# Patient Record
Sex: Female | Born: 1935 | Race: White | Hispanic: No | Smoking: Never smoker
Health system: Southern US, Community
[De-identification: ages and names within clinical notes are randomized; demographics above are authoritative.]

## PROBLEM LIST (undated history)

## (undated) DIAGNOSIS — M8580 Other specified disorders of bone density and structure, unspecified site: Secondary | ICD-10-CM

## (undated) DIAGNOSIS — C4491 Basal cell carcinoma of skin, unspecified: Secondary | ICD-10-CM

## (undated) DIAGNOSIS — C44621 Squamous cell carcinoma of skin of unspecified upper limb, including shoulder: Secondary | ICD-10-CM

## (undated) DIAGNOSIS — M179 Osteoarthritis of knee, unspecified: Secondary | ICD-10-CM

## (undated) DIAGNOSIS — R011 Cardiac murmur, unspecified: Secondary | ICD-10-CM

## (undated) DIAGNOSIS — I350 Nonrheumatic aortic (valve) stenosis: Secondary | ICD-10-CM

## (undated) DIAGNOSIS — I10 Essential (primary) hypertension: Secondary | ICD-10-CM

## (undated) DIAGNOSIS — C50911 Malignant neoplasm of unspecified site of right female breast: Secondary | ICD-10-CM

## (undated) DIAGNOSIS — E039 Hypothyroidism, unspecified: Secondary | ICD-10-CM

## (undated) DIAGNOSIS — B0229 Other postherpetic nervous system involvement: Secondary | ICD-10-CM

## (undated) DIAGNOSIS — E785 Hyperlipidemia, unspecified: Secondary | ICD-10-CM

## (undated) DIAGNOSIS — K219 Gastro-esophageal reflux disease without esophagitis: Secondary | ICD-10-CM

## (undated) DIAGNOSIS — G459 Transient cerebral ischemic attack, unspecified: Secondary | ICD-10-CM

## (undated) DIAGNOSIS — R569 Unspecified convulsions: Secondary | ICD-10-CM

## (undated) DIAGNOSIS — M1710 Unilateral primary osteoarthritis, unspecified knee: Secondary | ICD-10-CM

## (undated) HISTORY — PX: BREAST LUMPECTOMY: SHX2

## (undated) HISTORY — DX: Other postherpetic nervous system involvement: B02.29

## (undated) HISTORY — DX: Unilateral primary osteoarthritis, unspecified knee: M17.10

## (undated) HISTORY — DX: Transient cerebral ischemic attack, unspecified: G45.9

## (undated) HISTORY — PX: CATARACT EXTRACTION W/ INTRAOCULAR LENS  IMPLANT, BILATERAL: SHX1307

## (undated) HISTORY — DX: Hypothyroidism, unspecified: E03.9

## (undated) HISTORY — DX: Nonrheumatic aortic (valve) stenosis: I35.0

## (undated) HISTORY — DX: Hyperlipidemia, unspecified: E78.5

## (undated) HISTORY — DX: Unspecified convulsions: R56.9

## (undated) HISTORY — PX: CARDIAC CATHETERIZATION: SHX172

## (undated) HISTORY — DX: Other specified disorders of bone density and structure, unspecified site: M85.80

## (undated) HISTORY — PX: BASAL CELL CARCINOMA EXCISION: SHX1214

## (undated) HISTORY — DX: Gastro-esophageal reflux disease without esophagitis: K21.9

## (undated) HISTORY — PX: CARDIAC VALVE REPLACEMENT: SHX585

## (undated) HISTORY — DX: Osteoarthritis of knee, unspecified: M17.9

## (undated) HISTORY — PX: TONSILLECTOMY: SUR1361

---

## 2002-06-23 DIAGNOSIS — C50911 Malignant neoplasm of unspecified site of right female breast: Secondary | ICD-10-CM

## 2002-06-23 HISTORY — PX: BREAST BIOPSY: SHX20

## 2002-06-23 HISTORY — DX: Malignant neoplasm of unspecified site of right female breast: C50.911

## 2005-06-23 DIAGNOSIS — R569 Unspecified convulsions: Secondary | ICD-10-CM

## 2005-06-23 DIAGNOSIS — G459 Transient cerebral ischemic attack, unspecified: Secondary | ICD-10-CM

## 2005-06-23 HISTORY — DX: Unspecified convulsions: R56.9

## 2005-06-23 HISTORY — DX: Transient cerebral ischemic attack, unspecified: G45.9

## 2011-10-30 ENCOUNTER — Institutional Professional Consult (permissible substitution): Payer: Self-pay | Admitting: Cardiology

## 2013-05-15 ENCOUNTER — Encounter (HOSPITAL_COMMUNITY): Payer: Self-pay | Admitting: Emergency Medicine

## 2013-05-15 ENCOUNTER — Emergency Department (HOSPITAL_COMMUNITY): Payer: MEDICARE

## 2013-05-15 ENCOUNTER — Emergency Department (HOSPITAL_COMMUNITY)
Admission: EM | Admit: 2013-05-15 | Discharge: 2013-05-15 | Disposition: A | Discharge status: Home / Self Care | Payer: MEDICARE | Attending: Emergency Medicine | Admitting: Emergency Medicine

## 2013-05-15 DIAGNOSIS — Z7982 Long term (current) use of aspirin: Secondary | ICD-10-CM | POA: Insufficient documentation

## 2013-05-15 DIAGNOSIS — IMO0001 Reserved for inherently not codable concepts without codable children: Secondary | ICD-10-CM | POA: Insufficient documentation

## 2013-05-15 DIAGNOSIS — Z79899 Other long term (current) drug therapy: Secondary | ICD-10-CM | POA: Insufficient documentation

## 2013-05-15 DIAGNOSIS — B0229 Other postherpetic nervous system involvement: Secondary | ICD-10-CM | POA: Insufficient documentation

## 2013-05-15 DIAGNOSIS — R011 Cardiac murmur, unspecified: Secondary | ICD-10-CM | POA: Insufficient documentation

## 2013-05-15 DIAGNOSIS — Z791 Long term (current) use of non-steroidal anti-inflammatories (NSAID): Secondary | ICD-10-CM | POA: Insufficient documentation

## 2013-05-15 DIAGNOSIS — Z880 Allergy status to penicillin: Secondary | ICD-10-CM | POA: Insufficient documentation

## 2013-05-15 DIAGNOSIS — M79609 Pain in unspecified limb: Secondary | ICD-10-CM

## 2013-05-15 LAB — CBC WITH DIFFERENTIAL/PLATELET
Basophils Absolute: 0 10*3/uL (ref 0.0–0.1)
Basophils Relative: 0 % (ref 0–1)
Eosinophils Absolute: 0.1 10*3/uL (ref 0.0–0.7)
Eosinophils Relative: 1 % (ref 0–5)
HCT: 36.8 % (ref 36.0–46.0)
Hemoglobin: 12.2 g/dL (ref 12.0–15.0)
Lymphocytes Relative: 10 % — ABNORMAL LOW (ref 12–46)
Lymphs Abs: 1 10*3/uL (ref 0.7–4.0)
MCH: 31.6 pg (ref 26.0–34.0)
MCHC: 33.2 g/dL (ref 30.0–36.0)
MCV: 95.3 fL (ref 78.0–100.0)
Monocytes Absolute: 0.7 10*3/uL (ref 0.1–1.0)
Monocytes Relative: 7 % (ref 3–12)
Neutro Abs: 7.6 10*3/uL (ref 1.7–7.7)
Neutrophils Relative %: 81 % — ABNORMAL HIGH (ref 43–77)
Platelets: 306 10*3/uL (ref 150–400)
RBC: 3.86 MIL/uL — ABNORMAL LOW (ref 3.87–5.11)
RDW: 13.3 % (ref 11.5–15.5)
WBC: 9.3 10*3/uL (ref 4.0–10.5)

## 2013-05-15 LAB — URINALYSIS, ROUTINE W REFLEX MICROSCOPIC
Bilirubin Urine: NEGATIVE
Glucose, UA: NEGATIVE mg/dL
Hgb urine dipstick: NEGATIVE
Ketones, ur: NEGATIVE mg/dL
Leukocytes, UA: NEGATIVE
Nitrite: NEGATIVE
Protein, ur: NEGATIVE mg/dL
Specific Gravity, Urine: 1.026 (ref 1.005–1.030)
Urobilinogen, UA: 0.2 mg/dL (ref 0.0–1.0)
pH: 5.5 (ref 5.0–8.0)

## 2013-05-15 LAB — COMPREHENSIVE METABOLIC PANEL
ALT: 19 U/L (ref 0–35)
AST: 23 U/L (ref 0–37)
BUN: 25 mg/dL — ABNORMAL HIGH (ref 6–23)
CO2: 25 mEq/L (ref 19–32)
Calcium: 9.9 mg/dL (ref 8.4–10.5)
Chloride: 103 mEq/L (ref 96–112)
Creatinine, Ser: 0.99 mg/dL (ref 0.50–1.10)
GFR calc Af Amer: 61 mL/min — ABNORMAL LOW (ref >90)
GFR calc non Af Amer: 52 mL/min — ABNORMAL LOW (ref >90)
Glucose, Bld: 113 mg/dL — ABNORMAL HIGH (ref 70–99)
Sodium: 139 mEq/L (ref 135–145)
Total Bilirubin: 0.6 mg/dL (ref 0.3–1.2)

## 2013-05-15 LAB — CK: Total CK: 234 U/L — ABNORMAL HIGH (ref 7–177)

## 2013-05-15 MED ORDER — HYDROCODONE-ACETAMINOPHEN 5-325 MG PO TABS
1.0000 | ORAL_TABLET | Freq: Once | ORAL | Status: AC
  Administered 2013-05-15: 1 via ORAL
  Filled 2013-05-15: qty 1

## 2013-05-15 MED ORDER — GABAPENTIN 300 MG PO CAPS
300.0000 mg | ORAL_CAPSULE | Freq: Once | ORAL | Status: AC
  Administered 2013-05-15: 300 mg via ORAL
  Filled 2013-05-15: qty 1

## 2013-05-15 MED ORDER — PREGABALIN 50 MG PO CAPS: 50.0000 mg | ORAL_CAPSULE | Freq: Three times a day (TID) | ORAL | Status: DC

## 2013-05-15 MED ORDER — HYDROCODONE-ACETAMINOPHEN 5-325 MG PO TABS: 1.0000 | ORAL_TABLET | ORAL | Status: DC | PRN

## 2013-05-15 NOTE — Progress Notes (Signed)
VASCULAR LAB PRELIMINARY  PRELIMINARY  PRELIMINARY  PRELIMINARY  Left lower extremity venous Doppler completed.    Preliminary report:  There is no DVT or SVT noted in the left lower extremity.  Glade Strausser, RVT 05/15/2013, 10:30 AM

## 2013-05-15 NOTE — ED Notes (Signed)
Pt reports left leg pain 10/10 upon movement. Pain mostly in left upper thigh area. Denies injury, chest pain, sob.

## 2013-05-15 NOTE — ED Provider Notes (Signed)
CSN: [id number]     Arrival date & time 05/15/13  0830 History   First MD Initiated Contact with Patient 05/15/13 [phone]     Chief Complaint  Patient presents with  . Leg Pain   (Consider location/radiation/quality/duration/timing/severity/associated sxs/prior Treatment) HPI Comments: Patient presents with pain in her left anterior thigh and what appears to be a dermatomal distribution. This pain has been ongoing for the past 4 days and worsening. She denies any falls or trauma. No fevers, chills, nausea or vomiting. She reports pain is in the same location where she had zoster in 2007. Pain today so severe that she is having a hard time walking. Denies weakness, numbness or tingling. She did not take anything at home for pain. She denies a back pain. She also travel to [place] by plane 1 week ago. She has a remote history of breast cancer. Denies any chest pain or shortness of breath.  Had a fall last month and spent several weeks in rehab in [place].   The history is provided by the patient.    History reviewed. No pertinent past medical history. History reviewed. No pertinent past surgical history. No family history on file. History  Substance Use Topics  . Smoking status: Never Smoker   . Smokeless tobacco: Not on file  . Alcohol Use: No   OB History   Grav Para Term Preterm Abortions TAB SAB Ect Mult Living                 Review of Systems  Constitutional: Negative for fever, activity change and appetite change.  Respiratory: Negative for cough, chest tightness and shortness of breath.   Cardiovascular: Negative for chest pain.  Gastrointestinal: Negative for nausea, vomiting and abdominal pain.  Genitourinary: Negative for dysuria and hematuria.  Musculoskeletal: Positive for arthralgias and myalgias. Negative for back pain.  Skin: Negative for rash and wound.  Neurological: Negative for dizziness, weakness and headaches.  A complete 10 system review of systems was  obtained and all systems are negative except as noted in the HPI and PMH.    Allergies  Amoxicillin and Penicillins  Home Medications   Current Outpatient Rx  Name  Route  Sig  Dispense  Refill  . aspirin 325 MG tablet   Oral   Take 325 mg by mouth daily.         . Biotin 5000 MCG CAPS   Oral   Take 5,000 mcg by mouth daily.         . Calcium Carbonate-Vitamin D (CALCIUM-VITAMIN D) 500-200 MG-UNIT per tablet   Oral   Take 1 tablet by mouth daily.         . cholecalciferol (VITAMIN D) 1000 UNITS tablet   Oral   Take 2,000 Units by mouth daily.         Marland Kitchen levETIRAcetam (KEPPRA) 500 MG tablet   Oral   Take 500 mg by mouth daily.         Marland Kitchen levothyroxine (SYNTHROID, LEVOTHROID) 50 MCG tablet   Oral   Take 50 mcg by mouth daily before breakfast.         . lisinopril (PRINIVIL,ZESTRIL) 2.5 MG tablet   Oral   Take 2.5 mg by mouth daily.         . meloxicam (MOBIC) 7.5 MG tablet   Oral   Take 7.5 mg by mouth daily.         . naproxen sodium (ANAPROX) 220 MG tablet   Oral  Take 440 mg by mouth 2 (two) times daily as needed (pain).         Marland Kitchen omeprazole (PRILOSEC) 20 MG capsule   Oral   Take 20 mg by mouth daily.         Marland Kitchen HYDROcodone-acetaminophen (NORCO/VICODIN) 5-325 MG per tablet   Oral   Take 1 tablet by mouth every 4 (four) hours as needed for moderate pain.   20 tablet   0   . pregabalin (LYRICA) 50 MG capsule   Oral   Take 1 capsule (50 mg total) by mouth 3 (three) times daily.   60 capsule   0    BP 137/67  Pulse 86  Temp(Src) 97.7 F (36.5 C) (Oral)  Resp 15  SpO2 100% Physical Exam  Constitutional: She is oriented to person, place, and time. She appears well-developed and well-nourished. No distress.  HENT:  Head: Normocephalic and atraumatic.  Mouth/Throat: Oropharynx is clear and moist. No oropharyngeal exudate.  Eyes: Conjunctivae and EOM are normal. Pupils are equal, round, and reactive to light.  Neck: Normal range of  motion. Neck supple.  Cardiovascular: Normal rate and regular rhythm.   Murmur heard. Systolic murmur  Pulmonary/Chest: Effort normal and breath sounds normal. No respiratory distress.  Abdominal: Soft. There is no tenderness. There is no rebound and no guarding.  Musculoskeletal: Normal range of motion. She exhibits no edema and no tenderness.  Left anterior thigh appears normal with no rashes or skin changes. No bony tenderness. Full range of motion of hip and knee. Intact DP pulses bilaterally. No calf swelling. No Homans sign  Neurological: She is alert and oriented to person, place, and time. No cranial nerve deficit. She exhibits normal muscle tone. Coordination normal.  Skin: Skin is warm.    ED Course  Procedures (including critical care time) Labs Review Labs Reviewed  CBC WITH DIFFERENTIAL - Abnormal; Notable for the following:    RBC 3.86 (*)    Neutrophils Relative % 81 (*)    Lymphocytes Relative 10 (*)    All other components within normal limits  COMPREHENSIVE METABOLIC PANEL - Abnormal; Notable for the following:    Glucose, Bld 113 (*)    BUN 25 (*)    GFR calc non Af Amer 52 (*)    GFR calc Af Amer 61 (*)    All other components within normal limits  CK - Abnormal; Notable for the following:    Total CK 234 (*)    All other components within normal limits  URINALYSIS, ROUTINE W REFLEX MICROSCOPIC   Imaging Review Dg Hip Complete Left  05/15/2013   CLINICAL DATA:  Left-sided hip pain.  EXAM: LEFT HIP - COMPLETE 2+ VIEW  COMPARISON:  No priors.  FINDINGS: There is some cortical thickening and irregularity along the medial aspect of the left femoral neck, which could suggest sequela of old healed nondisplaced left femoral neck fracture. No evidence to suggest an acute displaced fracture of the visualize left proximal femur or bony pelvis. Left femoral head is properly located. There is moderate joint space, subchondral sclerosis, subchondral cyst formation and  osteophyte formation in the hip joints bilaterally, compatible with osteoarthritis.  IMPRESSION: 1. No acute radiographic abnormality of the bony pelvis or the left hip. 2. Changes that may suggest old healed nondisplaced left femoral neck fracture, as above. 3. Moderate bilateral hip joint osteoarthritis.   Electronically Signed   By: Trudie Reed M.D.   On: 05/15/2013 10:18   Dg Femur  Left  05/15/2013   ADDENDUM REPORT: 05/15/2013 10:07  ADDENDUM: There is a voice recognition error on the original dictation. The impression #1 should read: "NO acute radiographic abnormality of the left femur."   Electronically Signed   By: Trudie Reed M.D.   On: 05/15/2013 10:07   05/15/2013   CLINICAL DATA:  Left-sided leg pain.  EXAM: LEFT FEMUR - 2 VIEW  COMPARISON:  No priors.  FINDINGS: AP and lateral views of the left femur demonstrate no definite acute displaced fracture. There is some cortical irregularity and thickening along the medial margin of the left femoral, which could represent sequela of old healed nondisplaced left femoral fracture. Femoral head is properly located within the left acetabulum.  IMPRESSION: 1. Acute radiographic abnormality of the left femur. 2. Possible old healed nondisplaced left femoral neck fracture.  Electronically Signed: By: Trudie Reed M.D. On: 05/15/2013 09:50   Dg Knee Complete 4 Views Left  05/15/2013   CLINICAL DATA:  Left-sided knee pain.  EXAM: LEFT KNEE - COMPLETE 4+ VIEW  COMPARISON:  No priors.  FINDINGS: Multiple views of the left knee demonstrate no acute displaced fracture, subluxation or dislocation. There is joint space narrowing, subchondral sclerosis, subchondral cyst formation and osteophyte formation, most severe in the patellofemoral compartment. Chondrocalcinosis is noted.  IMPRESSION: 1. No acute radiographic abnormality of the left knee. 2. Chondrocalcinosis. 3. Osteoarthritis, most severe in the patellofemoral compartment.   Electronically  Signed   By: Trudie Reed M.D.   On: 05/15/2013 09:44    EKG Interpretation   None       MDM   1. Post herpetic neuralgia    Atraumatic left anterior thigh pain. Neurovascularly intact. Suspect posthepatic neuralgia. We'll obtain Doppler to rule out DVT given recent travel and cancer history. X-rays to evaluate bones.  X-rays negative for acute fractures. CK mildly elevated at 234. Renal function normal. Doppler is negative for DVT. Patient has intact distal pulses. No evidence of cauda equina syndrome, epidural abscess, cord compression.  We'll treat for posthepatic neuralgia with pain medications and lyrica.  Daughter states patient has tolerated lyrica in the past.  PCP referrals given. Return precautions discussed. Patient able to ambulate with assistance which is her baseline.  BP 137/67  Pulse 86  Temp(Src) 97.7 F (36.5 C) (Oral)  Resp 15  SpO2 100%   Glynn Octave, MD 05/15/13 1127

## 2013-05-15 NOTE — ED Notes (Signed)
Both PT pulses found with Doppler and marked.

## 2013-05-15 NOTE — ED Notes (Signed)
Pt ambulated to bathroom 

## 2013-05-27 ENCOUNTER — Ambulatory Visit (INDEPENDENT_AMBULATORY_CARE_PROVIDER_SITE_OTHER): Payer: MEDICARE | Admitting: Internal Medicine

## 2013-05-27 ENCOUNTER — Encounter: Payer: Self-pay | Admitting: Internal Medicine

## 2013-05-27 VITALS — BP 112/70 | HR 78 | Temp 97.8°F | Ht 67.0 in | Wt 179.4 lb

## 2013-05-27 DIAGNOSIS — M1710 Unilateral primary osteoarthritis, unspecified knee: Secondary | ICD-10-CM

## 2013-05-27 DIAGNOSIS — E785 Hyperlipidemia, unspecified: Secondary | ICD-10-CM

## 2013-05-27 DIAGNOSIS — N318 Other neuromuscular dysfunction of bladder: Secondary | ICD-10-CM

## 2013-05-27 DIAGNOSIS — I350 Nonrheumatic aortic (valve) stenosis: Secondary | ICD-10-CM | POA: Insufficient documentation

## 2013-05-27 DIAGNOSIS — I359 Nonrheumatic aortic valve disorder, unspecified: Secondary | ICD-10-CM

## 2013-05-27 DIAGNOSIS — N3281 Overactive bladder: Secondary | ICD-10-CM

## 2013-05-27 DIAGNOSIS — M179 Osteoarthritis of knee, unspecified: Secondary | ICD-10-CM

## 2013-05-27 DIAGNOSIS — B0229 Other postherpetic nervous system involvement: Secondary | ICD-10-CM

## 2013-05-27 DIAGNOSIS — K219 Gastro-esophageal reflux disease without esophagitis: Secondary | ICD-10-CM

## 2013-05-27 DIAGNOSIS — E039 Hypothyroidism, unspecified: Secondary | ICD-10-CM

## 2013-05-27 MED ORDER — TOLTERODINE TARTRATE ER 2 MG PO CP24: 2.0000 mg | ORAL_CAPSULE | Freq: Every day | ORAL | Status: DC

## 2013-05-27 MED ORDER — SIMVASTATIN 10 MG PO TABS: 10.0000 mg | ORAL_TABLET | Freq: Every day | ORAL | Status: DC

## 2013-05-27 NOTE — Assessment & Plan Note (Signed)
On simvastatin 40 mg daily until episode of traumatic rhabdomyolysis October 2014. Recheck lipids and CPK level now. Consider resuming some dose depending on lab results given history of TIA and need for aggressive risk reduction

## 2013-05-27 NOTE — Patient Instructions (Signed)
It was good to see you today.  We have reviewed your prior records including labs and tests today  Medications reviewed and updated:  extended release of low-dose Detrol LA once daily for bladder symptoms - no other changes recommended at this time.  Your prescription(s) have been submitted to your pharmacy. Please take as directed and contact our office if you believe you are having problem(s) with the medication(s).  Test(s) ordered today. Return fasting net week. Your results will be released to MyChart (or called to you) after review, usually within 72hours after test completion. If any changes need to be made, you will be notified at that same time.  we'll make referral for home health therapy. Our office will contact you regarding appointment(s) once made.  Please schedule followup in 3-4 months, call sooner if problems.

## 2013-05-27 NOTE — Progress Notes (Signed)
Subjective:    Patient ID: Betty Chan, female    DOB: 1930/06/21, 79 y.o.   MRN: [id number]  HPI  New patient to me, here to establish care with PCP Moved to [facility] from [place] November 2014 to be close to family  Reviewed chronic medical issues and concerns today:  Failure to thrive. Onset of generalized weakness manifest by accidental fall early October 2014. Subsequent rhabdomyolysis resulting from a fall and retained position greater than 9 hours. Hospitalization and subsequent skilled nursing facility x1 month in [place]. Moved to [place] after release. Now in independent living facility. Requests evaluation for home health therapy.   OA, knee - Ongoing sports medicine treatment for right knee osteoarthritis with steroid injection. Improved for 3 weeks. Now planning Synvisc series. Gait and balance affected by same. Denies change in pain or increased swelling  Hypothyroidism -the patient reports compliance with medication(s) as prescribed. Denies adverse side effects.  Dyslipidemia. Previously on simvastatin 40 mg, none since rhabdomyolysis following trauma October 2014.?Need to resume the same given prior TIA.  Aortic stenosis. Follows at [facility] cardiology for same. Ongoing consideration for replacement, but no plans scheduled at this time  ?Treatment for overactive bladder  Past Medical History  Diagnosis Date  . Aortic stenosis     follows at [facility] cards Regino Schultze)  . TIA (transient ischemic attack)   . Breast cancer 2004    lumpectomy  . Seizure 2007    single event  . OA (osteoarthritis) of knee   . Hypothyroid   . Dyslipidemia   . PHN (postherpetic neuralgia)     L anterior thigh 2007, recurrent 04/2013  . GERD (gastroesophageal reflux disease)    Family History  Problem Relation Age of Onset  . Prostate cancer Father   . Breast cancer Sister    History  Substance Use Topics  . Smoking status: Never Smoker   . Smokeless tobacco: Not on file   Comment: widowed, lives in [facility]   . Alcohol Use: Yes    Review of Systems  Constitutional: Negative for fatigue and unexpected weight change.  Respiratory: Negative for cough, shortness of breath and wheezing.   Cardiovascular: Negative for chest pain, palpitations and leg swelling.  Gastrointestinal: Negative for nausea, abdominal pain and diarrhea.  Genitourinary: Positive for urgency and frequency. Negative for dysuria, hematuria and pelvic pain.  Neurological: Positive for seizures (hx x 1, no recurrence since 2007) and weakness (generalized). Negative for dizziness, facial asymmetry, light-headedness, numbness and headaches.  Psychiatric/Behavioral: Negative for dysphoric mood. The patient is not nervous/anxious.   All other systems reviewed and are negative.       Objective:   Physical Exam  BP 112/70  Pulse 78  Temp(Src) 97.8 F (36.6 C) (Oral)  Ht 5\' 7"  (1.702 m)  Wt 179 lb 6.4 oz (81.375 kg)  BMI 28.09 kg/m2  SpO2 99% Wt Readings from Last 3 Encounters:  05/27/13 179 lb 6.4 oz (81.375 kg)   Constitutional: She appears well-developed and well-nourished. No distress. Dtr at side HENT: Head: Normocephalic and atraumatic. Ears: B TMs ok, no erythema or effusion; Nose: Nose normal. Mouth/Throat: Oropharynx is clear and moist. No oropharyngeal exudate.  Eyes: Conjunctivae and EOM are normal. Pupils are equal, round, and reactive to light. No scleral icterus.  Neck: Normal range of motion. Neck supple. No JVD present. No thyromegaly present.  Cardiovascular: Normal rate, regular rhythm and normal heart sounds. 4/6 systolic murmur heard. No BLE edema. Pulmonary/Chest: Effort normal and breath sounds normal.  No respiratory distress. She has no wheezes.  Abdominal: Soft. Bowel sounds are normal. She exhibits no distension. There is no tenderness. no masses Musculoskeletal: Normal range of motion, no joint effusions. No gross deformities Neurological: She has soft and  speech but no dysarthria. Mild shuffling and careful gait. she is alert and oriented to person, place, and time. No cranial nerve deficit. Coordination, balance, strength, speech and gait are grossly normal.  Skin: Skin is warm and dry. No rash noted. No erythema.  Psychiatric: She has a normal mood and affect. Her behavior is normal. Judgment and thought content normal.   Lab Results  Component Value Date   WBC 9.3 05/15/2013   HGB 12.2 05/15/2013   HCT 36.8 05/15/2013   PLT 306 05/15/2013   GLUCOSE 113* 05/15/2013   ALT 19 05/15/2013   AST 23 05/15/2013   NA 139 05/15/2013   K 4.0 05/15/2013   CL 103 05/15/2013   CREATININE 0.99 05/15/2013   BUN 25* 05/15/2013   CO2 25 05/15/2013        Assessment & Plan:   See problem list. Medications and labs reviewed today.  Time spent with pt/family today 60 minutes, greater than 50% time spent counseling patient on FTT since fall 03/2013 and move to [place], AS, dyslipidemia, recent emergency room visit for postherpetic neuralgia and medication review. Also review of prior records as brought from [place] by daughter today  Refer for home health therapy

## 2013-05-27 NOTE — Assessment & Plan Note (Signed)
Ongoing tx by Betty Chan sports medicine at [facility]. Takes daily low-dose meloxicam for same Steroid injection November 2014 with 3 weeks relief. Now planning Synvisc series Interval history reviewed, no changes recommended

## 2013-05-27 NOTE — Assessment & Plan Note (Signed)
Family reports consideration for replacement, on hold given frailty and failure to thrive. Also remains minimally symptomatic ( dyspnea on exertion) Defer ongoing treatment recommendations as per cardiology.

## 2013-05-27 NOTE — Assessment & Plan Note (Signed)
On replacement for years Check TSH, adjust dose as needed No results found for this basename: TSH

## 2013-05-27 NOTE — Assessment & Plan Note (Signed)
Check urinalysis Begin low-dose Detrol -erx done we reviewed potential risk/benefit and possible side effects - pt understands and agrees to same

## 2013-05-30 ENCOUNTER — Other Ambulatory Visit (INDEPENDENT_AMBULATORY_CARE_PROVIDER_SITE_OTHER): Payer: MEDICARE

## 2013-05-30 DIAGNOSIS — B0229 Other postherpetic nervous system involvement: Secondary | ICD-10-CM

## 2013-05-30 DIAGNOSIS — K219 Gastro-esophageal reflux disease without esophagitis: Secondary | ICD-10-CM

## 2013-05-30 DIAGNOSIS — E039 Hypothyroidism, unspecified: Secondary | ICD-10-CM

## 2013-05-30 DIAGNOSIS — E785 Hyperlipidemia, unspecified: Secondary | ICD-10-CM

## 2013-05-30 DIAGNOSIS — N3281 Overactive bladder: Secondary | ICD-10-CM

## 2013-05-30 DIAGNOSIS — N318 Other neuromuscular dysfunction of bladder: Secondary | ICD-10-CM

## 2013-05-30 DIAGNOSIS — M1710 Unilateral primary osteoarthritis, unspecified knee: Secondary | ICD-10-CM

## 2013-05-30 LAB — HEPATIC FUNCTION PANEL
Bilirubin, Direct: 0.2 mg/dL (ref 0.0–0.3)
Total Bilirubin: 0.9 mg/dL (ref 0.3–1.2)
Total Protein: 7.2 g/dL (ref 6.0–8.3)

## 2013-05-30 LAB — LIPID PANEL
HDL: 57.2 mg/dL (ref >39.00)
Total CHOL/HDL Ratio: 4
VLDL: 15.2 mg/dL (ref 0.0–40.0)

## 2013-05-30 LAB — LDL CHOLESTEROL, DIRECT: Direct LDL: 155.6 mg/dL

## 2013-05-30 LAB — TSH: TSH: 2.68 u[IU]/mL (ref 0.35–5.50)

## 2013-06-07 ENCOUNTER — Telehealth: Payer: Self-pay | Admitting: *Deleted

## 2013-06-07 NOTE — Telephone Encounter (Signed)
Verbal ok. thanks

## 2013-06-07 NOTE — Telephone Encounter (Signed)
Debbie from [place] called wanting a verbal order for extension of PT for patient: once a week for one week and then twice a week for five weeks to work on balance, strengthening and patient education. Eunice Blase can be reached at [phone].

## 2013-06-08 NOTE — Telephone Encounter (Signed)
Called Betty Chan no answer LMOM md response...Betty Chan

## 2013-07-04 ENCOUNTER — Encounter: Payer: Self-pay | Admitting: Family

## 2013-07-04 ENCOUNTER — Ambulatory Visit (INDEPENDENT_AMBULATORY_CARE_PROVIDER_SITE_OTHER): Payer: MEDICARE | Admitting: Family

## 2013-07-04 VITALS — BP 116/68 | HR 113 | Temp 97.6°F | Wt 195.0 lb

## 2013-07-04 DIAGNOSIS — R05 Cough: Secondary | ICD-10-CM

## 2013-07-04 DIAGNOSIS — R059 Cough, unspecified: Secondary | ICD-10-CM

## 2013-07-04 DIAGNOSIS — J209 Acute bronchitis, unspecified: Secondary | ICD-10-CM

## 2013-07-04 MED ORDER — METHYLPREDNISOLONE 4 MG PO KIT: PACK | ORAL | Status: DC

## 2013-07-04 MED ORDER — METHYLPREDNISOLONE 4 MG PO KIT: PACK | ORAL | Status: AC

## 2013-07-04 NOTE — Progress Notes (Signed)
Subjective:    Patient ID: Betty Chan, female    DOB: [birth year], 80 y.o.   MRN: [id number]  HPI   80 year old caucasian female, nonsmoker presents for SOB, cough, nasal congestion, and wheezing.  Symptoms present x 1 week.  She lives in long-term facility, and is having walking from bed to chair without becoming SOB.  Has taken cough suppressantt, decongestant, and nasal drops but symptoms still present. Denies sinus pressure.     Review of Systems  Constitutional: Negative.   HENT: Positive for congestion.   Eyes: Negative.   Respiratory: Positive for cough, shortness of breath and wheezing.   Cardiovascular: Negative.   Gastrointestinal: Negative.   Endocrine: Negative.   Genitourinary: Negative.   Musculoskeletal: Negative.   Skin: Negative.   Allergic/Immunologic: Negative.   Neurological: Negative.   Hematological: Negative.   Psychiatric/Behavioral: Negative.    Past Medical History  Diagnosis Date  . Aortic stenosis     follows at [facility] cards Mina Marble)  . TIA (transient ischemic attack)   . Breast cancer 2004    lumpectomy  . Seizure 2007    single event  . OA (osteoarthritis) of knee   . Hypothyroid   . Dyslipidemia   . PHN (postherpetic neuralgia)     L anterior thigh 2007, recurrent 04/2013  . GERD (gastroesophageal reflux disease)     History   Social History  . Marital Status: Widowed    Spouse Name: N/A    Number of Children: N/A  . Years of Education: N/A   Occupational History  . Not on file.   Social History Main Topics  . Smoking status: Never Smoker   . Smokeless tobacco: Not on file     Comment: widowed, lives in [facility] living   . Alcohol Use: Yes  . Drug Use: No  . Sexual Activity: Not on file   Other Topics Concern  . Not on file   Social History Narrative  . No narrative on file    Past Surgical History  Procedure Laterality Date  . Breast biopsy  2004    lumpectomy    Family History  Problem Relation Age of Onset    . Prostate cancer Father   . Breast cancer Sister     Allergies  Allergen Reactions  . Amoxicillin Rash  . Penicillins Rash    Current Outpatient Prescriptions on File Prior to Visit  Medication Sig Dispense Refill  . aspirin 325 MG tablet Take 325 mg by mouth daily.      . Biotin 5000 MCG CAPS Take 5,000 mcg by mouth daily.      . Calcium Carbonate-Vitamin D (CALCIUM-VITAMIN D) 500-200 MG-UNIT per tablet Take 1 tablet by mouth daily.      . cholecalciferol (VITAMIN D) 1000 UNITS tablet Take 2,000 Units by mouth daily.      Marland Kitchen levETIRAcetam (KEPPRA) 500 MG tablet Take 500 mg by mouth daily.      Marland Kitchen levothyroxine (SYNTHROID, LEVOTHROID) 50 MCG tablet Take 50 mcg by mouth daily before breakfast.      . lisinopril (PRINIVIL,ZESTRIL) 5 MG tablet Take 5 mg by mouth daily.      . meloxicam (MOBIC) 7.5 MG tablet Take 7.5 mg by mouth daily.      . naproxen sodium (ANAPROX) 220 MG tablet Take 440 mg by mouth 2 (two) times daily as needed (pain).      Marland Kitchen omeprazole (PRILOSEC) 20 MG capsule Take 20 mg by mouth daily.      Marland Kitchen  pregabalin (LYRICA) 50 MG capsule Take 1 capsule (50 mg total) by mouth 3 (three) times daily.  60 capsule  0  . simvastatin (ZOCOR) 10 MG tablet Take 1 tablet (10 mg total) by mouth daily. HOLD for now (prev on 40mg )      . tolterodine (DETROL LA) 2 MG 24 hr capsule Take 1 capsule (2 mg total) by mouth daily.  30 capsule  3   No current facility-administered medications on file prior to visit.    BP 116/68  Pulse 113  Temp(Src) 97.6 F (36.4 C) (Oral)  Wt 195 lb (88.451 kg)  SpO2 97%chart    Objective:   Physical Exam  Constitutional: She is oriented to person, place, and time. She appears well-developed and well-nourished.  HENT:  Head: Normocephalic.  Right Ear: External ear normal.  Left Ear: External ear normal.  Nose: Nose normal.  Mouth/Throat: Oropharynx is clear and moist.  Nasal congestion and cough  Neck: Normal range of motion. Neck supple.   Cardiovascular: Normal rate, regular rhythm and normal heart sounds.   Pulmonary/Chest: Effort normal. She has wheezes.  Wheezing in left lower lobe and SOB on exertion  Neurological: She is alert and oriented to person, place, and time.  Skin: Skin is warm and dry.  Psychiatric: She has a normal mood and affect.          Assessment & Plan:  Assessment 1. Bronchitis 2. Cough  Plan 1. Ventolin 2 puffs every six hours for 7 days. 2.Medrol Dosepak  21 tabs.  3. Encourage plenty of fluids. 4.Contact office if problems persist or worsen.

## 2013-07-04 NOTE — Patient Instructions (Signed)

## 2013-09-06 HISTORY — PX: TRANSCATHETER AORTIC VALVE REPLACEMENT, TRANSFEMORAL: SHX6400

## 2013-09-07 DIAGNOSIS — Z952 Presence of prosthetic heart valve: Secondary | ICD-10-CM | POA: Insufficient documentation

## 2013-09-14 ENCOUNTER — Telehealth: Payer: Self-pay | Admitting: Internal Medicine

## 2013-09-14 NOTE — Telephone Encounter (Signed)
Pt was in [place] for four days and came home Saturday night.  She had a heart valve replacement.  She was told to see her pcp this week or next week.  The daughter wants to know if she can be worked in.

## 2013-09-14 NOTE — Telephone Encounter (Signed)
Ok to work in - prefer 30" if possible

## 2013-09-15 ENCOUNTER — Telehealth: Payer: Self-pay | Admitting: *Deleted

## 2013-09-15 NOTE — Telephone Encounter (Signed)
Verbal ok?

## 2013-09-15 NOTE — Telephone Encounter (Signed)
Scheduled on March 30.

## 2013-09-15 NOTE — Telephone Encounter (Signed)
Left detailed message on Kate's identified VM.

## 2013-09-15 NOTE — Telephone Encounter (Signed)
Betty Chan, PT, called requesting PT twice a week for 8 weeks.  Please advise

## 2013-09-19 ENCOUNTER — Encounter: Payer: Self-pay | Admitting: Internal Medicine

## 2013-09-19 ENCOUNTER — Ambulatory Visit (INDEPENDENT_AMBULATORY_CARE_PROVIDER_SITE_OTHER): Payer: MEDICARE | Admitting: Internal Medicine

## 2013-09-19 VITALS — BP 108/60 | HR 82 | Temp 98.7°F | Wt 183.0 lb

## 2013-09-19 DIAGNOSIS — I350 Nonrheumatic aortic (valve) stenosis: Secondary | ICD-10-CM

## 2013-09-19 DIAGNOSIS — R569 Unspecified convulsions: Secondary | ICD-10-CM

## 2013-09-19 DIAGNOSIS — H539 Unspecified visual disturbance: Secondary | ICD-10-CM

## 2013-09-19 DIAGNOSIS — E785 Hyperlipidemia, unspecified: Secondary | ICD-10-CM

## 2013-09-19 DIAGNOSIS — I359 Nonrheumatic aortic valve disorder, unspecified: Secondary | ICD-10-CM

## 2013-09-19 NOTE — Progress Notes (Signed)
Pre visit review using our clinic review tool, if applicable. No additional management support is needed unless otherwise documented below in the visit note. 

## 2013-09-19 NOTE — Progress Notes (Signed)
Subjective:    Patient ID: Betty Chan, female    DOB: [birth year], 80 y.o.   MRN: [id number]  HPI   Patient seen for post hospital follow up. Recently discharged from [facility] post TAVR.  Pt reports significant improvement in breathing and also energy level since procedure.  No groin problems.  Mobic and Naproxen discontinued at time of procedure due to initiation of Plavix - Tramadol Rx'd instead for knee pain.   Daughter requesting local referral to neurology (prior hx of seizures) and opthalmology (?cataracts).   Chronic medical issues also reviewed.  Past Medical History  Diagnosis Date  . Aortic stenosis     s/p TAVR 08/2013 -follows at [facility] cards Mina Marble)  . TIA (transient ischemic attack)   . Breast cancer 2004    lumpectomy  . Seizure 2007    single event  . OA (osteoarthritis) of knee   . Hypothyroid   . Dyslipidemia   . PHN (postherpetic neuralgia)     L anterior thigh 2007, recurrent 04/2013  . GERD (gastroesophageal reflux disease)      Review of Systems  Constitutional: Negative for fever, chills, activity change and appetite change.  Respiratory: Negative for cough, chest tightness, shortness of breath and wheezing.   Cardiovascular: Positive for leg swelling (chronic - improved). Negative for chest pain and palpitations.  Gastrointestinal: Negative for nausea, vomiting, diarrhea and constipation.  Musculoskeletal: Positive for arthralgias (knee pain).  Skin: Negative for rash and wound.  Neurological: Negative for dizziness, syncope, weakness, numbness and headaches.  Psychiatric/Behavioral: Negative for sleep disturbance. The patient is not nervous/anxious.        Objective:   Physical Exam  Vitals reviewed. Constitutional: She is oriented to person, place, and time. She appears well-developed and well-nourished. No distress.  Neck: Normal range of motion. Neck supple. No thyromegaly present.  Cardiovascular: Normal rate, regular rhythm and normal  heart sounds.   No murmur heard. Pulmonary/Chest: Effort normal and breath sounds normal. No respiratory distress. She has no wheezes. She exhibits no tenderness.  Abdominal: Soft. Bowel sounds are normal. She exhibits no distension and no mass. There is no tenderness.  Musculoskeletal: Normal range of motion. She exhibits no edema and no tenderness.  Lymphadenopathy:    She has no cervical adenopathy.  Neurological: She is alert and oriented to person, place, and time.  Skin: Skin is warm and dry. No rash noted. She is not diaphoretic.  Psychiatric: She has a normal mood and affect. Her behavior is normal. Judgment and thought content normal.    Wt Readings from Last 3 Encounters:  09/19/13 183 lb (83.008 kg)  07/04/13 195 lb (88.451 kg)  05/27/13 179 lb 6.4 oz (81.375 kg)   BP Readings from Last 3 Encounters:  09/19/13 108/60  07/04/13 116/68  05/27/13 112/70   Lab Results  Component Value Date   WBC 9.3 05/15/2013   HGB 12.2 05/15/2013   HCT 36.8 05/15/2013   PLT 306 05/15/2013   GLUCOSE 113* 05/15/2013   CHOL 226* 05/30/2013   TRIG 76.0 05/30/2013   HDL 57.20 05/30/2013   LDLDIRECT 155.6 05/30/2013   ALT 21 05/30/2013   AST 28 05/30/2013   NA 139 05/15/2013   K 4.0 05/15/2013   CL 103 05/15/2013   CREATININE 0.99 05/15/2013   BUN 25* 05/15/2013   CO2 25 05/15/2013   TSH 2.68 05/30/2013       Assessment & Plan:   Problem List Items Addressed This Visit   Aortic stenosis -  Primary     S/p TAVR 09/05/2013 at [facility] Much improved symptoms ( dyspnea on exertion) Defer additional treatment recommendations as per [facility] cardiology - follow up next week as planned     Relevant Medications      aspirin 81 MG tablet      metoprolol tartrate (LOPRESSOR) 25 MG tablet      furosemide (LASIX) 20 MG tablet   Dyslipidemia      On simvastatin 40 mg daily until episode of traumatic rhabdomyolysis October 2014. Recheck lipid 05/2013 with LDL>155 -normalized CPK level. Consider  resuming some dose depending on lab results given history of TIA and need for aggressive risk reduction - defer to [facility] cards  Lab Results  Component Value Date   CKTOTAL 197* 05/30/2013        Other Visit Diagnoses   Change in vision        Relevant Orders       Ambulatory referral to Ophthalmology    Seizures        Relevant Orders       Ambulatory referral to Neurology      History of seizure, single event in 2007 versus TIA. Has been on Keppra since that time without breakthrough events. Refer to neurology to determine if safe to discontinue Keppra at family request  Change in vision, presumed cataract from prior eval -refer to ophthalmology for further evaluation and treatment of same

## 2013-09-19 NOTE — Assessment & Plan Note (Signed)
S/p TAVR 09/05/2013 at [facility] Much improved symptoms ( dyspnea on exertion) Defer additional treatment recommendations as per [facility] cardiology - follow up next week as planned

## 2013-09-19 NOTE — Patient Instructions (Signed)
It was good to see you today.  We have reviewed your prior records including labs and tests today  Ask [facility] to check your cholesterol level and advised on need for simvastatin  Medications reviewed and updated, no changes recommended at this time.  we'll make referral to neurology to advise on Keppra and to ophthalmology to advise on cataracts . Our office will contact you regarding appointment(s) once made.  Please schedule followup in 6 months, call sooner if problems.

## 2013-09-19 NOTE — Assessment & Plan Note (Signed)
On simvastatin 40 mg daily until episode of traumatic rhabdomyolysis October 2014. Recheck lipid 05/2013 with LDL>155 -normalized CPK level. Consider resuming some dose depending on lab results given history of TIA and need for aggressive risk reduction - defer to [facility] cards  Lab Results  Component Value Date   CKTOTAL 197* 05/30/2013

## 2013-10-04 ENCOUNTER — Encounter: Payer: Self-pay | Admitting: Neurology

## 2013-10-04 ENCOUNTER — Ambulatory Visit (INDEPENDENT_AMBULATORY_CARE_PROVIDER_SITE_OTHER): Payer: MEDICARE | Admitting: Neurology

## 2013-10-04 VITALS — BP 108/62 | HR 60 | Resp 16 | Ht 67.0 in | Wt 176.0 lb

## 2013-10-04 DIAGNOSIS — R569 Unspecified convulsions: Secondary | ICD-10-CM

## 2013-10-04 NOTE — Patient Instructions (Signed)
1. Routine EEG 2. Continue Keppra 500mg  daily 3. Follow-up in 2 months

## 2013-10-04 NOTE — Progress Notes (Signed)
NEUROLOGY CONSULTATION NOTE  Betty Chan MRN: [id number] DOB: 05/29/1930  Referring provider: Dr. [name] Primary care provider: Dr. [name]  Reason for consult:  Medication management after 1 seizure in 2008  Dear Dr Asa Lente:  Thank you for your kind referral of Betty Chan for consultation of the above symptoms. Although her history is well known to you, please allow me to reiterate it for the purpose of our medical record. The patient was accompanied to the clinic by her daughter who also provides collateral information.   HISTORY OF PRESENT ILLNESS: This is a very pleasant 80 year old right-handed woman with a history of hypertension, hyperlipidemia, breast cancer, aortic stenosis s/p TAVR last month, and seizure x 1 in 2008.  Records from her neurologist Dr. Levan Hurst from [place] were reviewed.  She recalls going to 2 graduation parties and having 1 glass of wine in each party, briefly lost consciousness.  She has no recollection of the event, no prior warning symptoms.  Per records, she broke out into a cold sweat, then passed out.  When she came to, EMS was standing around her and she felt tine.  According to her husband, he was talking to her, she yawned twice and then picked up a bottle of water. She started to take a drink, dropped the bottle, didn't respond, and was staring.  She then dropped her head and sat limply in the chair for about 3 or 4 minutes.  He couldn't arouse her.  When EMS arrived, she was regaining consciousness and responded appropriately.  EMS reported her BP was low and then became normal while she was in the ambulance.  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  She had an MRI brain with and without contrast which showed mild brain volume loss with ex vacuo dilatation of CSF spaces, patchy white matter changes in both cerebral hemispheres and central pons, several small chronic-appearing lacunar infarctions in  the anterior aspects of both corona radiatae.  EEG report unavailable, but per notes it was abnormal with mild epileptiform discharges in the right temporal region.  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  She was started on Keppra and has been taking 500mg  qhs since 2008 with no further similar symptoms.  The patient denies any olfactory/gustatory hallucinations, deja vu, rising epigastric sensation, focal numbness/tingling/weakness, myoclonic jerks.  She denies any headaches, dizziness, diplopia, dysarthria, dysphagia, neck pain, bowel/bladder dysfunction.  She has occasional low back pain.  Her daughter has noticed the masked face and stare for the past few years.  They have also noticed a shuffling gait.  She uses the walker.  No tremors. She denies any anosmia, no constipation. She fell in the fall of 2014 and was stuck in the middle of her bed and the wall for several hours until someone found her.  She denied losing consciousness at that time but did hit her head.  She tells me head CT done in [place] at that time was unremarkable.  Epilepsy Risk Factors:  She had a normal birth and early development.  There is no history of febrile convulsions, CNS infections such as meningitis/encephalitis, significant traumatic brain injury, neurosurgical procedures, or family history of seizures.  PAST MEDICAL HISTORY: Past Medical History  Diagnosis Date  . Aortic stenosis     s/p TAVR 08/2013 -follows at [facility] cards Mina Marble)  . TIA (transient ischemic attack)   . Breast cancer 2004  lumpectomy  . Seizure 2007    single event  . OA (osteoarthritis) of knee   . Hypothyroid   . Dyslipidemia   . PHN (postherpetic neuralgia)     L anterior thigh 2007, recurrent 04/2013  . GERD (gastroesophageal reflux disease)     PAST SURGICAL HISTORY: Past Surgical History  Procedure Laterality Date  . Breast biopsy  2004    lumpectomy  .  Transcatheter aortic valve replacement, transfemoral  09/06/13    [facility]    MEDICATIONS: Current Outpatient Prescriptions on File Prior to Visit  Medication Sig Dispense Refill  . aspirin 81 MG tablet Take 81 mg by mouth daily.      . Biotin 5000 MCG CAPS Take 5,000 mcg by mouth daily.      . cholecalciferol (VITAMIN D) 1000 UNITS tablet Take 2,000 Units by mouth daily.      . clopidogrel (PLAVIX) 75 MG tablet Take 75 mg by mouth daily with breakfast.      . furosemide (LASIX) 20 MG tablet Take 20 mg by mouth 2 (two) times daily.      Marland Kitchen levETIRAcetam (KEPPRA) 500 MG tablet Take 500 mg by mouth daily.      Marland Kitchen levothyroxine (SYNTHROID, LEVOTHROID) 50 MCG tablet Take 50 mcg by mouth daily before breakfast.      . lisinopril (PRINIVIL,ZESTRIL) 5 MG tablet Take 5 mg by mouth daily.      . metoprolol tartrate (LOPRESSOR) 25 MG tablet Take 12.5 mg by mouth 2 (two) times daily.      Marland Kitchen omeprazole (PRILOSEC) 20 MG capsule Take 20 mg by mouth daily.      . potassium chloride SA (K-DUR,KLOR-CON) 20 MEQ tablet Take 20 mEq by mouth 2 (two) times daily.      . simvastatin (ZOCOR) 10 MG tablet Take 1 tablet (10 mg total) by mouth daily. HOLD for now (prev on 40mg )       No current facility-administered medications on file prior to visit.    ALLERGIES: Allergies  Allergen Reactions  . Amoxicillin Rash  . Penicillins Rash    FAMILY HISTORY: Family History  Problem Relation Age of Onset  . Prostate cancer Father   . Breast cancer Sister     SOCIAL HISTORY: History   Social History  . Marital Status: Widowed    Spouse Name: N/A    Number of Children: N/A  . Years of Education: N/A   Occupational History  . Not on file.   Social History Main Topics  . Smoking status: Never Smoker   . Smokeless tobacco: Not on file     Comment: widowed, lives in [place] living   . Alcohol Use: Yes  . Drug Use: No  . Sexual Activity: Not on file   Other Topics Concern  . Not on file   Social History  Narrative  . No narrative on file    REVIEW OF SYSTEMS: Constitutional: No fevers, chills, or sweats, no generalized fatigue, change in appetite Eyes: No visual changes, double vision, eye pain Ear, nose and throat: No hearing loss, ear pain, nasal congestion, sore throat Cardiovascular: No chest pain, palpitations Respiratory:  No shortness of breath at rest or with exertion, wheezes GastrointestinaI: No nausea, vomiting, diarrhea, abdominal pain, fecal incontinence Genitourinary:  No dysuria, urinary retention or frequency Musculoskeletal:  No neck pain, occl back pain Integumentary: No rash, pruritus, skin lesions Neurological: as above Psychiatric: No depression, insomnia, anxiety Endocrine: No palpitations, fatigue, diaphoresis, mood swings, change in appetite,  change in weight, increased thirst Hematologic/Lymphatic:  No anemia, purpura, petechiae. Allergic/Immunologic: no itchy/runny eyes, nasal congestion, recent allergic reactions, rashes  PHYSICAL EXAM: Filed Vitals:   10/04/13 1247  BP: 108/62  Pulse: 60  Resp: 16   General: No acute distress. Masked facies with decreased blink rate. Head:  Normocephalic/atraumatic Neck: supple, no paraspinal tenderness, full range of motion Back: No paraspinal tenderness Heart: regular rate and rhythm Lungs: Clear to auscultation bilaterally. Vascular: No carotid bruits. Skin/Extremities: No rash, no edema Neurological Exam: Mental status: alert and oriented to person, place, and time, no dysarthria or dysphagia. Fund of knowledge is appropriate.  Recent and remote memory intact.  Attention span and concentration normal.  Repeats and names without difficulty. Cranial nerves: CN I: not tested CN II: pupils equal, round and reactive to light, visual fields intact, fundi unremarkable. CN III, IV, VI:  full range of motion, no nystagmus, no ptosis CN V: facial sensation intact CN VII: upper and lower face symmetric CN VIII: hearing  intact CN IX, X: gag intact, uvula midline CN XI: sternocleidomastoid and trapezius muscles intact CN XII: tongue midline Bulk & Tone: increased in both LE particularly ankles, no cogwheeling, no fasciculations. + postural instability Motor: 5/5 throughout with no pronator drift. Sensation: decreased vibration to ankles bilaterally.  Otherwise intact to light touch, cold, pin, joint position sense.  No extinction to double simultaneous stimulation.  Romberg test negative. Deep Tendon Reflexes: +2 throughout except for absent ankle jerks, no ankle clonus Plantar responses: downgoing bilaterally Finger to nose testing: no incoordination Gait: wide-based, no ataxia, bilateral decreased arm swing Tremors: no resting, postural, or action tremor noted  IMPRESSION: This is a pleasant 80 year old right-handed woman with a history of hypertension, hyperlipidemia, aortic stenosis s/p TAVR, and one episode of loss of consciousness suggestive of syncope, however her routine EEG reported mild epileptiform discharges on the right temporal region and she was started on Keppra.  She has not had any further similar episodes or any seizure-like events since 2008.  No clear epilepsy risk factors.  They are interested in discontinuing Keppra if able.  A routine EEG will be ordered.  If normal, we will start tapering Keppra off slowly.  They understand risks of breakthrough seizure with any medication adjustments.  She is not driving.  She has been noted by family to have a shuffling gait, masked facies, and blank stare.  On exam today she does have some rigidity and postural instability, no tremors.  We will continue to monitor her symptoms and if needed, consideration for Sinemet in the future can be done.  She will follow-up in 2 months.  Thank you for allowing me to participate in the care of this patient. Please do not hesitate to call for any questions or concerns.   Ellouise Newer, M.D.

## 2013-10-07 ENCOUNTER — Telehealth: Payer: Self-pay | Admitting: Neurology

## 2013-10-07 NOTE — Telephone Encounter (Signed)
Pt wants to know if we got the records from her old dr please call [phone]

## 2013-10-18 ENCOUNTER — Other Ambulatory Visit: Payer: Self-pay | Admitting: *Deleted

## 2013-10-18 MED ORDER — LEVOTHYROXINE SODIUM 50 MCG PO TABS: 50.0000 ug | ORAL_TABLET | Freq: Every day | ORAL | Status: DC

## 2013-10-24 NOTE — Telephone Encounter (Signed)
Pt wants to know if we got the old [place] records [phone]

## 2013-11-09 DIAGNOSIS — Z853 Personal history of malignant neoplasm of breast: Secondary | ICD-10-CM | POA: Insufficient documentation

## 2013-11-21 ENCOUNTER — Ambulatory Visit (INDEPENDENT_AMBULATORY_CARE_PROVIDER_SITE_OTHER): Payer: MEDICARE | Admitting: Neurology

## 2013-11-21 DIAGNOSIS — R569 Unspecified convulsions: Secondary | ICD-10-CM

## 2013-11-24 NOTE — Procedures (Signed)
ELECTROENCEPHALOGRAM REPORT  Date of Study: 11/21/2013  Patient's Name: Betty Chan MRN: [id number] Date of Birth: 1929/10/07  Referring Provider: Dr. Ellouise Newer  Clinical History:This is a pleasant 80 year old right-handed woman with a history of hypertension, hyperlipidemia, aortic stenosis s/p TAVR, and one episode of loss of consciousness suggestive of syncope, however her routine EEG reported mild epileptiform discharges on the right temporal region and she was started on Keppra. She has not had any further similar episodes or any seizure-like events since 2008   Medications: Keppra, aspirin, Plavix, simvastatin, metoprolol, Synthroid  Technical Summary: A multichannel digital EEG recording measured by the international 10-20 system with electrodes applied with paste and impedances below 5000 ohms performed in our laboratory with EKG monitoring in an awake and drowsy patient.  Hyperventilation was not performed. Photic stimulation was performed.  The digital EEG was referentially recorded, reformatted, and digitally filtered in a variety of bipolar and referential montages for optimal display.  Spike detection software was employed.  Description: The patient is awake and drowsy during the recording.  During maximal wakefulness, there is a symmetric, medium voltage 10 Hz posterior dominant rhythm that attenuates with eye opening.  During drowsiness, there is an increase in theta slowing of the background, with shifting asymmetry seen over the bilateral temporal regions, at times sharply contoured without clear epileptogenic potential. Photic stimulation did not elicit any abnormalities.  There were no epileptiform discharges or electrographic seizures seen.    EKG lead was unremarkable.  Impression: This awake and drowsy EEG is within normal limits for age.  Clinical Correlation: A normal EEG does not exclude a clinical diagnosis of epilepsy.  If further clinical questions  remain, prolonged EEG may be helpful.  Clinical correlation is advised.   Ellouise Newer, M.D.

## 2013-11-24 NOTE — Progress Notes (Signed)
Patient came in for EEG. See Procedure notes for EEG results.  

## 2013-11-25 ENCOUNTER — Telehealth: Payer: Self-pay | Admitting: Neurology

## 2013-11-25 NOTE — Telephone Encounter (Signed)
Has this been reviwed

## 2013-11-25 NOTE — Telephone Encounter (Signed)
Spoke to daughter, discussed normal EEG. She is already on a low dose of Keppra 500mg /day. Start reducing to 1/2 tab daily for 1 week, then 1/2 tab every other day for 1 week, then stop. Discussed risk of breakthrough seizures anytime any medication changes are made, however in her case risk is low and would monitor patient during the taper and up to a month after. She does not drive. Daughter knows to call our office for any problems.

## 2013-11-25 NOTE — Telephone Encounter (Signed)
Pt's daughter called wanting to f/e on the results for her mother's EEG.

## 2013-12-01 ENCOUNTER — Ambulatory Visit (HOSPITAL_COMMUNITY): Payer: MEDICARE

## 2013-12-05 ENCOUNTER — Ambulatory Visit (HOSPITAL_COMMUNITY): Payer: MEDICARE

## 2013-12-07 ENCOUNTER — Ambulatory Visit (HOSPITAL_COMMUNITY): Payer: MEDICARE

## 2013-12-09 ENCOUNTER — Ambulatory Visit (HOSPITAL_COMMUNITY): Payer: MEDICARE

## 2013-12-12 ENCOUNTER — Ambulatory Visit (HOSPITAL_COMMUNITY): Payer: MEDICARE

## 2013-12-13 ENCOUNTER — Other Ambulatory Visit: Payer: Self-pay | Admitting: *Deleted

## 2013-12-13 NOTE — Telephone Encounter (Signed)
Left msg on triage requesting refill on pt tramadol. Per dr Linna Darner ok to refill x's 1 printing script...Johny Chess

## 2013-12-14 ENCOUNTER — Ambulatory Visit (HOSPITAL_COMMUNITY): Payer: MEDICARE

## 2013-12-14 MED ORDER — TRAMADOL HCL 50 MG PO TABS: 50.0000 mg | ORAL_TABLET | Freq: Four times a day (QID) | ORAL | Status: DC | PRN

## 2013-12-14 NOTE — Telephone Encounter (Signed)
Notified daughter faxing 30 day supply to [facility]...Johny Chess

## 2013-12-14 NOTE — Telephone Encounter (Signed)
Called daughter [name]) back to verify who rx med not on med list. She stated was originally rx by the orthopedic md for her knees. Inform [name] will send 30 to [facility]...Betty Chan

## 2013-12-16 ENCOUNTER — Ambulatory Visit (HOSPITAL_COMMUNITY): Payer: MEDICARE

## 2013-12-19 ENCOUNTER — Ambulatory Visit (HOSPITAL_COMMUNITY): Payer: MEDICARE

## 2013-12-21 ENCOUNTER — Ambulatory Visit (HOSPITAL_COMMUNITY): Payer: MEDICARE

## 2013-12-23 ENCOUNTER — Ambulatory Visit (HOSPITAL_COMMUNITY): Payer: MEDICARE

## 2013-12-26 ENCOUNTER — Ambulatory Visit (HOSPITAL_COMMUNITY): Payer: MEDICARE

## 2013-12-28 ENCOUNTER — Ambulatory Visit (HOSPITAL_COMMUNITY): Payer: MEDICARE

## 2013-12-30 ENCOUNTER — Ambulatory Visit (HOSPITAL_COMMUNITY): Payer: MEDICARE

## 2014-01-02 ENCOUNTER — Ambulatory Visit (HOSPITAL_COMMUNITY): Payer: MEDICARE

## 2014-01-04 ENCOUNTER — Ambulatory Visit (HOSPITAL_COMMUNITY): Payer: MEDICARE

## 2014-01-05 ENCOUNTER — Telehealth: Payer: Self-pay | Admitting: Internal Medicine

## 2014-01-05 NOTE — Telephone Encounter (Signed)
Aaron Edelman with [facility] states that he is at the patient's home and she is having an issue projecting her voice and he wants to inquire about a referral for for speech therapy. Verbal is okay if Dr. Hermina Staggers agrees. Please advise.

## 2014-01-05 NOTE — Telephone Encounter (Signed)
done

## 2014-01-05 NOTE — Telephone Encounter (Signed)
Verbal ok?

## 2014-01-06 ENCOUNTER — Ambulatory Visit (HOSPITAL_COMMUNITY): Payer: MEDICARE

## 2014-01-09 ENCOUNTER — Ambulatory Visit (HOSPITAL_COMMUNITY): Payer: MEDICARE

## 2014-01-11 ENCOUNTER — Ambulatory Visit (HOSPITAL_COMMUNITY): Payer: MEDICARE

## 2014-01-13 ENCOUNTER — Telehealth: Payer: Self-pay | Admitting: Internal Medicine

## 2014-01-13 ENCOUNTER — Ambulatory Visit (HOSPITAL_COMMUNITY): Payer: MEDICARE

## 2014-01-13 DIAGNOSIS — Z1239 Encounter for other screening for malignant neoplasm of breast: Secondary | ICD-10-CM

## 2014-01-13 DIAGNOSIS — M8580 Other specified disorders of bone density and structure, unspecified site: Secondary | ICD-10-CM

## 2014-01-13 DIAGNOSIS — Z853 Personal history of malignant neoplasm of breast: Secondary | ICD-10-CM

## 2014-01-13 NOTE — Telephone Encounter (Signed)
Pt's daughter call stated that Betty Chan need is due for her yearly chest x-ray,bone density and mammogram, pt was diagnose with breast cancer 10 years ago and the doctor up in [place] recommend her to get this done yearly. Pt's daughter was wondering if Dr. Asa Lente to put in referral for this and the oncology. Please advise.

## 2014-01-16 ENCOUNTER — Ambulatory Visit (HOSPITAL_COMMUNITY): Payer: MEDICARE

## 2014-01-16 ENCOUNTER — Other Ambulatory Visit: Payer: Self-pay | Admitting: *Deleted

## 2014-01-16 DIAGNOSIS — I10 Essential (primary) hypertension: Secondary | ICD-10-CM | POA: Insufficient documentation

## 2014-01-16 MED ORDER — TRAMADOL HCL 50 MG PO TABS: 50.0000 mg | ORAL_TABLET | Freq: Four times a day (QID) | ORAL | Status: DC | PRN

## 2014-01-16 MED ORDER — LISINOPRIL 5 MG PO TABS: 5.0000 mg | ORAL_TABLET | Freq: Every day | ORAL | Status: DC

## 2014-01-16 MED ORDER — ATORVASTATIN CALCIUM 20 MG PO TABS: 20.0000 mg | ORAL_TABLET | Freq: Every day | ORAL | Status: DC

## 2014-01-16 NOTE — Telephone Encounter (Signed)
Tramadol - rx done

## 2014-01-16 NOTE — Telephone Encounter (Signed)
If breast cancer was >10 years ago and no active cancer at this time, there no reason to continue following with onc at this time orders placed for mammo + DEXA (at [facility]) and CXR ([facility]) as requsted

## 2014-01-16 NOTE — Telephone Encounter (Signed)
Pt's daughter call to check up on tramadol, they are hopping to get this refill today. Please advise.

## 2014-01-16 NOTE — Telephone Encounter (Signed)
Notified grand-daughter rx's has been sent bck to [facility]...Betty Chan

## 2014-01-16 NOTE — Telephone Encounter (Signed)
Grand-daughter left mdg on triage stating pt is needing refills on her tramadol only have 2 pills left, also lisinopril, and atorvastatin. Pls send to [facility]/[place].../lmb   Sent lisinopril & atorvastatin pls advise on tramadol...Betty Chan

## 2014-01-17 NOTE — Telephone Encounter (Signed)
Left vm for pt's daughter to call back, going to inform pt that pt can come and do x-ray at [facility] but the mammo and dexa, [facility] will be calling her for the appt.

## 2014-01-18 ENCOUNTER — Ambulatory Visit (HOSPITAL_COMMUNITY): Payer: MEDICARE

## 2014-01-20 ENCOUNTER — Ambulatory Visit (HOSPITAL_COMMUNITY): Payer: MEDICARE

## 2014-01-23 ENCOUNTER — Ambulatory Visit (HOSPITAL_COMMUNITY): Payer: MEDICARE

## 2014-01-25 ENCOUNTER — Ambulatory Visit (HOSPITAL_COMMUNITY): Payer: MEDICARE

## 2014-01-27 ENCOUNTER — Ambulatory Visit (HOSPITAL_COMMUNITY): Payer: MEDICARE

## 2014-01-30 ENCOUNTER — Ambulatory Visit (HOSPITAL_COMMUNITY): Payer: MEDICARE

## 2014-02-01 ENCOUNTER — Ambulatory Visit (HOSPITAL_COMMUNITY): Payer: MEDICARE

## 2014-02-03 ENCOUNTER — Ambulatory Visit (HOSPITAL_COMMUNITY): Payer: MEDICARE

## 2014-02-06 ENCOUNTER — Ambulatory Visit (HOSPITAL_COMMUNITY): Payer: MEDICARE

## 2014-02-08 ENCOUNTER — Ambulatory Visit (HOSPITAL_COMMUNITY): Payer: MEDICARE

## 2014-02-10 ENCOUNTER — Ambulatory Visit
Admission: RE | Admit: 2014-02-10 | Discharge: 2014-02-10 | Disposition: A | Discharge status: Home / Self Care | Payer: BC Managed Care – PPO | Source: Ambulatory Visit | Attending: Internal Medicine | Admitting: Internal Medicine

## 2014-02-10 ENCOUNTER — Ambulatory Visit (HOSPITAL_COMMUNITY): Payer: MEDICARE

## 2014-02-10 DIAGNOSIS — Z853 Personal history of malignant neoplasm of breast: Secondary | ICD-10-CM

## 2014-02-10 DIAGNOSIS — M8580 Other specified disorders of bone density and structure, unspecified site: Secondary | ICD-10-CM

## 2014-02-10 DIAGNOSIS — Z1239 Encounter for other screening for malignant neoplasm of breast: Secondary | ICD-10-CM

## 2014-02-10 LAB — HM DEXA SCAN

## 2014-02-13 ENCOUNTER — Encounter: Payer: Self-pay | Admitting: Internal Medicine

## 2014-02-13 ENCOUNTER — Telehealth: Payer: Self-pay

## 2014-02-13 ENCOUNTER — Ambulatory Visit (HOSPITAL_COMMUNITY): Payer: MEDICARE

## 2014-02-13 DIAGNOSIS — M8580 Other specified disorders of bone density and structure, unspecified site: Secondary | ICD-10-CM

## 2014-02-13 HISTORY — DX: Other specified disorders of bone density and structure, unspecified site: M85.80

## 2014-02-13 NOTE — Telephone Encounter (Signed)
Daughter stated that the pt was taken off of the calcium when heart valve was replaced. The MD was Dr. [name] at [facility].

## 2014-02-13 NOTE — Telephone Encounter (Signed)
Ok, thanks for the note Continue Vit D as ongoing, no calcium thanks

## 2014-02-15 ENCOUNTER — Ambulatory Visit (HOSPITAL_COMMUNITY): Payer: MEDICARE

## 2014-02-17 ENCOUNTER — Ambulatory Visit (HOSPITAL_COMMUNITY): Payer: MEDICARE

## 2014-02-20 ENCOUNTER — Ambulatory Visit (HOSPITAL_COMMUNITY): Payer: MEDICARE

## 2014-02-21 ENCOUNTER — Other Ambulatory Visit: Payer: Self-pay | Admitting: Internal Medicine

## 2014-02-22 ENCOUNTER — Ambulatory Visit (HOSPITAL_COMMUNITY): Payer: MEDICARE

## 2014-02-22 ENCOUNTER — Encounter: Payer: Self-pay | Admitting: Internal Medicine

## 2014-02-22 NOTE — Telephone Encounter (Signed)
#  30 until Dr Francella Solian back

## 2014-02-23 NOTE — Telephone Encounter (Signed)
She can have # 30 or wait  until Dr Francella Solian back

## 2014-02-24 ENCOUNTER — Ambulatory Visit (HOSPITAL_COMMUNITY): Payer: MEDICARE

## 2014-02-27 ENCOUNTER — Ambulatory Visit (HOSPITAL_COMMUNITY): Payer: MEDICARE

## 2014-03-01 ENCOUNTER — Ambulatory Visit (HOSPITAL_COMMUNITY): Payer: MEDICARE

## 2014-03-03 ENCOUNTER — Ambulatory Visit (HOSPITAL_COMMUNITY): Payer: MEDICARE

## 2014-03-06 ENCOUNTER — Ambulatory Visit (HOSPITAL_COMMUNITY): Payer: MEDICARE

## 2014-03-08 ENCOUNTER — Ambulatory Visit (HOSPITAL_COMMUNITY): Payer: MEDICARE

## 2014-03-09 ENCOUNTER — Encounter (HOSPITAL_COMMUNITY)
Admission: RE | Admit: 2014-03-09 | Discharge: 2014-03-09 | Disposition: A | Discharge status: Home / Self Care | Payer: MEDICARE | Source: Ambulatory Visit | Attending: Cardiology | Admitting: Cardiology

## 2014-03-09 DIAGNOSIS — Z48298 Encounter for aftercare following other organ transplant: Secondary | ICD-10-CM | POA: Insufficient documentation

## 2014-03-09 DIAGNOSIS — Z952 Presence of prosthetic heart valve: Secondary | ICD-10-CM | POA: Insufficient documentation

## 2014-03-09 NOTE — Progress Notes (Signed)
Cardiac Rehab Medication Review by a Pharmacist  Does the patient  feel that his/her medications are working for him/her?  yes  Has the patient been experiencing any side effects to the medications prescribed?  yes  Does the patient measure his/her own blood pressure or blood glucose at home?  no   Does the patient have any problems obtaining medications due to transportation or finances?   no  Understanding of regimen: excellent Understanding of indications: excellent Potential of compliance: excellent    Pharmacist comments: Patient's daughter manages her medications.  They both appear to have a good understanding of the indications and directions of the medications.  She complained of experiencing more fatigue recently.  I suggested that they follow-up with the physician outpatient.  May want to consider checking a TSH.    Theron Arista, PharmD Clinical Pharmacist - Resident Pager: [phone] 9/17/20158:39 AM

## 2014-03-10 ENCOUNTER — Ambulatory Visit (HOSPITAL_COMMUNITY): Payer: MEDICARE

## 2014-03-13 ENCOUNTER — Encounter (HOSPITAL_COMMUNITY)
Admission: RE | Admit: 2014-03-13 | Discharge: 2014-03-13 | Disposition: A | Discharge status: Home / Self Care | Payer: MEDICARE | Source: Ambulatory Visit | Attending: Cardiology | Admitting: Cardiology

## 2014-03-13 DIAGNOSIS — Z48298 Encounter for aftercare following other organ transplant: Secondary | ICD-10-CM | POA: Diagnosis present

## 2014-03-13 DIAGNOSIS — Z952 Presence of prosthetic heart valve: Secondary | ICD-10-CM | POA: Diagnosis not present

## 2014-03-13 NOTE — Progress Notes (Signed)
Pt started cardiac rehab today.  Pt tolerated light exercise without difficulty. Telemetry rhythm Sinus. Vital signs stable. Christianna uses a rolling walker for stability.. Pt complained about Right knee pain on the nustep and was switched to the arm ergometer. Will continue to monitor the patient throughout  the program.

## 2014-03-15 ENCOUNTER — Encounter (HOSPITAL_COMMUNITY)
Admission: RE | Admit: 2014-03-15 | Discharge: 2014-03-15 | Disposition: A | Discharge status: Home / Self Care | Payer: MEDICARE | Source: Ambulatory Visit | Attending: Cardiology | Admitting: Cardiology

## 2014-03-15 DIAGNOSIS — Z48298 Encounter for aftercare following other organ transplant: Secondary | ICD-10-CM | POA: Diagnosis not present

## 2014-03-17 ENCOUNTER — Encounter (HOSPITAL_COMMUNITY): Payer: MEDICARE

## 2014-03-20 ENCOUNTER — Encounter (HOSPITAL_COMMUNITY)
Admission: RE | Admit: 2014-03-20 | Discharge: 2014-03-20 | Disposition: A | Discharge status: Home / Self Care | Payer: MEDICARE | Source: Ambulatory Visit | Attending: Cardiology | Admitting: Cardiology

## 2014-03-20 DIAGNOSIS — Z48298 Encounter for aftercare following other organ transplant: Secondary | ICD-10-CM | POA: Diagnosis not present

## 2014-03-22 ENCOUNTER — Encounter (HOSPITAL_COMMUNITY)
Admission: RE | Admit: 2014-03-22 | Discharge: 2014-03-22 | Disposition: A | Discharge status: Home / Self Care | Payer: MEDICARE | Source: Ambulatory Visit | Attending: Cardiology | Admitting: Cardiology

## 2014-03-22 DIAGNOSIS — Z48298 Encounter for aftercare following other organ transplant: Secondary | ICD-10-CM | POA: Diagnosis not present

## 2014-03-22 NOTE — Progress Notes (Signed)
Reviewed home exercise with pt today.  Pt plans to walk and participate in exercise classes at fitness center in [place] for exercise.  Reviewed THR, pulse, RPE, sign and symptoms, and when to call 911 or MD.  Pt voiced understanding.  Also reviewed QOL with pt and will rescore. Alberteen Sam, MA, ACSM RCEP

## 2014-03-24 ENCOUNTER — Encounter (HOSPITAL_COMMUNITY): Payer: MEDICARE

## 2014-03-27 ENCOUNTER — Encounter (HOSPITAL_COMMUNITY)
Admission: RE | Admit: 2014-03-27 | Discharge: 2014-03-27 | Disposition: A | Discharge status: Home / Self Care | Payer: MEDICARE | Source: Ambulatory Visit | Attending: Cardiology | Admitting: Cardiology

## 2014-03-27 DIAGNOSIS — Z954 Presence of other heart-valve replacement: Secondary | ICD-10-CM | POA: Diagnosis present

## 2014-03-28 ENCOUNTER — Other Ambulatory Visit: Payer: Self-pay

## 2014-03-28 MED ORDER — OMEPRAZOLE 20 MG PO CPDR: 20.0000 mg | DELAYED_RELEASE_CAPSULE | Freq: Every day | ORAL | Status: DC

## 2014-03-28 MED ORDER — LEVOTHYROXINE SODIUM 50 MCG PO TABS: 50.0000 ug | ORAL_TABLET | Freq: Every day | ORAL | Status: DC

## 2014-03-28 NOTE — Progress Notes (Signed)
Quality of life questionnaire forwarded to Dr Basilio Cairo for review. Nalany denies feeling depressed and is enjoying coming to exercise. Janmarie's quality of life scores were low in the health and functioning area.

## 2014-03-29 ENCOUNTER — Encounter (HOSPITAL_COMMUNITY)
Admission: RE | Admit: 2014-03-29 | Discharge: 2014-03-29 | Disposition: A | Discharge status: Home / Self Care | Payer: MEDICARE | Source: Ambulatory Visit | Attending: Cardiology | Admitting: Cardiology

## 2014-03-29 ENCOUNTER — Other Ambulatory Visit: Payer: Self-pay

## 2014-03-29 DIAGNOSIS — Z954 Presence of other heart-valve replacement: Secondary | ICD-10-CM | POA: Diagnosis not present

## 2014-03-31 ENCOUNTER — Encounter (HOSPITAL_COMMUNITY): Payer: MEDICARE

## 2014-03-31 ENCOUNTER — Ambulatory Visit: Payer: BC Managed Care – PPO | Admitting: Internal Medicine

## 2014-04-03 ENCOUNTER — Encounter (HOSPITAL_COMMUNITY): Payer: MEDICARE

## 2014-04-03 ENCOUNTER — Telehealth: Payer: Self-pay | Admitting: Internal Medicine

## 2014-04-03 NOTE — Telephone Encounter (Signed)
Patient Daughter ask for a referral for Physical Therapy for lack of mobility, for patient (mom).  Please advise

## 2014-04-04 ENCOUNTER — Other Ambulatory Visit: Payer: Self-pay | Admitting: Internal Medicine

## 2014-04-04 ENCOUNTER — Telehealth (HOSPITAL_COMMUNITY): Payer: Self-pay | Admitting: *Deleted

## 2014-04-04 DIAGNOSIS — R627 Adult failure to thrive: Secondary | ICD-10-CM

## 2014-04-04 DIAGNOSIS — Z7409 Other reduced mobility: Secondary | ICD-10-CM

## 2014-04-05 ENCOUNTER — Encounter (HOSPITAL_COMMUNITY)
Admission: RE | Admit: 2014-04-05 | Discharge: 2014-04-05 | Disposition: A | Discharge status: Home / Self Care | Payer: MEDICARE | Source: Ambulatory Visit | Attending: Cardiology | Admitting: Cardiology

## 2014-04-05 DIAGNOSIS — Z954 Presence of other heart-valve replacement: Secondary | ICD-10-CM | POA: Diagnosis not present

## 2014-04-05 NOTE — Telephone Encounter (Signed)
Addressed under different phone note thanks

## 2014-04-05 NOTE — Progress Notes (Signed)
Family request PT given poor mobility - Order for same done

## 2014-04-07 ENCOUNTER — Encounter (HOSPITAL_COMMUNITY): Payer: MEDICARE

## 2014-04-10 ENCOUNTER — Encounter (HOSPITAL_COMMUNITY)
Admission: RE | Admit: 2014-04-10 | Discharge: 2014-04-10 | Disposition: A | Discharge status: Home / Self Care | Payer: MEDICARE | Source: Ambulatory Visit | Attending: Cardiology | Admitting: Cardiology

## 2014-04-10 DIAGNOSIS — Z954 Presence of other heart-valve replacement: Secondary | ICD-10-CM | POA: Diagnosis not present

## 2014-04-10 NOTE — Progress Notes (Signed)
Marlon Pel 80 y.o. female Nutrition Note Spoke with pt.  Nutrition Survey reviewed with pt. Pt is not currently following the Therapeutic Lifestyle Changes diet. Pt lives in an independent living facility. Age-appropriate diet recommendations discussed. Pt states she wants to lose wt "but I've gained wt." Wt loss tips reviewed. Pt expressed understanding of the information reviewed. Pt aware of nutrition education classes offered and is unable to attend nutrition classes.  Nutrition Diagnosis   Food-and nutrition-related knowledge deficit related to lack of exposure to information as related to diagnosis of: ? CVD    Overweight related to excessive energy intake as evidenced by a BMI of 29.2  Nutrition Intervention   Benefits of adopting Therapeutic Lifestyle Changes discussed when Medficts reviewed.   Pt to attend the Portion Distortion class - met; 03/22/14   Pt given handouts for: ? Nutrition I class ? Nutrition II class   Continue client-centered nutrition education by RD, as part of interdisciplinary care.  Goal(s)   Pt to identify food quantities necessary to achieve: ? wt loss to a wt loss goal of 10-15 lb (4.5-6.8 kg) at graduation from cardiac rehab.    Pt to describe the benefit of including fruits, vegetables, whole grains, and low-fat dairy products in a heart healthy meal plan.  Monitor and Evaluate progress toward nutrition goal with team.   Derek Mound, M.Ed, RD, LDN, CDE 04/10/2014 10:27 AM

## 2014-04-12 ENCOUNTER — Encounter (HOSPITAL_COMMUNITY)
Admission: RE | Admit: 2014-04-12 | Discharge: 2014-04-12 | Disposition: A | Discharge status: Home / Self Care | Payer: MEDICARE | Source: Ambulatory Visit | Attending: Cardiology | Admitting: Cardiology

## 2014-04-12 DIAGNOSIS — Z954 Presence of other heart-valve replacement: Secondary | ICD-10-CM | POA: Diagnosis not present

## 2014-04-14 ENCOUNTER — Encounter (HOSPITAL_COMMUNITY): Admission: RE | Admit: 2014-04-14 | Payer: MEDICARE | Source: Ambulatory Visit

## 2014-04-17 ENCOUNTER — Encounter (HOSPITAL_COMMUNITY): Payer: MEDICARE

## 2014-04-17 ENCOUNTER — Telehealth (HOSPITAL_COMMUNITY): Payer: Self-pay | Admitting: Internal Medicine

## 2014-04-18 ENCOUNTER — Ambulatory Visit: Payer: MEDICARE | Attending: Internal Medicine

## 2014-04-18 DIAGNOSIS — M6281 Muscle weakness (generalized): Secondary | ICD-10-CM | POA: Diagnosis not present

## 2014-04-18 DIAGNOSIS — Z95828 Presence of other vascular implants and grafts: Secondary | ICD-10-CM | POA: Insufficient documentation

## 2014-04-18 DIAGNOSIS — M8580 Other specified disorders of bone density and structure, unspecified site: Secondary | ICD-10-CM | POA: Diagnosis not present

## 2014-04-18 DIAGNOSIS — R5381 Other malaise: Secondary | ICD-10-CM | POA: Insufficient documentation

## 2014-04-18 DIAGNOSIS — I10 Essential (primary) hypertension: Secondary | ICD-10-CM | POA: Insufficient documentation

## 2014-04-18 DIAGNOSIS — R627 Adult failure to thrive: Secondary | ICD-10-CM | POA: Insufficient documentation

## 2014-04-18 DIAGNOSIS — Z9889 Other specified postprocedural states: Secondary | ICD-10-CM | POA: Insufficient documentation

## 2014-04-18 DIAGNOSIS — Z853 Personal history of malignant neoplasm of breast: Secondary | ICD-10-CM | POA: Insufficient documentation

## 2014-04-18 DIAGNOSIS — Z5189 Encounter for other specified aftercare: Secondary | ICD-10-CM | POA: Diagnosis present

## 2014-04-18 DIAGNOSIS — Z7409 Other reduced mobility: Secondary | ICD-10-CM | POA: Insufficient documentation

## 2014-04-19 ENCOUNTER — Encounter (HOSPITAL_COMMUNITY): Payer: MEDICARE

## 2014-04-20 ENCOUNTER — Ambulatory Visit: Payer: MEDICARE

## 2014-04-20 DIAGNOSIS — Z5189 Encounter for other specified aftercare: Secondary | ICD-10-CM | POA: Diagnosis not present

## 2014-04-21 ENCOUNTER — Ambulatory Visit: Payer: MEDICARE | Admitting: Physical Therapy

## 2014-04-21 ENCOUNTER — Encounter (HOSPITAL_COMMUNITY): Payer: MEDICARE

## 2014-04-24 ENCOUNTER — Ambulatory Visit: Payer: MEDICARE | Attending: Internal Medicine | Admitting: Physical Therapy

## 2014-04-24 ENCOUNTER — Encounter (HOSPITAL_COMMUNITY): Payer: MEDICARE

## 2014-04-24 DIAGNOSIS — Z7409 Other reduced mobility: Secondary | ICD-10-CM | POA: Insufficient documentation

## 2014-04-24 DIAGNOSIS — Z853 Personal history of malignant neoplasm of breast: Secondary | ICD-10-CM | POA: Insufficient documentation

## 2014-04-24 DIAGNOSIS — R627 Adult failure to thrive: Secondary | ICD-10-CM | POA: Insufficient documentation

## 2014-04-24 DIAGNOSIS — Z95828 Presence of other vascular implants and grafts: Secondary | ICD-10-CM | POA: Diagnosis not present

## 2014-04-24 DIAGNOSIS — R5381 Other malaise: Secondary | ICD-10-CM | POA: Insufficient documentation

## 2014-04-24 DIAGNOSIS — I10 Essential (primary) hypertension: Secondary | ICD-10-CM | POA: Diagnosis not present

## 2014-04-24 DIAGNOSIS — M6281 Muscle weakness (generalized): Secondary | ICD-10-CM | POA: Insufficient documentation

## 2014-04-24 DIAGNOSIS — M8580 Other specified disorders of bone density and structure, unspecified site: Secondary | ICD-10-CM | POA: Insufficient documentation

## 2014-04-24 DIAGNOSIS — Z5189 Encounter for other specified aftercare: Secondary | ICD-10-CM | POA: Insufficient documentation

## 2014-04-24 DIAGNOSIS — Z9889 Other specified postprocedural states: Secondary | ICD-10-CM | POA: Insufficient documentation

## 2014-04-25 ENCOUNTER — Ambulatory Visit: Payer: MEDICARE

## 2014-04-25 ENCOUNTER — Telehealth (HOSPITAL_COMMUNITY): Payer: Self-pay | Admitting: *Deleted

## 2014-04-25 DIAGNOSIS — Z5189 Encounter for other specified aftercare: Secondary | ICD-10-CM | POA: Diagnosis not present

## 2014-04-26 ENCOUNTER — Encounter (HOSPITAL_COMMUNITY): Payer: MEDICARE

## 2014-04-27 ENCOUNTER — Ambulatory Visit: Payer: MEDICARE

## 2014-04-27 DIAGNOSIS — Z5189 Encounter for other specified aftercare: Secondary | ICD-10-CM | POA: Diagnosis not present

## 2014-04-28 ENCOUNTER — Encounter (HOSPITAL_COMMUNITY): Payer: MEDICARE

## 2014-05-01 ENCOUNTER — Encounter (HOSPITAL_COMMUNITY): Payer: MEDICARE

## 2014-05-01 ENCOUNTER — Ambulatory Visit: Payer: MEDICARE

## 2014-05-01 DIAGNOSIS — Z5189 Encounter for other specified aftercare: Secondary | ICD-10-CM | POA: Diagnosis not present

## 2014-05-02 ENCOUNTER — Ambulatory Visit: Payer: MEDICARE | Admitting: Physical Therapy

## 2014-05-02 DIAGNOSIS — Z5189 Encounter for other specified aftercare: Secondary | ICD-10-CM | POA: Diagnosis not present

## 2014-05-03 ENCOUNTER — Encounter (HOSPITAL_COMMUNITY): Payer: MEDICARE

## 2014-05-04 ENCOUNTER — Ambulatory Visit: Payer: MEDICARE | Admitting: Physical Therapy

## 2014-05-04 DIAGNOSIS — Z5189 Encounter for other specified aftercare: Secondary | ICD-10-CM | POA: Diagnosis not present

## 2014-05-05 ENCOUNTER — Encounter (HOSPITAL_COMMUNITY): Payer: MEDICARE

## 2014-05-08 ENCOUNTER — Encounter (HOSPITAL_COMMUNITY): Payer: MEDICARE

## 2014-05-08 ENCOUNTER — Encounter: Payer: BC Managed Care – PPO | Admitting: Physical Therapy

## 2014-05-09 ENCOUNTER — Ambulatory Visit: Payer: MEDICARE | Admitting: Physical Therapy

## 2014-05-10 ENCOUNTER — Encounter (HOSPITAL_COMMUNITY): Payer: MEDICARE

## 2014-05-10 DIAGNOSIS — Z954 Presence of other heart-valve replacement: Secondary | ICD-10-CM | POA: Diagnosis not present

## 2014-05-11 ENCOUNTER — Ambulatory Visit: Payer: MEDICARE | Admitting: Physical Therapy

## 2014-05-11 DIAGNOSIS — Z5189 Encounter for other specified aftercare: Secondary | ICD-10-CM | POA: Diagnosis not present

## 2014-05-12 ENCOUNTER — Encounter (HOSPITAL_COMMUNITY): Payer: MEDICARE

## 2014-05-15 ENCOUNTER — Encounter (HOSPITAL_COMMUNITY): Payer: MEDICARE

## 2014-05-16 ENCOUNTER — Ambulatory Visit: Payer: MEDICARE | Admitting: Physical Therapy

## 2014-05-17 ENCOUNTER — Encounter (HOSPITAL_COMMUNITY): Payer: MEDICARE

## 2014-05-19 ENCOUNTER — Encounter (HOSPITAL_COMMUNITY): Payer: MEDICARE

## 2014-05-22 ENCOUNTER — Encounter (HOSPITAL_COMMUNITY): Payer: MEDICARE

## 2014-05-23 ENCOUNTER — Ambulatory Visit: Payer: MEDICARE | Attending: Internal Medicine | Admitting: Physical Therapy

## 2014-05-23 DIAGNOSIS — M8580 Other specified disorders of bone density and structure, unspecified site: Secondary | ICD-10-CM | POA: Diagnosis not present

## 2014-05-23 DIAGNOSIS — R5381 Other malaise: Secondary | ICD-10-CM | POA: Insufficient documentation

## 2014-05-23 DIAGNOSIS — Z7409 Other reduced mobility: Secondary | ICD-10-CM | POA: Diagnosis not present

## 2014-05-23 DIAGNOSIS — R627 Adult failure to thrive: Secondary | ICD-10-CM | POA: Insufficient documentation

## 2014-05-23 DIAGNOSIS — Z853 Personal history of malignant neoplasm of breast: Secondary | ICD-10-CM | POA: Diagnosis not present

## 2014-05-23 DIAGNOSIS — M6281 Muscle weakness (generalized): Secondary | ICD-10-CM | POA: Diagnosis not present

## 2014-05-23 DIAGNOSIS — Z5189 Encounter for other specified aftercare: Secondary | ICD-10-CM | POA: Insufficient documentation

## 2014-05-23 DIAGNOSIS — Z95828 Presence of other vascular implants and grafts: Secondary | ICD-10-CM | POA: Diagnosis not present

## 2014-05-23 DIAGNOSIS — I10 Essential (primary) hypertension: Secondary | ICD-10-CM | POA: Insufficient documentation

## 2014-05-23 DIAGNOSIS — Z9889 Other specified postprocedural states: Secondary | ICD-10-CM | POA: Diagnosis not present

## 2014-05-24 ENCOUNTER — Encounter (HOSPITAL_COMMUNITY): Payer: MEDICARE

## 2014-05-25 ENCOUNTER — Ambulatory Visit: Payer: MEDICARE | Admitting: Physical Therapy

## 2014-05-25 DIAGNOSIS — Z5189 Encounter for other specified aftercare: Secondary | ICD-10-CM | POA: Diagnosis not present

## 2014-05-26 ENCOUNTER — Encounter (HOSPITAL_COMMUNITY): Payer: MEDICARE

## 2014-05-29 ENCOUNTER — Other Ambulatory Visit: Payer: Self-pay | Admitting: Internal Medicine

## 2014-05-29 ENCOUNTER — Encounter (HOSPITAL_COMMUNITY): Payer: MEDICARE

## 2014-05-30 ENCOUNTER — Ambulatory Visit: Payer: MEDICARE | Admitting: Physical Therapy

## 2014-05-30 DIAGNOSIS — Z5189 Encounter for other specified aftercare: Secondary | ICD-10-CM | POA: Diagnosis not present

## 2014-05-31 ENCOUNTER — Encounter (HOSPITAL_COMMUNITY): Payer: MEDICARE

## 2014-06-01 ENCOUNTER — Ambulatory Visit: Payer: MEDICARE | Admitting: Physical Therapy

## 2014-06-01 DIAGNOSIS — Z5189 Encounter for other specified aftercare: Secondary | ICD-10-CM | POA: Diagnosis not present

## 2014-06-02 ENCOUNTER — Encounter (HOSPITAL_COMMUNITY): Payer: MEDICARE

## 2014-06-05 ENCOUNTER — Encounter (HOSPITAL_COMMUNITY): Payer: MEDICARE

## 2014-06-06 ENCOUNTER — Ambulatory Visit: Payer: MEDICARE | Admitting: Physical Therapy

## 2014-06-06 DIAGNOSIS — Z5189 Encounter for other specified aftercare: Secondary | ICD-10-CM | POA: Diagnosis not present

## 2014-06-07 ENCOUNTER — Ambulatory Visit: Payer: BC Managed Care – PPO | Admitting: Internal Medicine

## 2014-06-07 ENCOUNTER — Encounter (HOSPITAL_COMMUNITY): Payer: MEDICARE

## 2014-06-08 ENCOUNTER — Ambulatory Visit: Payer: MEDICARE

## 2014-06-08 DIAGNOSIS — Z5189 Encounter for other specified aftercare: Secondary | ICD-10-CM | POA: Diagnosis not present

## 2014-06-09 ENCOUNTER — Encounter (HOSPITAL_COMMUNITY): Payer: MEDICARE

## 2014-06-12 ENCOUNTER — Encounter (HOSPITAL_COMMUNITY): Payer: MEDICARE

## 2014-06-13 ENCOUNTER — Ambulatory Visit: Payer: MEDICARE

## 2014-06-13 DIAGNOSIS — Z5189 Encounter for other specified aftercare: Secondary | ICD-10-CM | POA: Diagnosis not present

## 2014-06-14 ENCOUNTER — Encounter (HOSPITAL_COMMUNITY): Payer: MEDICARE

## 2014-06-16 ENCOUNTER — Encounter (HOSPITAL_COMMUNITY): Payer: MEDICARE

## 2014-06-22 ENCOUNTER — Ambulatory Visit: Payer: MEDICARE | Admitting: Physical Therapy

## 2014-06-23 HISTORY — PX: SQUAMOUS CELL CARCINOMA EXCISION: SHX2433

## 2014-06-26 ENCOUNTER — Other Ambulatory Visit (INDEPENDENT_AMBULATORY_CARE_PROVIDER_SITE_OTHER): Payer: Medicare Other

## 2014-06-26 ENCOUNTER — Encounter: Payer: Self-pay | Admitting: Internal Medicine

## 2014-06-26 ENCOUNTER — Ambulatory Visit (INDEPENDENT_AMBULATORY_CARE_PROVIDER_SITE_OTHER): Payer: Medicare Other | Admitting: Internal Medicine

## 2014-06-26 VITALS — BP 140/80 | HR 65 | Temp 97.4°F | Resp 16 | Ht 66.0 in | Wt 183.0 lb

## 2014-06-26 DIAGNOSIS — Z418 Encounter for other procedures for purposes other than remedying health state: Secondary | ICD-10-CM

## 2014-06-26 DIAGNOSIS — E039 Hypothyroidism, unspecified: Secondary | ICD-10-CM

## 2014-06-26 DIAGNOSIS — R609 Edema, unspecified: Secondary | ICD-10-CM

## 2014-06-26 DIAGNOSIS — Z299 Encounter for prophylactic measures, unspecified: Secondary | ICD-10-CM

## 2014-06-26 DIAGNOSIS — E785 Hyperlipidemia, unspecified: Secondary | ICD-10-CM

## 2014-06-26 DIAGNOSIS — M170 Bilateral primary osteoarthritis of knee: Secondary | ICD-10-CM

## 2014-06-26 DIAGNOSIS — Z23 Encounter for immunization: Secondary | ICD-10-CM

## 2014-06-26 LAB — LIPID PANEL
Cholesterol: 149 mg/dL (ref 0–200)
HDL: 47.1 mg/dL (ref >39.00)
LDL CALC: 87 mg/dL (ref 0–99)
NonHDL: 101.9
Total CHOL/HDL Ratio: 3
Triglycerides: 77 mg/dL (ref 0.0–149.0)
VLDL: 15.4 mg/dL (ref 0.0–40.0)

## 2014-06-26 LAB — TSH: TSH: 1.33 u[IU]/mL (ref 0.35–4.50)

## 2014-06-26 MED ORDER — ATORVASTATIN CALCIUM 20 MG PO TABS: 20.0000 mg | ORAL_TABLET | Freq: Every day | ORAL | Status: DC

## 2014-06-26 MED ORDER — TRAMADOL HCL 50 MG PO TABS: 50.0000 mg | ORAL_TABLET | Freq: Four times a day (QID) | ORAL | Status: DC | PRN

## 2014-06-26 MED ORDER — METOPROLOL TARTRATE 25 MG PO TABS: 12.5000 mg | ORAL_TABLET | Freq: Two times a day (BID) | ORAL | Status: DC

## 2014-06-26 MED ORDER — TOLTERODINE TARTRATE ER 2 MG PO CP24: 2.0000 mg | ORAL_CAPSULE | Freq: Every day | ORAL | Status: DC

## 2014-06-26 MED ORDER — ACETAMINOPHEN 500 MG PO TABS: 500.0000 mg | ORAL_TABLET | Freq: Four times a day (QID) | ORAL | Status: DC | PRN

## 2014-06-26 NOTE — Progress Notes (Signed)
Pre visit review using our clinic review tool, if applicable. No additional management support is needed unless otherwise documented below in the visit note. 

## 2014-06-26 NOTE — Progress Notes (Signed)
Subjective:    Patient ID: Betty Chan, female    DOB: [birth year], 81 y.o.   MRN: [id number]  HPI  Patient is here for follow up -last OV 08/2013 Reviewed chronic medical issues and interval medical events  Past Medical History  Diagnosis Date  . Aortic stenosis     s/p TAVR 08/2013 -follows at [facility] cards [facility])  . TIA (transient ischemic attack)   . Breast cancer 2004    lumpectomy  . Seizure 2007    single event  . OA (osteoarthritis) of knee   . Hypothyroid   . Dyslipidemia   . PHN (postherpetic neuralgia)     L anterior thigh 2007, recurrent 04/2013  . GERD (gastroesophageal reflux disease)   . Osteopenia 02/13/2014    DEXA 01/2014: -2.2    Review of Systems  Constitutional: Negative for fatigue and unexpected weight change.  Respiratory: Negative for cough and shortness of breath.   Cardiovascular: Positive for leg swelling (chronic). Negative for palpitations.  Musculoskeletal: Positive for arthralgias (R knee).       Objective:   Physical Exam  BP 140/80 mmHg  Pulse 65  Temp(Src) 97.4 F (36.3 C) (Oral)  Resp 16  Ht 5\' 6"  (1.676 m)  Wt 183 lb (83.008 kg)  BMI 29.55 kg/m2  SpO2 99% Wt Readings from Last 3 Encounters:  06/26/14 183 lb (83.008 kg)  03/09/14 181 lb 3.5 oz (82.2 kg)  10/04/13 176 lb (79.833 kg)   Constitutional: She appears well-developed and well-nourished. No distress. dtr at side Neck: Normal range of motion. Neck supple. No JVD present. No thyromegaly present.  Cardiovascular: Normal rate, regular rhythm and normal heart sounds.  No murmur heard. trace-1+ chronic BLE edema. Pulmonary/Chest: Effort normal and breath sounds normal. No respiratory distress. She has no wheezes.  Psychiatric: She has a normal mood and affect. Her behavior is normal. Judgment and thought content normal.   Lab Results  Component Value Date   WBC 9.3 05/15/2013   HGB 12.2 05/15/2013   HCT 36.8 05/15/2013   PLT 306 05/15/2013   GLUCOSE 113* 05/15/2013    CHOL 226* 05/30/2013   TRIG 76.0 05/30/2013   HDL 57.20 05/30/2013   LDLDIRECT 155.6 05/30/2013   ALT 21 05/30/2013   AST 28 05/30/2013   NA 139 05/15/2013   K 4.0 05/15/2013   CL 103 05/15/2013   CREATININE 0.99 05/15/2013   BUN 25* 05/15/2013   CO2 25 05/15/2013   TSH 2.68 05/30/2013    No results found.     Assessment & Plan:   Problem List Items Addressed This Visit    Dyslipidemia    On simvastatin 40 mg daily until episode of traumatic rhabdomyolysis October 2014. Recheck lipid 05/2013 with LDL>155 -normalized CPK level. Statin resumed based on results given history of TIA and need for aggressive risk reduction   Lab Results  Component Value Date   CKTOTAL 197* 05/30/2013      Relevant Medications      atorvastatin (LIPITOR) tablet   Other Relevant Orders      Lipid panel   Hypothyroid    On replacement for years Check TSH, adjust dose as needed Lab Results  Component Value Date   TSH 2.68 05/30/2013      Relevant Medications      metoprolol tartrate (LOPRESSOR) tablet   Other Relevant Orders      TSH   OA (osteoarthritis) of knee - Primary    Ongoing tx by [facility] sports medicine  at [facility]. Previously on daily low-dose meloxicam for same, stopped 08/2013 when begun Plavix (s/p TAVR) Steroid injection since November 2014 q6-8 weeks relief -?repeat Synvisc series Interval history reviewed, add scheduled tylenol and use tramadol prn - no other changes recommended continue OP PT for gait training    Relevant Medications      acetaminophen (TYLENOL) tablet      traMADol (ULTRAM) 50 MG tablet   Other Relevant Orders      Ambulatory referral to [place]    Other Visit Diagnoses    Edema        Relevant Orders       Ambulatory referral to [place]

## 2014-06-26 NOTE — Assessment & Plan Note (Addendum)
On simvastatin 40 mg daily until episode of traumatic rhabdomyolysis October 2014. Recheck lipid 05/2013 with LDL>155 -normalized CPK level. Statin resumed based on results given history of TIA and need for aggressive risk reduction   Lab Results  Component Value Date   CKTOTAL 197* 05/30/2013

## 2014-06-26 NOTE — Patient Instructions (Addendum)
It was good to see you today.  We have reviewed your prior records including labs and tests today  Prevnar 13 pneumonia vaccination administered today  Test(s) ordered today. Your results will be released to [place] (or called to you) after review, usually within 72hours after test completion. If any changes need to be made, you will be notified at that same time.  Medications reviewed and updated Begin scheduled Tylenol Extra Strength 2 tablets 2 or 3 times daily in addition to tramadol as needed for arthritis pain No other changes recommended at this time. Refill on medication(s) as discussed today.  we'll make referral to home health for fitting of compression hose . Our office will contact you regarding appointment(s) once made.  Please schedule followup in 6-12 months, call sooner if problems. Ok to transfer to Dr Doug Sou if desired - let me know how i can help!

## 2014-06-26 NOTE — Assessment & Plan Note (Signed)
On replacement for years Check TSH, adjust dose as needed Lab Results  Component Value Date   TSH 2.68 05/30/2013

## 2014-06-26 NOTE — Assessment & Plan Note (Signed)
Ongoing tx by Alfonso Ramus sports medicine at [facility]. Previously on daily low-dose meloxicam for same, stopped 08/2013 when begun Plavix (s/p TAVR) Steroid injection since November 2014 q6-8 weeks relief -?repeat Synvisc series Interval history reviewed, add scheduled tylenol and use tramadol prn - no other changes recommended continue OP PT for gait training

## 2014-06-27 ENCOUNTER — Ambulatory Visit: Payer: Medicare Other | Attending: Internal Medicine | Admitting: Physical Therapy

## 2014-06-27 DIAGNOSIS — R627 Adult failure to thrive: Secondary | ICD-10-CM | POA: Diagnosis not present

## 2014-06-27 DIAGNOSIS — Z5189 Encounter for other specified aftercare: Secondary | ICD-10-CM | POA: Diagnosis not present

## 2014-06-27 DIAGNOSIS — M6281 Muscle weakness (generalized): Secondary | ICD-10-CM | POA: Insufficient documentation

## 2014-06-27 DIAGNOSIS — I10 Essential (primary) hypertension: Secondary | ICD-10-CM | POA: Insufficient documentation

## 2014-06-27 DIAGNOSIS — M8580 Other specified disorders of bone density and structure, unspecified site: Secondary | ICD-10-CM | POA: Insufficient documentation

## 2014-06-27 DIAGNOSIS — Z7409 Other reduced mobility: Secondary | ICD-10-CM | POA: Insufficient documentation

## 2014-06-27 DIAGNOSIS — Z853 Personal history of malignant neoplasm of breast: Secondary | ICD-10-CM | POA: Diagnosis not present

## 2014-06-27 DIAGNOSIS — R5381 Other malaise: Secondary | ICD-10-CM | POA: Diagnosis not present

## 2014-06-27 DIAGNOSIS — Z9889 Other specified postprocedural states: Secondary | ICD-10-CM | POA: Insufficient documentation

## 2014-06-27 DIAGNOSIS — Z95828 Presence of other vascular implants and grafts: Secondary | ICD-10-CM | POA: Insufficient documentation

## 2014-06-29 ENCOUNTER — Ambulatory Visit: Payer: Medicare Other | Admitting: Physical Therapy

## 2014-06-29 DIAGNOSIS — Z5189 Encounter for other specified aftercare: Secondary | ICD-10-CM | POA: Diagnosis not present

## 2014-07-04 ENCOUNTER — Ambulatory Visit: Payer: Medicare Other | Admitting: Physical Therapy

## 2014-07-04 DIAGNOSIS — Z5189 Encounter for other specified aftercare: Secondary | ICD-10-CM | POA: Diagnosis not present

## 2014-07-06 ENCOUNTER — Ambulatory Visit: Payer: Medicare Other | Admitting: Physical Therapy

## 2014-07-06 DIAGNOSIS — Z5189 Encounter for other specified aftercare: Secondary | ICD-10-CM | POA: Diagnosis not present

## 2014-07-11 ENCOUNTER — Ambulatory Visit: Payer: Medicare Other | Admitting: Physical Therapy

## 2014-07-11 DIAGNOSIS — Z5189 Encounter for other specified aftercare: Secondary | ICD-10-CM | POA: Diagnosis not present

## 2014-07-13 ENCOUNTER — Ambulatory Visit: Payer: Medicare Other | Admitting: Physical Therapy

## 2014-07-13 DIAGNOSIS — Z5189 Encounter for other specified aftercare: Secondary | ICD-10-CM | POA: Diagnosis not present

## 2014-07-17 ENCOUNTER — Ambulatory Visit (INDEPENDENT_AMBULATORY_CARE_PROVIDER_SITE_OTHER): Payer: Medicare Other | Admitting: Family Medicine

## 2014-07-17 ENCOUNTER — Encounter: Payer: Self-pay | Admitting: Family Medicine

## 2014-07-17 VITALS — BP 124/70 | HR 78 | Wt 184.0 lb

## 2014-07-17 DIAGNOSIS — M170 Bilateral primary osteoarthritis of knee: Secondary | ICD-10-CM

## 2014-07-17 NOTE — Progress Notes (Signed)
Pre visit review using our clinic review tool, if applicable. No additional management support is needed unless otherwise documented below in the visit note. 

## 2014-07-17 NOTE — Progress Notes (Signed)
  [name] [address] [place], [place] [address] Phone: [phone] Subjective:    I'm seeing this patient by the request  of:  [name], MD   CC: Right knee pain.   [id number] Betty Chan is a 81 y.o. female coming in with complaint of right knee pain. Patient has been having right knee pain for multiple years. Patient has been seen another provider and has been diagnosed with severe osteoarthritis. Patient was to avoid any type of surgical intervention. Patient has had steroid injections as well as viscous supplementation multiple months ago. Patient states it's may have been even greater than one year. Patient is having pain again that is causing it difficult to ambulate and even do daily activities. Patient states going up or downstairs is severely painful and patient avoids this. Patient states that Tylenol does seem to help with the pain. Patient was taking tramadol but unfortunately was making her dizzy and increasing the likelihood of her falling. Patient states that the pain is more of a dull throbbing aching sensation with sharp pain sometimes. Pain can wake her up at night. Patient is ambulating with the aid of a walker. Patient puts the severity of 7 out of 10.      Past medical history, social, surgical and family history all reviewed in electronic medical record.   Review of Systems: No headache, visual changes, nausea, vomiting, diarrhea, constipation, dizziness, abdominal pain, skin rash, fevers, chills, night sweats, weight loss, swollen lymph nodes, body aches, joint swelling, muscle aches, chest pain, shortness of breath, mood changes.   Objective Blood pressure 124/70, pulse 78, weight 184 lb (83.462 kg), SpO2 97 %.  General: No apparent distress alert and oriented x3 mood and affect normal, dressed appropriately.  HEENT: Pupils equal, extraocular movements intact  Respiratory: Patient's speak in full sentences and does  not appear short of breath  Cardiovascular: No lower extremity edema, non tender, no erythema  Skin: Warm dry intact with no signs of infection or rash on extremities or on axial skeleton.  Abdomen: Soft nontender  Neuro: Cranial nerves II through XII are intact, neurovascularly intact in all extremities with 2+ DTRs and 2+ pulses.  Lymph: No lymphadenopathy of posterior or anterior cervical chain or axillae bilaterally.  Gait ambulates with an aid of a walker MSK:  Non tender with full range of motion and good stability and symmetric strength and tone of shoulders, elbows, wrist, hip, and ankles bilaterally. Significant osteophytic changes of multiple joints Knee: Right Significant valgus deformity of the right knee Diffuse tenderness with most tenderness over the medial joint line Range of motion lacks last 5 of extension and flexion Ligaments with solid consistent endpoints including ACL, PCL, LCL, MCL.  painful patellar compression. Patellar glide with moderate crepitus. Patellar and quadriceps tendons unremarkable. Hamstring and quadriceps strength is normal.  Contralateral knee has degenerative changes but is nontender to palpation   After informed written and verbal consent, patient was seated on exam table. Right knee was prepped with alcohol swab and utilizing anterolateral approach, patient's right knee space was injected with 4:1  marcaine 0.5%: Kenalog 40mg /dL. Patient tolerated the procedure well without immediate complications.    Impression and Recommendations:     This case required medical decision making of moderate complexity.

## 2014-07-17 NOTE — Patient Instructions (Signed)
Good to meet you Ice 20 minutes 2 times daily. Usually after activity and before bed. Take tylenol 650 mg three times a day is the best evidence based medicine we have for arthritis.  Glucosamine sulfate 1500 mg twice a day is a supplement that has been shown to help moderate to severe arthritis. Vitamin D 2000 IU daily  Tumeric 500mg  daily.  Capsaicin topically up to four times a day may also help with pain. If cortisone injections do not help, there are different types of shots that may help but they take longer to take effect.  We can discuss this at follow up.  It's important that you continue to stay active. Controlling your weight is important.  Consider physical therapy to strengthen muscles around the joint that hurts to take pressure off of the joint itself. Shoe inserts with good arch support may be helpful.  [employer] orthotics online look for "total support", usually blue in color Water aerobics and cycling with low resistance are the best two types of exercise for arthritis. Come back and see me in 3 weeks.

## 2014-07-17 NOTE — Assessment & Plan Note (Signed)
Patient was given an injection today. We discussed home exercises and patient was given a medial unloader brace. We discussed topical anti-inflammatories.  Patient given some mild home exercises and range of motion. We discussed oral non-weightbearing exercises that could be beneficial. We discussed other over-the-counter medicines and may be helpful. Patient will come back again in 3 weeks for further evaluation. Depending on patient's response we may one also consider viscous supplementation. We'll discuss that at follow-up.

## 2014-07-18 ENCOUNTER — Ambulatory Visit: Payer: Medicare Other | Admitting: Physical Therapy

## 2014-07-18 DIAGNOSIS — Z5189 Encounter for other specified aftercare: Secondary | ICD-10-CM | POA: Diagnosis not present

## 2014-07-20 ENCOUNTER — Ambulatory Visit: Payer: Medicare Other

## 2014-07-20 DIAGNOSIS — Z5189 Encounter for other specified aftercare: Secondary | ICD-10-CM | POA: Diagnosis not present

## 2014-07-25 ENCOUNTER — Other Ambulatory Visit: Payer: Self-pay | Admitting: Internal Medicine

## 2014-07-27 ENCOUNTER — Other Ambulatory Visit: Payer: Self-pay | Admitting: Internal Medicine

## 2014-08-18 ENCOUNTER — Other Ambulatory Visit: Payer: Self-pay | Admitting: Internal Medicine

## 2014-09-06 ENCOUNTER — Ambulatory Visit: Payer: Medicare Other | Admitting: Internal Medicine

## 2014-09-08 ENCOUNTER — Ambulatory Visit: Payer: Medicare Other | Admitting: Internal Medicine

## 2014-09-11 ENCOUNTER — Telehealth: Payer: Self-pay | Admitting: Family Medicine

## 2014-09-11 NOTE — Telephone Encounter (Signed)
Patient daughter ask if Dr Tamala Julian could fit her mom in Monday for inj in her knee. Please advise

## 2014-09-12 NOTE — Telephone Encounter (Signed)
Spoke to pt's daughter. She was wanting her mom worked in this week for knee injections since they are leaving Friday to go out of town. Scheduled pt for 10:45am 3.24.16.

## 2014-09-14 ENCOUNTER — Encounter: Payer: Self-pay | Admitting: Family Medicine

## 2014-09-14 ENCOUNTER — Ambulatory Visit (INDEPENDENT_AMBULATORY_CARE_PROVIDER_SITE_OTHER): Payer: Medicare Other | Admitting: Family Medicine

## 2014-09-14 VITALS — BP 128/64 | HR 72 | Ht 66.0 in

## 2014-09-14 DIAGNOSIS — M170 Bilateral primary osteoarthritis of knee: Secondary | ICD-10-CM

## 2014-09-14 NOTE — Assessment & Plan Note (Signed)
Patient was given a repeat steroid injection today. We discussed the possibility of this particular no cartilage but patient does have severe bone-on-bone osteophytic changes. We discussed home exercises in the icing protocol. Patient will come back in 3 weeks. If patient continues to have pain she would be a candidate for viscous supplementation.

## 2014-09-14 NOTE — Progress Notes (Signed)
  [name] [address] [place], [place] [address] Phone: [phone] Subjective:     CC: Right knee painfollow-up.   [id number] Betty Chan is a 81 y.o. female coming in with complaint of right knee pain. Patient has been having right knee pain for multiple years. Patient is been diagnosed with severe osteophytic changes of the knee previously. Patient was seen 2 months ago and was given an injection, home exercises, bracing as well as an icing protocol. Patient was to get over-the-counter natural supplementations. Patient states she is doing better for approximate 6 weeks but started having increasing pain again. Patient had this injection 9 weeks ago.patient states that the pain is coming back.states that it is starting to affect her walking.      Past medical history, social, surgical and family history all reviewed in electronic medical record.   Review of Systems: No headache, visual changes, nausea, vomiting, diarrhea, constipation, dizziness, abdominal pain, skin rash, fevers, chills, night sweats, weight loss, swollen lymph nodes, body aches, joint swelling, muscle aches, chest pain, shortness of breath, mood changes.   Objective Blood pressure 128/64, pulse 72, height 5\' 6"  (1.676 m), SpO2 96 %.  General: No apparent distress alert and oriented x3 mood and affect normal, dressed appropriately.  HEENT: Pupils equal, extraocular movements intact  Respiratory: Patient's speak in full sentences and does not appear short of breath  Cardiovascular: No lower extremity edema, non tender, no erythema  Skin: Warm dry intact with no signs of infection or rash on extremities or on axial skeleton.  Abdomen: Soft nontender  Neuro: Cranial nerves II through XII are intact, neurovascularly intact in all extremities with 2+ DTRs and 2+ pulses.  Lymph: No lymphadenopathy of posterior or anterior cervical chain or axillae bilaterally.  Gait ambulates with  an aid of a walker MSK:  Non tender with full range of motion and good stability and symmetric strength and tone of shoulders, elbows, wrist, hip, and ankles bilaterally. Significant osteophytic changes of multiple joints Knee: Right Significant valgus deformity of the right knee Diffuse tenderness with most tenderness over the medial joint line Range of motion lacks last 5 of extension and flexion Ligaments with solid consistent endpoints including ACL, PCL, LCL, MCL.  painful patellar compression. Patellar glide with moderate crepitus. Patellar and quadriceps tendons unremarkable. Hamstring and quadriceps strength is normal.  Contralateral knee has degenerative changes but is nontender to palpation  procedure After informed written and verbal consent, patient was seated on exam table. Right knee was prepped with alcohol swab and utilizing anterolateral approach, patient's right knee space was injected with 4:1  marcaine 0.5%: Kenalog 40mg /dL. Patient tolerated the procedure well without immediate complications.    Impression and Recommendations:     This case required medical decision making of moderate complexity.

## 2014-09-14 NOTE — Progress Notes (Signed)
Pre visit review using our clinic review tool, if applicable. No additional management support is needed unless otherwise documented below in the visit note. 

## 2014-09-14 NOTE — Patient Instructions (Addendum)
Good to see you.   Ice 20 minutes 2 times daily. Usually after activity and before bed. Continue the exercises 3 times a week.  See me again in 3 weeks and if pain will start orthovisc.

## 2014-09-18 ENCOUNTER — Ambulatory Visit: Payer: Medicare Other | Admitting: Family Medicine

## 2014-10-11 ENCOUNTER — Telehealth: Payer: Self-pay

## 2014-10-11 NOTE — Telephone Encounter (Signed)
Call to the patient and this was Dtr, Karen's number; Permission per Demographic screen. Stated purpose of call to introduce the Medicare Wellness visit. [name] stated that she would talk to her mother and call and schedule.

## 2014-11-13 ENCOUNTER — Ambulatory Visit (INDEPENDENT_AMBULATORY_CARE_PROVIDER_SITE_OTHER): Payer: Medicare Other | Admitting: Family Medicine

## 2014-11-13 ENCOUNTER — Encounter: Payer: Self-pay | Admitting: Family Medicine

## 2014-11-13 VITALS — BP 120/68 | HR 79

## 2014-11-13 DIAGNOSIS — IMO0002 Reserved for concepts with insufficient information to code with codable children: Secondary | ICD-10-CM

## 2014-11-13 DIAGNOSIS — M1991 Primary osteoarthritis, unspecified site: Secondary | ICD-10-CM | POA: Insufficient documentation

## 2014-11-13 DIAGNOSIS — M129 Arthropathy, unspecified: Secondary | ICD-10-CM | POA: Diagnosis not present

## 2014-11-13 NOTE — Assessment & Plan Note (Signed)
Patient given an Orthovisc injection today. Patient tolerated the procedure very well. We discussed icing regimen and home exercises. We discussed what potentially changes to make. Patient will continue with the other conservative therapy and see me in one week for second in a series of 4 injections.

## 2014-11-13 NOTE — Progress Notes (Signed)
  [name] [address] [place], [place] [address] Phone: [phone] Subjective:     CC: Right knee painfollow-up.   [id number] Betty Chan is a 81 y.o. female coming in with complaint of right knee pain. Patient has been having right knee pain for multiple years. Patient is been diagnosed with severe osteophytic changes of the knee previously. Patient was seen 2 months ago and was given an injection, home exercises, bracing as well as an icing protocol. Patient started having worsening pain again. Patient states that the steroid injection does not seem to be helping as long duration. Patient states that this is affecting her daily activities and she has failed all other conservative therapy at this time.     Past medical history, social, surgical and family history all reviewed in electronic medical record.   Review of Systems: No headache, visual changes, nausea, vomiting, diarrhea, constipation, dizziness, abdominal pain, skin rash, fevers, chills, night sweats, weight loss, swollen lymph nodes, body aches, joint swelling, muscle aches, chest pain, shortness of breath, mood changes.   Objective Blood pressure 120/68, pulse 79, SpO2 97 %.  General: No apparent distress alert and oriented x3 mood and affect normal, dressed appropriately.  HEENT: Pupils equal, extraocular movements intact  Respiratory: Patient's speak in full sentences and does not appear short of breath  Cardiovascular: No lower extremity edema, non tender, no erythema  Skin: Warm dry intact with no signs of infection or rash on extremities or on axial skeleton.  Abdomen: Soft nontender  Neuro: Cranial nerves II through XII are intact, neurovascularly intact in all extremities with 2+ DTRs and 2+ pulses.  Lymph: No lymphadenopathy of posterior or anterior cervical chain or axillae bilaterally.  Gait ambulates with an aid of a walker MSK:  Non tender with full range of motion and  good stability and symmetric strength and tone of shoulders, elbows, wrist, hip, and ankles bilaterally. Significant osteophytic changes of multiple joints Knee: Right Significant valgus deformity of the right knee Diffuse tenderness with most tenderness over the medial joint line Range of motion lacks last 5 of extension and flexion Ligaments with solid consistent endpoints including ACL, PCL, LCL, MCL.  painful patellar compression. Patellar glide with moderate crepitus. Patellar and quadriceps tendons unremarkable. Hamstring and quadriceps strength is normal.  Contralateral knee has degenerative changes but is nontender to palpation No change from previous exam  procedure After informed written and verbal consent, patient was seated on exam table. Right knee was prepped with alcohol swab and utilizing anterolateral approach, patient's right knee space was injected with 15 mg/2.5 mL of Orthovisc (sodium hyaluronate) in a prefilled syringe was injected easily into the knee through a 22-gauge needle  Patient tolerated the procedure well without immediate complications.    Impression and Recommendations:     This case required medical decision making of moderate complexity.

## 2014-11-13 NOTE — Patient Instructions (Signed)
Good to see you Betty Chan is your friend Started orthovisc today.  Conitnue the vitamins See you next week.

## 2014-11-13 NOTE — Progress Notes (Signed)
Pre visit review using our clinic review tool, if applicable. No additional management support is needed unless otherwise documented below in the visit note. 

## 2014-11-21 ENCOUNTER — Ambulatory Visit (INDEPENDENT_AMBULATORY_CARE_PROVIDER_SITE_OTHER): Payer: Medicare Other | Admitting: Family Medicine

## 2014-11-21 ENCOUNTER — Encounter: Payer: Self-pay | Admitting: Family Medicine

## 2014-11-21 VITALS — BP 126/78 | HR 74 | Ht 66.0 in | Wt 184.0 lb

## 2014-11-21 DIAGNOSIS — M1711 Unilateral primary osteoarthritis, right knee: Secondary | ICD-10-CM | POA: Diagnosis not present

## 2014-11-21 DIAGNOSIS — IMO0002 Reserved for concepts with insufficient information to code with codable children: Secondary | ICD-10-CM

## 2014-11-21 NOTE — Assessment & Plan Note (Signed)
Second in series of 4 injections given today. Patient will continue conservative therapy and we'll see patient again in 1 week.

## 2014-11-21 NOTE — Progress Notes (Signed)
  [name] [place], [place] [address] Phone: [phone] Subjective:     CC: Right knee painfollow-up.   [id number] Betty Chan is a 81 y.o. female coming in with complaint of right knee pain. Patient has been having right knee pain for multiple years. Patient is been diagnosed with severe osteophytic changes of the knee previously. Patient was seen 2 months ago and was given an injection, home exercises, bracing as well as an icing protocol. Patient started having worsening pain again. Patient states that the steroid injection does not seem to be helping as long duration. Patient states that this is affecting her daily activities and she has failed all other conservative therapy at this time.  Patient is here for the second in a series of 4 injections of viscous supplementation for the knee. Patient states     Past medical history, social, surgical and family history all reviewed in electronic medical record.   Review of Systems: No headache, visual changes, nausea, vomiting, diarrhea, constipation, dizziness, abdominal pain, skin rash, fevers, chills, night sweats, weight loss, swollen lymph nodes, body aches, joint swelling, muscle aches, chest pain, shortness of breath, mood changes.   Objective There were no vitals taken for this visit.  General: No apparent distress alert and oriented x3 mood and affect normal, dressed appropriately.  HEENT: Pupils equal, extraocular movements intact  Respiratory: Patient's speak in full sentences and does not appear short of breath  Cardiovascular: No lower extremity edema, non tender, no erythema  Skin: Warm dry intact with no signs of infection or rash on extremities or on axial skeleton.  Abdomen: Soft nontender  Neuro: Cranial nerves II through XII are intact, neurovascularly intact in all extremities with 2+ DTRs and 2+ pulses.  Lymph: No lymphadenopathy of posterior or anterior cervical  chain or axillae bilaterally.  Gait ambulates with an aid of a walker MSK:  Non tender with full range of motion and good stability and symmetric strength and tone of shoulders, elbows, wrist, hip, and ankles bilaterally. Significant osteophytic changes of multiple joints Knee: Right Significant valgus deformity of the right knee Diffuse tenderness with most tenderness over the medial joint line Range of motion lacks last 5 of extension and flexion Ligaments with solid consistent endpoints including ACL, PCL, LCL, MCL.  painful patellar compression. Patellar glide with moderate crepitus. Patellar and quadriceps tendons unremarkable. Hamstring and quadriceps strength is normal.  Contralateral knee has degenerative changes but is nontender to palpation No change from previous exam  procedure After informed written and verbal consent, patient was seated on exam table. Right knee was prepped with alcohol swab and utilizing anterolateral approach, patient's right knee space was injected with 15 mg/2.5 mL of Orthovisc (sodium hyaluronate) in a prefilled syringe was injected easily into the knee through a 22-gauge needle  Patient tolerated the procedure well without immediate complications.    Impression and Recommendations:     This case required medical decision making of moderate complexity.

## 2014-11-21 NOTE — Patient Instructions (Signed)
Good to see you We did the second injection today Continue with icing and the exercises See me again next week for number 3

## 2014-11-21 NOTE — Progress Notes (Signed)
Pre visit review using our clinic review tool, if applicable. No additional management support is needed unless otherwise documented below in the visit note. 

## 2014-11-23 ENCOUNTER — Telehealth: Payer: Self-pay | Admitting: Family Medicine

## 2014-11-23 NOTE — Telephone Encounter (Signed)
States patient is scheduled for Monday for injection.  Did notify that provider was full.  Daughter wants moved to 1:00 or 1:30 on Tuesday the 7th.

## 2014-11-23 NOTE — Telephone Encounter (Signed)
Spoke to pt's daughter. She is going to keep appt on Monday.

## 2014-11-24 ENCOUNTER — Emergency Department (HOSPITAL_COMMUNITY): Payer: Medicare Other

## 2014-11-24 ENCOUNTER — Telehealth: Payer: Self-pay | Admitting: Internal Medicine

## 2014-11-24 ENCOUNTER — Observation Stay (HOSPITAL_COMMUNITY)
Admission: EM | Admit: 2014-11-24 | Discharge: 2014-11-26 | Disposition: A | Discharge status: Home / Self Care | Payer: Medicare Other | Attending: Internal Medicine | Admitting: Internal Medicine

## 2014-11-24 ENCOUNTER — Other Ambulatory Visit (HOSPITAL_COMMUNITY): Payer: Self-pay

## 2014-11-24 ENCOUNTER — Encounter (HOSPITAL_COMMUNITY): Payer: Self-pay | Admitting: Emergency Medicine

## 2014-11-24 DIAGNOSIS — Z8619 Personal history of other infectious and parasitic diseases: Secondary | ICD-10-CM | POA: Diagnosis not present

## 2014-11-24 DIAGNOSIS — Y998 Other external cause status: Secondary | ICD-10-CM | POA: Diagnosis not present

## 2014-11-24 DIAGNOSIS — S0003XA Contusion of scalp, initial encounter: Secondary | ICD-10-CM | POA: Diagnosis not present

## 2014-11-24 DIAGNOSIS — Y92129 Unspecified place in nursing home as the place of occurrence of the external cause: Secondary | ICD-10-CM | POA: Insufficient documentation

## 2014-11-24 DIAGNOSIS — K219 Gastro-esophageal reflux disease without esophagitis: Secondary | ICD-10-CM | POA: Diagnosis not present

## 2014-11-24 DIAGNOSIS — I10 Essential (primary) hypertension: Secondary | ICD-10-CM | POA: Diagnosis present

## 2014-11-24 DIAGNOSIS — Z952 Presence of prosthetic heart valve: Secondary | ICD-10-CM

## 2014-11-24 DIAGNOSIS — I5040 Unspecified combined systolic (congestive) and diastolic (congestive) heart failure: Secondary | ICD-10-CM

## 2014-11-24 DIAGNOSIS — M1710 Unilateral primary osteoarthritis, unspecified knee: Secondary | ICD-10-CM | POA: Insufficient documentation

## 2014-11-24 DIAGNOSIS — M25561 Pain in right knee: Secondary | ICD-10-CM

## 2014-11-24 DIAGNOSIS — Z7982 Long term (current) use of aspirin: Secondary | ICD-10-CM | POA: Insufficient documentation

## 2014-11-24 DIAGNOSIS — W1839XA Other fall on same level, initial encounter: Secondary | ICD-10-CM | POA: Diagnosis not present

## 2014-11-24 DIAGNOSIS — Z8673 Personal history of transient ischemic attack (TIA), and cerebral infarction without residual deficits: Secondary | ICD-10-CM | POA: Insufficient documentation

## 2014-11-24 DIAGNOSIS — N3281 Overactive bladder: Secondary | ICD-10-CM | POA: Diagnosis present

## 2014-11-24 DIAGNOSIS — Z954 Presence of other heart-valve replacement: Secondary | ICD-10-CM

## 2014-11-24 DIAGNOSIS — S0093XA Contusion of unspecified part of head, initial encounter: Secondary | ICD-10-CM

## 2014-11-24 DIAGNOSIS — W19XXXA Unspecified fall, initial encounter: Secondary | ICD-10-CM

## 2014-11-24 DIAGNOSIS — R778 Other specified abnormalities of plasma proteins: Secondary | ICD-10-CM

## 2014-11-24 DIAGNOSIS — Z85828 Personal history of other malignant neoplasm of skin: Secondary | ICD-10-CM | POA: Diagnosis not present

## 2014-11-24 DIAGNOSIS — Y9389 Activity, other specified: Secondary | ICD-10-CM | POA: Insufficient documentation

## 2014-11-24 DIAGNOSIS — Z853 Personal history of malignant neoplasm of breast: Secondary | ICD-10-CM | POA: Diagnosis not present

## 2014-11-24 DIAGNOSIS — L989 Disorder of the skin and subcutaneous tissue, unspecified: Secondary | ICD-10-CM

## 2014-11-24 DIAGNOSIS — Z79899 Other long term (current) drug therapy: Secondary | ICD-10-CM | POA: Diagnosis not present

## 2014-11-24 DIAGNOSIS — M8580 Other specified disorders of bone density and structure, unspecified site: Secondary | ICD-10-CM | POA: Insufficient documentation

## 2014-11-24 DIAGNOSIS — R011 Cardiac murmur, unspecified: Secondary | ICD-10-CM | POA: Insufficient documentation

## 2014-11-24 DIAGNOSIS — S0990XA Unspecified injury of head, initial encounter: Secondary | ICD-10-CM

## 2014-11-24 DIAGNOSIS — E785 Hyperlipidemia, unspecified: Secondary | ICD-10-CM | POA: Diagnosis not present

## 2014-11-24 DIAGNOSIS — R7989 Other specified abnormal findings of blood chemistry: Secondary | ICD-10-CM | POA: Insufficient documentation

## 2014-11-24 DIAGNOSIS — E039 Hypothyroidism, unspecified: Secondary | ICD-10-CM | POA: Diagnosis not present

## 2014-11-24 DIAGNOSIS — S51802A Unspecified open wound of left forearm, initial encounter: Secondary | ICD-10-CM | POA: Insufficient documentation

## 2014-11-24 DIAGNOSIS — R9431 Abnormal electrocardiogram [ECG] [EKG]: Secondary | ICD-10-CM

## 2014-11-24 DIAGNOSIS — I5042 Chronic combined systolic (congestive) and diastolic (congestive) heart failure: Secondary | ICD-10-CM

## 2014-11-24 DIAGNOSIS — Z880 Allergy status to penicillin: Secondary | ICD-10-CM | POA: Diagnosis not present

## 2014-11-24 DIAGNOSIS — D649 Anemia, unspecified: Secondary | ICD-10-CM | POA: Insufficient documentation

## 2014-11-24 HISTORY — DX: Malignant neoplasm of unspecified site of right female breast: C50.911

## 2014-11-24 HISTORY — DX: Basal cell carcinoma of skin, unspecified: C44.91

## 2014-11-24 HISTORY — DX: Essential (primary) hypertension: I10

## 2014-11-24 HISTORY — DX: Cardiac murmur, unspecified: R01.1

## 2014-11-24 HISTORY — DX: Squamous cell carcinoma of skin of unspecified upper limb, including shoulder: C44.621

## 2014-11-24 LAB — I-STAT CHEM 8, ED
BUN: 25 mg/dL — AB (ref 6–20)
Calcium, Ion: 1.15 mmol/L (ref 1.13–1.30)
Chloride: 102 mmol/L (ref 101–111)
Creatinine, Ser: 1 mg/dL (ref 0.44–1.00)
Glucose, Bld: 106 mg/dL — ABNORMAL HIGH (ref 65–99)
HCT: 37 % (ref 36.0–46.0)
Hemoglobin: 12.6 g/dL (ref 12.0–15.0)
POTASSIUM: 3.8 mmol/L (ref 3.5–5.1)
Sodium: 140 mmol/L (ref 135–145)
TCO2: 24 mmol/L (ref 0–100)

## 2014-11-24 LAB — CBC
HCT: 35.6 % — ABNORMAL LOW (ref 36.0–46.0)
Hemoglobin: 11.8 g/dL — ABNORMAL LOW (ref 12.0–15.0)
MCH: 32.1 pg (ref 26.0–34.0)
MCHC: 33.1 g/dL (ref 30.0–36.0)
MCV: 96.7 fL (ref 78.0–100.0)
Platelets: 199 10*3/uL (ref 150–400)
RBC: 3.68 MIL/uL — ABNORMAL LOW (ref 3.87–5.11)
RDW: 12.7 % (ref 11.5–15.5)
WBC: 9.2 10*3/uL (ref 4.0–10.5)

## 2014-11-24 LAB — TROPONIN I: TROPONIN I: 0.81 ng/mL — AB (ref <0.031)

## 2014-11-24 LAB — MAGNESIUM: Magnesium: 2 mg/dL (ref 1.7–2.4)

## 2014-11-24 LAB — I-STAT TROPONIN, ED: Troponin i, poc: 0.81 ng/mL (ref 0.00–0.08)

## 2014-11-24 LAB — TSH: TSH: 1.002 u[IU]/mL (ref 0.350–4.500)

## 2014-11-24 LAB — BRAIN NATRIURETIC PEPTIDE: B Natriuretic Peptide: 268.9 pg/mL — ABNORMAL HIGH (ref 0.0–100.0)

## 2014-11-24 MED ORDER — ONDANSETRON HCL 4 MG/2ML IJ SOLN: 4.0000 mg | Freq: Four times a day (QID) | INTRAMUSCULAR | Status: DC | PRN

## 2014-11-24 MED ORDER — TETANUS-DIPHTH-ACELL PERTUSSIS 5-2.5-18.5 LF-MCG/0.5 IM SUSP
0.5000 mL | Freq: Once | INTRAMUSCULAR | Status: DC
Start: 2014-11-24 — End: 2014-11-26
  Filled 2014-11-24: qty 0.5

## 2014-11-24 MED ORDER — ALUM & MAG HYDROXIDE-SIMETH 200-200-20 MG/5ML PO SUSP: 30.0000 mL | Freq: Four times a day (QID) | ORAL | Status: DC | PRN

## 2014-11-24 MED ORDER — ACETAMINOPHEN 325 MG PO TABS
650.0000 mg | ORAL_TABLET | Freq: Four times a day (QID) | ORAL | Status: DC | PRN
Start: 2014-11-24 — End: 2014-11-26
  Administered 2014-11-25 – 2014-11-26 (×2): 650 mg via ORAL
  Filled 2014-11-24 (×2): qty 2

## 2014-11-24 MED ORDER — LEVOTHYROXINE SODIUM 50 MCG PO TABS
50.0000 ug | ORAL_TABLET | Freq: Every day | ORAL | Status: DC
  Administered 2014-11-26: 50 ug via ORAL
  Filled 2014-11-24: qty 1

## 2014-11-24 MED ORDER — HEPARIN SODIUM (PORCINE) 5000 UNIT/ML IJ SOLN
5000.0000 [IU] | Freq: Three times a day (TID) | INTRAMUSCULAR | Status: DC
  Administered 2014-11-24 – 2014-11-26 (×5): 5000 [IU] via SUBCUTANEOUS
  Filled 2014-11-24 (×3): qty 1

## 2014-11-24 MED ORDER — ACETAMINOPHEN 325 MG PO TABS
650.0000 mg | ORAL_TABLET | Freq: Once | ORAL | Status: AC
  Administered 2014-11-24: 650 mg via ORAL
  Filled 2014-11-24: qty 2

## 2014-11-24 MED ORDER — PANTOPRAZOLE SODIUM 40 MG PO TBEC
40.0000 mg | DELAYED_RELEASE_TABLET | Freq: Every day | ORAL | Status: DC
  Administered 2014-11-24 – 2014-11-26 (×3): 40 mg via ORAL
  Filled 2014-11-24 (×3): qty 1

## 2014-11-24 MED ORDER — METOPROLOL TARTRATE 12.5 MG HALF TABLET
12.5000 mg | ORAL_TABLET | Freq: Two times a day (BID) | ORAL | Status: DC
  Administered 2014-11-24 – 2014-11-26 (×4): 12.5 mg via ORAL
  Filled 2014-11-24 (×4): qty 1

## 2014-11-24 MED ORDER — SODIUM CHLORIDE 0.9 % IJ SOLN
3.0000 mL | Freq: Two times a day (BID) | INTRAMUSCULAR | Status: DC
  Administered 2014-11-24 – 2014-11-26 (×4): 3 mL via INTRAVENOUS

## 2014-11-24 MED ORDER — ACETAMINOPHEN 650 MG RE SUPP: 650.0000 mg | Freq: Four times a day (QID) | RECTAL | Status: DC | PRN

## 2014-11-24 MED ORDER — OXYBUTYNIN CHLORIDE 5 MG PO TABS
5.0000 mg | ORAL_TABLET | Freq: Two times a day (BID) | ORAL | Status: DC
  Administered 2014-11-24 – 2014-11-26 (×4): 5 mg via ORAL
  Filled 2014-11-24 (×4): qty 1

## 2014-11-24 MED ORDER — ONDANSETRON HCL 4 MG PO TABS: 4.0000 mg | ORAL_TABLET | Freq: Four times a day (QID) | ORAL | Status: DC | PRN

## 2014-11-24 MED ORDER — ASPIRIN EC 81 MG PO TBEC
81.0000 mg | DELAYED_RELEASE_TABLET | Freq: Every day | ORAL | Status: DC
  Administered 2014-11-24 – 2014-11-26 (×3): 81 mg via ORAL
  Filled 2014-11-24 (×3): qty 1

## 2014-11-24 MED ORDER — SODIUM CHLORIDE 0.9 % IV SOLN
INTRAVENOUS | Status: DC
  Administered 2014-11-24: 19:00:00 via INTRAVENOUS

## 2014-11-24 MED ORDER — ATORVASTATIN CALCIUM 20 MG PO TABS
20.0000 mg | ORAL_TABLET | Freq: Every day | ORAL | Status: DC
  Administered 2014-11-24 – 2014-11-25 (×2): 20 mg via ORAL
  Filled 2014-11-24 (×2): qty 1

## 2014-11-24 MED ORDER — LISINOPRIL 5 MG PO TABS
5.0000 mg | ORAL_TABLET | Freq: Every day | ORAL | Status: DC
  Administered 2014-11-24 – 2014-11-26 (×3): 5 mg via ORAL
  Filled 2014-11-24 (×3): qty 1

## 2014-11-24 NOTE — H&P (Signed)
[facility] History and Physical                                                                                    Betty Chan, is a 81 y.o. female  MRN: [id number]   DOB - October 03, 1929  Admit Date - 11/24/2014  Outpatient Primary MD for the patient is [name], MD  Referring Physician:    Chief Complaint:   Chief Complaint  Patient presents with  . Fall     HPI  Betty Chan  is a 81 y.o. female, with a pmh of prosthetic aortic valve replacement (at [facility]), breast cancer, hypothyroidism, and hypertension. She comes to the emergency department today from her assisted living facility after a fall. The patient reports that she had 2 teeth extracted yesterday and consequently ate very little all-day yesterday. This morning she got up and took a shower. Afterward she was drying off became wobbly and fell hitting her head on the door as she went down. She did not lose consciousness. Initially she told the ER that she was dizzy. During my interview she denied dizziness. She pressed her life alert button, and EMS brought her to the ER. She denies dizziness, headache, double vision, recent illness, chest pain, shortness of breath, palpitations, recent changes in her medications.  The patient notes that she has had some lower extremity swelling for several months. She had a recent 2-D echo done at [facility]. Otherwise she feels well.   In the emergency department she was found to have a soft tissue swelling of her scalp on head CT, but no other acute abnormalities. A point-of-care troponin was checked due to dizziness and unfortunately was elevated at 0.81.  Review of Systems   In addition to the HPI above,  No Fever-chills, No Headache, No changes with Vision or hearing, No problems swallowing food or Liquids, No Chest pain, Cough or Shortness of Breath, No Abdominal pain, No Nausea or Vomiting, Bowel movements are regular, No Blood in stool or Urine, No dysuria.  She has chronic  urinary frequency.  No new skin rashes or bruises, No new joints pains-aches,  No new weakness, tingling, numbness in any extremity, No recent weight gain or loss, A full 10 point Review of Systems was done, except as stated above, all other Review of Systems were negative.  Past Medical History  Past Medical History  Diagnosis Date  . Aortic stenosis     s/p TAVR 08/2013 -follows at [facility])  . TIA (transient ischemic attack)   . Breast cancer 2004    lumpectomy  . Seizure 2007    single event, off Keppra since 11/2013  . OA (osteoarthritis) of knee   . Hypothyroid   . Dyslipidemia   . PHN (postherpetic neuralgia)     L anterior thigh 2007, recurrent 04/2013  . GERD (gastroesophageal reflux disease)   . Osteopenia 02/13/2014    DEXA 01/2014: -2.2  . Hypertension     Past Surgical History  Procedure Laterality Date  . Breast biopsy  2004    lumpectomy  . Transcatheter aortic valve replacement, transfemoral  09/06/13    [facility]  Social History History  Substance Use Topics  . Smoking status: Never Smoker   . Smokeless tobacco: Not on file     Comment: widowed, lives in [facility]   . Alcohol Use: Yes  Lives in assisted living. Is mostly independent with her ADLs. Walks with a walker.   Family History Family History  Problem Relation Age of Onset  . Prostate cancer Father   . Breast cancer Sister   She knows of no history of heart disease. Her mother lived to be 14 years old.   Prior to Admission medications   Medication Sig Start Date End Date Taking? Authorizing Provider  acetaminophen (TYLENOL) 500 MG tablet Take 1 tablet (500 mg total) by mouth every 6 (six) hours as needed for fever. Patient taking differently: Take 500 mg by mouth every 8 (eight) hours.  06/26/14  Yes Rowe Clack, MD  aspirin 81 MG tablet Take 81 mg by mouth daily.   Yes Historical Provider, MD  atorvastatin (LIPITOR) 20 MG tablet TAKE 1 TABLET BY MOUTH EVERY DAY AT 6 PM  07/25/14  Yes Rowe Clack, MD  Biotin 5000 MCG CAPS Take 5,000 mcg by mouth daily.   Yes Historical Provider, MD  cholecalciferol (VITAMIN D) 1000 UNITS tablet Take 1,000 Units by mouth 2 (two) times daily.    Yes Historical Provider, MD  levothyroxine (SYNTHROID, LEVOTHROID) 50 MCG tablet Take 1 tablet (50 mcg total) by mouth daily before breakfast. 03/28/14  Yes Rowe Clack, MD  lisinopril (PRINIVIL,ZESTRIL) 5 MG tablet TAKE 1 TABLET BY MOUTH EVERY DAY 08/18/14  Yes Rowe Clack, MD  metoprolol tartrate (LOPRESSOR) 25 MG tablet Take 0.5 tablets (12.5 mg total) by mouth 2 (two) times daily. 06/26/14  Yes Rowe Clack, MD  Multiple Vitamins-Minerals (CENTRUM FLAVOR BURST ADULT) CHEW Chew 2 tablets by mouth daily.   Yes Historical Provider, MD  omeprazole (PRILOSEC) 20 MG capsule Take 1 capsule (20 mg total) by mouth daily. 03/28/14  Yes Rowe Clack, MD  OVER THE COUNTER MEDICATION Apply 1 application topically daily. Pennsaid   Yes Historical Provider, MD  oxybutynin (DITROPAN) 5 MG tablet Take 5 mg by mouth 2 (two) times daily.   Yes Historical Provider, MD  traMADol (ULTRAM) 50 MG tablet Take 1 tablet (50 mg total) by mouth every 6 (six) hours as needed for moderate pain. 06/26/14   Rowe Clack, MD    Allergies  Allergen Reactions  . Amoxicillin Rash  . Penicillins Rash    Physical Exam  Vitals  Blood pressure 97/60, pulse 84, temperature 98.1 F (36.7 C), temperature source Oral, resp. rate 12, height 5\' 3"  (1.6 m), weight 83.462 kg (184 lb), SpO2 99 %.   General:  well-developed, well-nourished, pleasant female  lying in bed in NAD, Appears stated age. Daughter and son-in-law bedside   Psych:  Normal affect and insight, Not Suicidal or Homicidal, Awake Alert, Oriented X 3.  Neuro:   No F.N deficits, ALL C.Nerves Intact, Strength 5/5 all 4 extremities, Sensation intact all 4 extremities.  ENT:  Ears and Eyes appear Normal, Conjunctivae clear, PER.  Moist oral mucosa without erythema or exudates.  Neck:  Supple, No lymphadenopathy appreciated  Respiratory:  Symmetrical chest wall movement, Good air movement bilaterally, CTAB.  Cardiac:  RRR, No Murmurs, no JVD.  ++ Bilateral 2+ lower extremity edema. Bilateral upper extremity anasarca (mild)   Abdomen:  Positive bowel sounds, Soft, Non tender, Non distended,  No masses appreciated  Skin:  No Cyanosis, Normal Skin Turgor, No Skin Rash or Bruise.  Extremities:  Able to move all 4. 5/5 strength in each,  no effusions.  Data Review  Results for SARABELLE, GENSON (MRN [id number]) as of 11/24/2014 16:56  Ref. Range 11/24/2014 14:55  Troponin i, poc Latest Ref Range: 0.00-0.08 ng/mL 0.81 (HH)    CBC  Recent Labs Lab 11/24/14 1442 11/24/14 1456  WBC 9.2  --   HGB 11.8* 12.6  HCT 35.6* 37.0  PLT 199  --   MCV 96.7  --   MCH 32.1  --   MCHC 33.1  --   RDW 12.7  --     Chemistries   Recent Labs Lab 11/24/14 1456  NA 140  K 3.8  CL 102  GLUCOSE 106*  BUN 25*  CREATININE 1.00     Urinalysis Pending   Imaging results:   Ct Head Wo Contrast  11/24/2014   CLINICAL DATA:  Unwitnessed fall. Found on the floor. Hematoma to the back of the head. Initial encounter.  EXAM: CT HEAD WITHOUT CONTRAST  CT CERVICAL SPINE WITHOUT CONTRAST  TECHNIQUE: Multidetector CT imaging of the head and cervical spine was performed following the standard protocol without intravenous contrast. Multiplanar CT image reconstructions of the cervical spine were also generated.  COMPARISON:  None.  FINDINGS: CT HEAD FINDINGS  Soft tissue swelling is present near the vertex to the right of midline. There is no underlying fracture or gas within the soft tissues to suggest a significant laceration.  Moderate generalized atrophy and white matter disease is present. No acute cortical infarct or hemorrhage is present. There is no mass lesion. The ventricles are of normal size. Insert pass fluid  The paranasal  sinuses and mastoid air cells are clear. The calvarium is intact.  CT CERVICAL SPINE FINDINGS  Cervical spine is imaged from the skullbase through T1-2. Grade 1 anterolisthesis is present at C6-7 and C7-T1. Vertebral body heights and alignment are maintained. No acute fracture or traumatic subluxation is evident.  Soft tissues of the neck demonstrate no focal mass lesion. The lung apices are clear.  IMPRESSION: 1. Scalp soft tissue swelling to the right of midline near the vertex without an underlying fracture. 2. Mild generalized atrophy and white matter disease is within normal limits for age. 3. No acute intracranial abnormality.   Electronically Signed   By: San Morelle M.D.   On: 11/24/2014 14:30   Ct Cervical Spine Wo Contrast  11/24/2014   CLINICAL DATA:  Unwitnessed fall. Found on the floor. Hematoma to the back of the head. Initial encounter.  EXAM: CT HEAD WITHOUT CONTRAST  CT CERVICAL SPINE WITHOUT CONTRAST  TECHNIQUE: Multidetector CT imaging of the head and cervical spine was performed following the standard protocol without intravenous contrast. Multiplanar CT image reconstructions of the cervical spine were also generated.  COMPARISON:  None.  FINDINGS: CT HEAD FINDINGS  Soft tissue swelling is present near the vertex to the right of midline. There is no underlying fracture or gas within the soft tissues to suggest a significant laceration.  Moderate generalized atrophy and white matter disease is present. No acute cortical infarct or hemorrhage is present. There is no mass lesion. The ventricles are of normal size. Insert pass fluid  The paranasal sinuses and mastoid air cells are clear. The calvarium is intact.  CT CERVICAL SPINE FINDINGS  Cervical spine is imaged from the skullbase through T1-2. Grade 1 anterolisthesis is present at C6-7 and C7-T1. Vertebral body heights  and alignment are maintained. No acute fracture or traumatic subluxation is evident.  Soft tissues of the neck  demonstrate no focal mass lesion. The lung apices are clear.  IMPRESSION: 1. Scalp soft tissue swelling to the right of midline near the vertex without an underlying fracture. 2. Mild generalized atrophy and white matter disease is within normal limits for age. 3. No acute intracranial abnormality.   Electronically Signed   By: San Morelle M.D.   On: 11/24/2014 14:30    My personal review of EKG: Sinus rhythm, prolonged QT.  No obvious ST elevation or depression.   Assessment & Plan  Principal Problem:   Elevated troponin Active Problems:   Fall   Scalp lesion   Hypothyroid   Dyslipidemia   GERD (gastroesophageal reflux disease)   OAB (overactive bladder)   History of breast cancer   BP (high blood pressure)   H/O aortic valve replacement   Heart failure, systolic and diastolic   Prolonged QT interval   Elevated troponin Uncertain etiology. Patient denies chest pain. There are no obvious EKG changes. We will cycle troponin, check TSH, BNP. The patient had a recent 2-D echo at [facility] that showed an EF of 45% and grade 1 diastolic dysfunction. I will not repeat the 2- D echo. Continue 81 mg aspirin. I anticipate that if her troponins are negative she will will be able to discharge 6/4.  Fall does not sound like a presyncopal episode. Also does not sound of mechanical fall. Physical therapy evaluation ordered will check orthostatic vitals. Patient had a have been mildly dehydrated we'll give gentle IV fluids. CT head negative for acute changes other than soft tissue swelling.  Monitor on telemetry.  Hypothyroidism Continue Synthroid. Check TSH.  HTN Continue lisinopril, metoprolol. Blood pressure has varied in the ED. Please adjust accordingly.  Hyperlipidemia Continue atorvastatin  Scalp lesion due to fall  Nursing care.  GERD PPI  Mixed Heart Failure Recent Echo at [facility] in March EF of 45% and grade 1 diastolic dysfunction. Patient with extremity edema - refuses  lasix as she feels she already has difficulty with urinating. On metoprolol/ Ace - I.  Will place on low salt diet, Is&Os, daily weights.  Checked BNP. Continue management via Cardiologist at [facility].  Prolonged QT Discontinue Zofran. Check magnesium level.  Consultants Called:  none  Family Communication:   dtr and son in law at bedside  Code Status:  Full code  Condition:  Stable.  Potential Disposition: return to assisted living 6/4 if work up is negative.  Time spent in minutes : [place],  PA-C on 11/24/2014 at 5:33 PM Between 7am to 7pm - Pager - [phone] After 7pm go to [email or web address] - password [email or web address] And look for the night coverage person covering me after hours  [facility]

## 2014-11-24 NOTE — ED Notes (Signed)
Pt was taken to restroom with help of "steady Clarise Cruz".

## 2014-11-24 NOTE — ED Notes (Signed)
PA AT BEDSIDE  

## 2014-11-24 NOTE — Telephone Encounter (Signed)
Patient's daughter called just to let you know that patient has fallen and hit her head and she is being taken to the hospital.

## 2014-11-24 NOTE — ED Provider Notes (Signed)
CSN: [id number]     Arrival date & time 11/24/14  1239 History   First MD Initiated Contact with Patient 11/24/14 1245     Chief Complaint  Patient presents with  . Fall     (Consider location/radiation/quality/duration/timing/severity/associated sxs/prior Treatment) HPI  Betty Chan is a 81 y.o. female with PMH of aortic stenosis with valve replacement 2015, TIA, HTN presenting with fall at nursing facility. Pt states she has not eaten today and she took a shower and was standing at mirror doing make up and felt weak in bilateral lower extremities and fell backward. Pt with left head hematoma. Pt denies LOC. Pt not complaining of any pain. No headache, visual changes, slurred speech or weakness. No chest pain, SOB, nausea, vomiting, abdominal pain. Pt states tetanus over 10 years ago. No history of CHF. No anticoagulants. Pt takes 324 aspirin daily.   Past Medical History  Diagnosis Date  . Aortic stenosis     s/p TAVR 08/2013 -follows at [facility])  . TIA (transient ischemic attack)   . Breast cancer 2004    lumpectomy  . Seizure 2007    single event, off Keppra since 11/2013  . OA (osteoarthritis) of knee   . Hypothyroid   . Dyslipidemia   . PHN (postherpetic neuralgia)     L anterior thigh 2007, recurrent 04/2013  . GERD (gastroesophageal reflux disease)   . Osteopenia 02/13/2014    DEXA 01/2014: -2.2  . Hypertension    Past Surgical History  Procedure Laterality Date  . Breast biopsy  2004    lumpectomy  . Transcatheter aortic valve replacement, transfemoral  09/06/13    [facility]   Family History  Problem Relation Age of Onset  . Prostate cancer Father   . Breast cancer Sister    History  Substance Use Topics  . Smoking status: Never Smoker   . Smokeless tobacco: Not on file     Comment: widowed, lives in [facility]   . Alcohol Use: Yes   OB History    No data available     Review of Systems 10 Systems reviewed and are negative for acute change except  as noted in the HPI.    Allergies  Amoxicillin and Penicillins  Home Medications   Prior to Admission medications   Medication Sig Start Date End Date Taking? Authorizing Provider  acetaminophen (TYLENOL) 500 MG tablet Take 1 tablet (500 mg total) by mouth every 6 (six) hours as needed for fever. 06/26/14   Rowe Clack, MD  aspirin 81 MG tablet Take 81 mg by mouth daily.    Historical Provider, MD  atorvastatin (LIPITOR) 20 MG tablet TAKE 1 TABLET BY MOUTH EVERY DAY AT 6 PM 07/25/14   Rowe Clack, MD  Biotin 5000 MCG CAPS Take 5,000 mcg by mouth daily.    Historical Provider, MD  cholecalciferol (VITAMIN D) 1000 UNITS tablet Take 2,000 Units by mouth daily.    Historical Provider, MD  levothyroxine (SYNTHROID, LEVOTHROID) 50 MCG tablet Take 1 tablet (50 mcg total) by mouth daily before breakfast. 03/28/14   Rowe Clack, MD  lisinopril (PRINIVIL,ZESTRIL) 5 MG tablet TAKE 1 TABLET BY MOUTH EVERY DAY 08/18/14   Rowe Clack, MD  metoprolol tartrate (LOPRESSOR) 25 MG tablet Take 0.5 tablets (12.5 mg total) by mouth 2 (two) times daily. 06/26/14   Rowe Clack, MD  Multiple Vitamins-Minerals (CENTRUM FLAVOR BURST ADULT) CHEW Chew 2 tablets by mouth daily.    Historical Provider,  MD  omeprazole (PRILOSEC) 20 MG capsule Take 1 capsule (20 mg total) by mouth daily. 03/28/14   Rowe Clack, MD  oxybutynin (DITROPAN) 5 MG tablet Take 5 mg by mouth 2 (two) times daily.    Historical Provider, MD  traMADol (ULTRAM) 50 MG tablet Take 1 tablet (50 mg total) by mouth every 6 (six) hours as needed for moderate pain. 06/26/14   Rowe Clack, MD   BP 140/80 mmHg  Pulse 87  Temp(Src) 98.1 F (36.7 C) (Oral)  Resp 14  Ht 5\' 3"  (1.6 m)  Wt 184 lb (83.462 kg)  BMI 32.60 kg/m2  SpO2 98% Physical Exam  Constitutional: She is oriented to person, place, and time. She appears well-developed and well-nourished. No distress.  HENT:  Head: Normocephalic.  Mouth/Throat:  Oropharynx is clear and moist.  Left scalp hematoma without laceration or wound. No mid face tenderness or malocclusion   Eyes: Conjunctivae and EOM are normal. Pupils are equal, round, and reactive to light. Right eye exhibits no discharge. Left eye exhibits no discharge.  Neck:  No significant C-spine tenderness. C-collar in place.  Cardiovascular: Normal rate and regular rhythm.   1+ pretibial pitting edema.  Pulmonary/Chest: Effort normal and breath sounds normal. No respiratory distress. She has no wheezes.  Abdominal: Soft. Bowel sounds are normal. She exhibits no distension. There is no tenderness.  Musculoskeletal:  No significant spine tenderness. No step off or crepitus.  Neurological: She is alert and oriented to person, place, and time. No cranial nerve deficit. She exhibits normal muscle tone. Coordination normal.  Speech is clear and goal oriented. Strength 5/5 in upper and lower extremities. Sensation intact.  No pronator drift. Pt stands with assistance without lightheadedness.   Skin: Skin is warm and dry. She is not diaphoretic.  1 cm left dorsal forearm skin tear with bleeding controlled. No gross contamination  Nursing note and vitals reviewed.   ED Course  Procedures (including critical care time) Labs Review Labs Reviewed  CBC - Abnormal; Notable for the following:    RBC 3.68 (*)    Hemoglobin 11.8 (*)    HCT 35.6 (*)    All other components within normal limits  I-STAT CHEM 8, ED - Abnormal; Notable for the following:    BUN 25 (*)    Glucose, Bld 106 (*)    All other components within normal limits  I-STAT TROPOININ, ED - Abnormal; Notable for the following:    Troponin i, poc 0.81 (*)    All other components within normal limits  URINALYSIS, ROUTINE W REFLEX MICROSCOPIC (NOT AT [facility])    Imaging Review Ct Head Wo Contrast  11/24/2014   CLINICAL DATA:  Unwitnessed fall. Found on the floor. Hematoma to the back of the head. Initial encounter.  EXAM: CT  HEAD WITHOUT CONTRAST  CT CERVICAL SPINE WITHOUT CONTRAST  TECHNIQUE: Multidetector CT imaging of the head and cervical spine was performed following the standard protocol without intravenous contrast. Multiplanar CT image reconstructions of the cervical spine were also generated.  COMPARISON:  None.  FINDINGS: CT HEAD FINDINGS  Soft tissue swelling is present near the vertex to the right of midline. There is no underlying fracture or gas within the soft tissues to suggest a significant laceration.  Moderate generalized atrophy and white matter disease is present. No acute cortical infarct or hemorrhage is present. There is no mass lesion. The ventricles are of normal size. Insert pass fluid  The paranasal sinuses and mastoid air  cells are clear. The calvarium is intact.  CT CERVICAL SPINE FINDINGS  Cervical spine is imaged from the skullbase through T1-2. Grade 1 anterolisthesis is present at C6-7 and C7-T1. Vertebral body heights and alignment are maintained. No acute fracture or traumatic subluxation is evident.  Soft tissues of the neck demonstrate no focal mass lesion. The lung apices are clear.  IMPRESSION: 1. Scalp soft tissue swelling to the right of midline near the vertex without an underlying fracture. 2. Mild generalized atrophy and white matter disease is within normal limits for age. 3. No acute intracranial abnormality.   Electronically Signed   By: San Morelle M.D.   On: 11/24/2014 14:30   Ct Cervical Spine Wo Contrast  11/24/2014   CLINICAL DATA:  Unwitnessed fall. Found on the floor. Hematoma to the back of the head. Initial encounter.  EXAM: CT HEAD WITHOUT CONTRAST  CT CERVICAL SPINE WITHOUT CONTRAST  TECHNIQUE: Multidetector CT imaging of the head and cervical spine was performed following the standard protocol without intravenous contrast. Multiplanar CT image reconstructions of the cervical spine were also generated.  COMPARISON:  None.  FINDINGS: CT HEAD FINDINGS  Soft tissue  swelling is present near the vertex to the right of midline. There is no underlying fracture or gas within the soft tissues to suggest a significant laceration.  Moderate generalized atrophy and white matter disease is present. No acute cortical infarct or hemorrhage is present. There is no mass lesion. The ventricles are of normal size. Insert pass fluid  The paranasal sinuses and mastoid air cells are clear. The calvarium is intact.  CT CERVICAL SPINE FINDINGS  Cervical spine is imaged from the skullbase through T1-2. Grade 1 anterolisthesis is present at C6-7 and C7-T1. Vertebral body heights and alignment are maintained. No acute fracture or traumatic subluxation is evident.  Soft tissues of the neck demonstrate no focal mass lesion. The lung apices are clear.  IMPRESSION: 1. Scalp soft tissue swelling to the right of midline near the vertex without an underlying fracture. 2. Mild generalized atrophy and white matter disease is within normal limits for age. 3. No acute intracranial abnormality.   Electronically Signed   By: San Morelle M.D.   On: 11/24/2014 14:30     EKG Interpretation   Date/Time:  Friday November 24 2014 13:39:29 EDT Ventricular Rate:  91 PR Interval:  182 QRS Duration: 86 QT Interval:  392 QTC Calculation: 482 R Axis:   50 Text Interpretation:  Normal sinus rhythm Nonspecific T wave abnormality  Prolonged QT Abnormal ECG No significant change since last tracing  Confirmed by Winfred Leeds  MD, SAM [phone]) on 11/24/2014 1:47:50 PM      MDM   Final diagnoses:  Head injury  Fall  Traumatic hematoma of head, initial encounter  Elevated troponin   Pt presenting after fall with prolonged standing after not eating today or yesterday evening due to dental procedure. Pt reported weakness in bilateral knees but no pain or chest pain. Pt with head hematoma and neurological exam without deficits. VSS. CT head and neck without evidence of acute abnormalities. Pt without  significant C spine tenderness and FROM without significant tenderness. C-spine cleared. Screening EKG without significant change. No leukocytosis. No electrolyte abnormalities. Mild anemia however troponin 0.81. Pt denies chest pain or shortness of breath. UA pending. Consult to hospitalist. Spoke with Imogene Burn PA-C who agrees to evaluate patient with plan for admission to telemetry bed.  Discussed all results and patient verbalizes understanding and agrees  with plan.  This is a shared patient. This patient was discussed with the physician who saw and evaluated the patient and agrees with the plan.   Al Corpus, PA-C 11/24/14 [place], MD 11/25/14 [phone]

## 2014-11-24 NOTE — ED Notes (Signed)
Pt had an unwitnessed fall this morning at her home. Pt lives in an assisted living home and has a life alert button after she fell. EMS found pt on bathroom floor. Pt has hematoma to back of head. Skin tear on left arm and bruise to right knee. Pt is alert and ox4. Denies any pain at this time.

## 2014-11-24 NOTE — ED Provider Notes (Signed)
Patient fell this morning, she struck her occiput as result fall. She reports that she felt generally weak as she had not eaten yesterday in preparation for dental  procedure, and only 8 slight amount this morning She presently is asymptomatic denies pain anywhere patient alert Glasgow Coma Score 15. Moves all extremities well no distress  Orlie Dakin, MD 11/24/14 1442

## 2014-11-25 ENCOUNTER — Observation Stay (HOSPITAL_COMMUNITY): Payer: Medicare Other

## 2014-11-25 DIAGNOSIS — W19XXXD Unspecified fall, subsequent encounter: Secondary | ICD-10-CM | POA: Diagnosis not present

## 2014-11-25 DIAGNOSIS — M25561 Pain in right knee: Secondary | ICD-10-CM

## 2014-11-25 DIAGNOSIS — R7989 Other specified abnormal findings of blood chemistry: Secondary | ICD-10-CM

## 2014-11-25 DIAGNOSIS — E039 Hypothyroidism, unspecified: Secondary | ICD-10-CM

## 2014-11-25 DIAGNOSIS — I10 Essential (primary) hypertension: Secondary | ICD-10-CM | POA: Diagnosis not present

## 2014-11-25 LAB — BASIC METABOLIC PANEL
Anion gap: 7 (ref 5–15)
BUN: 17 mg/dL (ref 6–20)
CO2: 27 mmol/L (ref 22–32)
Calcium: 8.6 mg/dL — ABNORMAL LOW (ref 8.9–10.3)
Chloride: 104 mmol/L (ref 101–111)
Creatinine, Ser: 0.96 mg/dL (ref 0.44–1.00)
GFR calc Af Amer: >60 mL/min (ref >60)
GFR calc non Af Amer: 54 mL/min — ABNORMAL LOW (ref >60)
Glucose, Bld: 107 mg/dL — ABNORMAL HIGH (ref 65–99)
POTASSIUM: 3.9 mmol/L (ref 3.5–5.1)
Sodium: 138 mmol/L (ref 135–145)

## 2014-11-25 LAB — URINE MICROSCOPIC-ADD ON

## 2014-11-25 LAB — TROPONIN I
Troponin I: 0.83 ng/mL (ref <0.031)
Troponin I: 0.66 ng/mL (ref <0.031)

## 2014-11-25 LAB — CK: Total CK: 1408 U/L — ABNORMAL HIGH (ref 38–234)

## 2014-11-25 LAB — URINALYSIS, ROUTINE W REFLEX MICROSCOPIC
Bilirubin Urine: NEGATIVE
Glucose, UA: NEGATIVE mg/dL
Ketones, ur: NEGATIVE mg/dL
Nitrite: NEGATIVE
Protein, ur: NEGATIVE mg/dL
SPECIFIC GRAVITY, URINE: 1.019 (ref 1.005–1.030)
UROBILINOGEN UA: 1 mg/dL (ref 0.0–1.0)
pH: 6 (ref 5.0–8.0)

## 2014-11-25 NOTE — Progress Notes (Addendum)
PROGRESS NOTE    Betty Chan YQM:[id number] DOB: 09-Feb-1930 DOA: 11/24/2014 PCP: [name], MD  HPI/Brief narrative 81 y/o female, ALF resident with PMH significant for aortic valve replacement (done at [facility], pig valve), hx of breast cancer, hypothyroidism, HTN and mild systolic heart failure with most recent 2-D echo demonstrating 45% EF; presented to ED with complaints of falling. She endorses having noting to eat and very little to drink 36 hours PTA after tooth extraction and after taking a shower on the day of admission, her legs felt weak and she fell hitting her head with the bathroom door. She pressed her life alert bottom and was transferred to ED for further evaluation. No LOC or CP. In ED she was found to have elevation of troponin up to 0.81 and no EKG changes.   Assessment/Plan:  Mechanical fall - May be related to weakness from poor oral intake - Mild bruising under her right knee with associated pain/no fractures on x-ray - CT head without acute findings except scalp hematoma. - PT recommends CIR (rehabilitation coordinator screened and felt not appropriate) versus SNF-social worker to consult - No orthostatic blood pressure changes  Elevated troponin - In the absence of chest pain or EKG changes. Creatinine normal. No hypotension. - Check CK. - Unclear significance. - Requested cardiology input. - Recent 2-D echo at [place]: EF 45% and grade 1 diastolic dysfunction  Hypothyroid - TSH 1.002 - Synthroid   Essential hypertension  - Controlled  - Continue lisinopril and metoprolol   Hyperlipidemia - Statins  GERD - PPI  Chronic combined systolic and diastolic CHF - Recent echo at [facility]: EF 45% and grade 1 diastolic dysfunction. - Trace ankle edema. Patient has declined Lasix due to urinary frequency - Outpatient follow-up with cardiology at [place] EKG. - Sinus rhythm on monitor without arrhythmias.    DVT prophylaxis: Heparin  subcutaneous  Code Status: Full Family Communication: None at bedside Disposition Plan: ? SNF pending Cardiology in put   Consultants:  Cardiology  Procedures:  None  Antibiotics:  None   Subjective: Denied complaints but on questioning about bruise underneath right knee did complain of mild pain on knee movement   Objective: Filed Vitals:   11/24/14 1645 11/24/14 1733 11/24/14 2001 11/25/14 0500  BP: 97/60 132/53 104/51 102/39  Pulse: 84 80 91 66  Temp:  98.7 F (37.1 C) 98 F (36.7 C) 98.1 F (36.7 C)  TempSrc:  Oral Oral Oral  Resp: 12 16    Height:  5\' 3"  (1.6 m)  5\' 3"  (1.6 m)  Weight:  84.868 kg (187 lb 1.6 oz)  84.596 kg (186 lb 8 oz)  SpO2: 99% 100% 99% 97%    Intake/Output Summary (Last 24 hours) at 11/25/14 1454 Last data filed at 11/25/14 1000  Gross per 24 hour  Intake    200 ml  Output    600 ml  Net   -400 ml   Filed Weights   11/24/14 1244 11/24/14 1733 11/25/14 0500  Weight: 83.462 kg (184 lb) 84.868 kg (187 lb 1.6 oz) 84.596 kg (186 lb 8 oz)     Exam:  General exam: Pleasant elderly female lying comfortably supine in bed Respiratory system: Clear. No increased work of breathing. Cardiovascular system: S1 & S2 heard, RRR. No JVD, murmurs, gallops, clicks. Trace ankle edema. Tele: SR Gastrointestinal system: Abdomen is nondistended, soft and nontender. Normal bowel sounds heard. Central nervous system: Alert and oriented. No focal neurological  deficits. Extremities: Symmetric 5 x 5 power. Small area of bruise under R knee and mild painful ROM.    Data Reviewed: Basic Metabolic Panel:  Recent Labs Lab 11/24/14 1456 11/24/14 1845 11/25/14 0517  NA 140  --  138  K 3.8  --  3.9  CL 102  --  104  CO2  --   --  27  GLUCOSE 106*  --  107*  BUN 25*  --  17  CREATININE 1.00  --  0.96  CALCIUM  --   --  8.6*  MG  --  2.0  --    Liver Function Tests: No results for input(s): AST, ALT, ALKPHOS, BILITOT, PROT, ALBUMIN in the last 168  hours. No results for input(s): LIPASE, AMYLASE in the last 168 hours. No results for input(s): AMMONIA in the last 168 hours. CBC:  Recent Labs Lab 11/24/14 1442 11/24/14 1456  WBC 9.2  --   HGB 11.8* 12.6  HCT 35.6* 37.0  MCV 96.7  --   PLT 199  --    Cardiac Enzymes:  Recent Labs Lab 11/24/14 1845 11/24/14 2327 11/25/14 0517  TROPONINI 0.81* 0.83* 0.66*   BNP (last 3 results) No results for input(s): PROBNP in the last 8760 hours. CBG: No results for input(s): GLUCAP in the last 168 hours.  No results found for this or any previous visit (from the past 240 hour(s)).    Studies: Ct Head Wo Contrast  11/24/2014   CLINICAL DATA:  Unwitnessed fall. Found on the floor. Hematoma to the back of the head. Initial encounter.  EXAM: CT HEAD WITHOUT CONTRAST  CT CERVICAL SPINE WITHOUT CONTRAST  TECHNIQUE: Multidetector CT imaging of the head and cervical spine was performed following the standard protocol without intravenous contrast. Multiplanar CT image reconstructions of the cervical spine were also generated.  COMPARISON:  None.  FINDINGS: CT HEAD FINDINGS  Soft tissue swelling is present near the vertex to the right of midline. There is no underlying fracture or gas within the soft tissues to suggest a significant laceration.  Moderate generalized atrophy and white matter disease is present. No acute cortical infarct or hemorrhage is present. There is no mass lesion. The ventricles are of normal size. Insert pass fluid  The paranasal sinuses and mastoid air cells are clear. The calvarium is intact.  CT CERVICAL SPINE FINDINGS  Cervical spine is imaged from the skullbase through T1-2. Grade 1 anterolisthesis is present at C6-7 and C7-T1. Vertebral body heights and alignment are maintained. No acute fracture or traumatic subluxation is evident.  Soft tissues of the neck demonstrate no focal mass lesion. The lung apices are clear.  IMPRESSION: 1. Scalp soft tissue swelling to the right of  midline near the vertex without an underlying fracture. 2. Mild generalized atrophy and white matter disease is within normal limits for age. 3. No acute intracranial abnormality.   Electronically Signed   By: San Morelle M.D.   On: 11/24/2014 14:30   Ct Cervical Spine Wo Contrast  11/24/2014   CLINICAL DATA:  Unwitnessed fall. Found on the floor. Hematoma to the back of the head. Initial encounter.  EXAM: CT HEAD WITHOUT CONTRAST  CT CERVICAL SPINE WITHOUT CONTRAST  TECHNIQUE: Multidetector CT imaging of the head and cervical spine was performed following the standard protocol without intravenous contrast. Multiplanar CT image reconstructions of the cervical spine were also generated.  COMPARISON:  None.  FINDINGS: CT HEAD FINDINGS  Soft tissue swelling is present near the  vertex to the right of midline. There is no underlying fracture or gas within the soft tissues to suggest a significant laceration.  Moderate generalized atrophy and white matter disease is present. No acute cortical infarct or hemorrhage is present. There is no mass lesion. The ventricles are of normal size. Insert pass fluid  The paranasal sinuses and mastoid air cells are clear. The calvarium is intact.  CT CERVICAL SPINE FINDINGS  Cervical spine is imaged from the skullbase through T1-2. Grade 1 anterolisthesis is present at C6-7 and C7-T1. Vertebral body heights and alignment are maintained. No acute fracture or traumatic subluxation is evident.  Soft tissues of the neck demonstrate no focal mass lesion. The lung apices are clear.  IMPRESSION: 1. Scalp soft tissue swelling to the right of midline near the vertex without an underlying fracture. 2. Mild generalized atrophy and white matter disease is within normal limits for age. 3. No acute intracranial abnormality.   Electronically Signed   By: San Morelle M.D.   On: 11/24/2014 14:30   Dg Knee Complete 4 Views Right  11/25/2014   CLINICAL DATA:  Fall after getting out  of the shower. Right knee pain and bruising.  EXAM: RIGHT KNEE - COMPLETE 4+ VIEW  COMPARISON:  None.  FINDINGS: Prepatellar soft tissue swelling is present without an underlying fracture. Moderate to severe degenerative changes are present within the patellofemoral compartment and lateral compartment of the knee. Chondrocalcinosis is present in the medial meniscus. A small joint effusion is present.  IMPRESSION: 1. Prepatellar soft tissue swelling without an underlying fracture. 2. A small joint effusion is present. 3. Moderate to severe degenerative changes within the lateral and patellofemoral compartments of the knee. 4. 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   Electronically Signed   By: San Morelle M.D.   On: 11/25/2014 09:57        Scheduled Meds: . aspirin EC  81 mg Oral Daily  . atorvastatin  20 mg Oral q1800  . heparin  5,000 Units Subcutaneous 3 times per day  . levothyroxine  50 mcg Oral QAC breakfast  . lisinopril  5 mg Oral Daily  . metoprolol tartrate  12.5 mg Oral BID  . oxybutynin  5 mg Oral BID  . pantoprazole  40 mg Oral Daily  . sodium chloride  3 mL Intravenous Q12H  . Tdap  0.5 mL Intramuscular Once   Continuous Infusions:   Principal Problem:   Elevated troponin Active Problems:   Hypothyroid   Dyslipidemia   GERD (gastroesophageal reflux disease)   OAB (overactive bladder)   History of breast cancer   BP (high blood pressure)   H/O aortic valve replacement   Fall   Scalp lesion   Heart failure, systolic and diastolic   Prolonged QT interval    Time spent: 30 minutes.    Vernell Leep, MD, FACP, FHM. [facility] Pager [phone]  If 7PM-7AM, please contact night-coverage [email or web address] Password [email or web address] 11/25/2014, 2:54 PM    LOS: 1 day

## 2014-11-25 NOTE — Consult Note (Signed)
Cardiology Consult Note  Admit date: 11/24/2014 Name: Betty Chan 81 y.o.  female DOB:  July 01, 1929 MRN:  [id number]  Today's date:  11/25/2014  Referring Physician:    [facility]  Primary Physician:    Dr. Ripley Fraise  Reason for Consultation:    Abnormal troponin   IMPRESSIONS: 1.  Nonspecific elevation of troponin not suggestive of myocardial ischemia 2.  Recent fall which is mechanical 3.  Prior history of TAVR for severe aortic stenosis with good improvement in symptoms 4.  Mild systolic dysfunction but improved over previous echo 5.  Severe osteoarthritis  RECOMMENDATION: The troponin elevation is completely nonspecific.  There is no clinical evidence of ischemia.  She does have significant hypertrophy and this could have been a nonspecific of elevation secondary to mechanical fall.  No additional cardiovascular workup is necessary.  One year  ago she had catheterization that did not show significant coronary artery disease so I think no further workup is necessary at this time.  HISTORY: This very nice 81 year old female lives in a retirement facility.  She is able to be ambulatory with aid of a walker.  She has severe aortic stenosis and in 2013 had cardiac catheterization that showed a previous severe aortic stenosis but only mild coronary irregularities.  She underwent TAVR about one year ago with a 29 mm AP and prosthesis.  Since that point in time she had marked improvement in her dyspnea.  She evidently had some dental work done a couple of days ago and had not eaten for several meals and was somewhat weak.  While trying to fix a meal she became weak and unsteady on her feet and fell striking her head but did not have syncope.  She was brought to the emergency room here and troponins were drawn for some reason that showed elevation that was minimal and have gradually declined better not completely normal now.  There were no acute EKG changes.  She had absolutely no  chest pain or shortness of breath and did not have what would be considered cardiac syncope.  Her dyspnea has significantly improved since she had her heart valve replacement.  Echocardiogram done in March at [facility] showed no evidence of aortic regurgitation, and ejection fraction of 45% and good prosthetic valve function.  Past Medical History  Diagnosis Date  . Aortic stenosis     s/p TAVR 08/2013 -follows at [facility])  . TIA (transient ischemic attack) 2007    "not sure if it was a TIA or seizure"  . Hypothyroid   . Dyslipidemia   . PHN (postherpetic neuralgia)     L anterior thigh 2007, recurrent 04/2013  . GERD (gastroesophageal reflux disease)   . Osteopenia 02/13/2014    DEXA 01/2014: -2.2  . Hypertension   . Cancer of right breast 2004    lumpectomy  . Basal cell carcinoma   . Squamous cell cancer of skin of forearm   . Heart murmur   . Seizure 2007    single event, off Keppra since 11/2013; "not sure if it was a TIA or seizure"  . OA (osteoarthritis) of knee     "right" (11/24/2014)      Past Surgical History  Procedure Laterality Date  . Transcatheter aortic valve replacement, transfemoral  09/06/13    [facility]  . Tonsillectomy    . Breast biopsy Right 2004  . Breast lumpectomy Right 08/2002 X 2  . Cardiac valve replacement    . Cardiac catheterization  "several"  .  Cataract extraction w/ intraocular lens  implant, bilateral Bilateral 03/2014-07/2014  . Basal cell carcinoma excision      sternum  . Squamous cell carcinoma excision Left 06/2014    'forearm"     Allergies:  is allergic to amoxicillin and penicillins.   Medications: Prior to Admission medications   Medication Sig Start Date End Date Taking? Authorizing Provider  acetaminophen (TYLENOL) 500 MG tablet Take 1 tablet (500 mg total) by mouth every 6 (six) hours as needed for fever. Patient taking differently: Take 500 mg by mouth every 8 (eight) hours.  06/26/14  Yes Rowe Clack, MD  aspirin 81 MG  tablet Take 81 mg by mouth daily.   Yes Historical Provider, MD  atorvastatin (LIPITOR) 20 MG tablet TAKE 1 TABLET BY MOUTH EVERY DAY AT 6 PM 07/25/14  Yes Rowe Clack, MD  Biotin 5000 MCG CAPS Take 5,000 mcg by mouth daily.   Yes Historical Provider, MD  cholecalciferol (VITAMIN D) 1000 UNITS tablet Take 1,000 Units by mouth 2 (two) times daily.    Yes Historical Provider, MD  clindamycin (CLEOCIN) 150 MG capsule Take 600 mg by mouth as needed (1 hour prior to dental appointment).  11/07/14  Yes Historical Provider, MD  levothyroxine (SYNTHROID, LEVOTHROID) 50 MCG tablet Take 1 tablet (50 mcg total) by mouth daily before breakfast. 03/28/14  Yes Rowe Clack, MD  lisinopril (PRINIVIL,ZESTRIL) 5 MG tablet TAKE 1 TABLET BY MOUTH EVERY DAY 08/18/14  Yes Rowe Clack, MD  metoprolol tartrate (LOPRESSOR) 25 MG tablet Take 0.5 tablets (12.5 mg total) by mouth 2 (two) times daily. 06/26/14  Yes Rowe Clack, MD  Multiple Vitamins-Minerals (CENTRUM FLAVOR BURST ADULT) CHEW Chew 2 tablets by mouth daily.   Yes Historical Provider, MD  omeprazole (PRILOSEC) 20 MG capsule Take 1 capsule (20 mg total) by mouth daily. 03/28/14  Yes Rowe Clack, MD  OVER THE COUNTER MEDICATION Apply 1 application topically daily. Pennsaid   Yes Historical Provider, MD  oxybutynin (DITROPAN) 5 MG tablet Take 5 mg by mouth 2 (two) times daily.   Yes Historical Provider, MD  TURMERIC PO Take 1 tablet by mouth daily.   Yes Historical Provider, MD    Family History: No family status information on file.    Social History:   reports that she has never smoked. She has never used smokeless tobacco. She reports that she drinks alcohol. She reports that she does not use illicit drugs.   History   Social History Narrative    Review of Systems: She has had severe osteoarthritis of her knees and has been receiving injections as well as holistic care for it.  She has very mild shortness of breath but has  been feeling much better in terms of her dyspnea.  She denies PND or orthopnea.  She has chronic venous insufficiency and has mild peripheral edema.  She has occasional GERD.  She has mild stress incontinence and has an overactive bladder and was recently started on a different medicine for her bladder.  She has had frequent falls previously.  Other than as noted above the remainder of the review of systems is unremarkable. Physical Exam: BP 117/53 mmHg  Pulse 72  Temp(Src) 98.5 F (36.9 C) (Oral)  Resp 16  Ht 5\' 3"  (1.6 m)  Wt 84.596 kg (186 lb 8 oz)  BMI 33.05 kg/m2  SpO2 98%  General appearance: Pleasant elderly female in no acute distress Head: Normocephalic, without obvious abnormality, atraumatic Eyes:  conjunctivae/corneas clear. PERRL, EOM's intact. Fundi not examined Neck: no adenopathy, no carotid bruit, no JVD and supple, symmetrical, trachea midline Lungs: clear to auscultation bilaterally Heart: Regular rate and rhythm, normal S1 and S2, no S3, 1/6 systolic murmur over aortic valve, no diastolic murmur Abdomen: soft, non-tender; bowel sounds normal; no masses,  no organomegaly Pelvic: deferred Extremities: Extremities atraumatic, 1+ edema, changes of chronic venous insufficiency noted, mild crepitus of the knees Pulses: 2+ and symmetric Skin: Skin color, texture, turgor normal. No rashes or lesions Neurologic: Grossly normal   Labs: CBC  Recent Labs  11/24/14 1442 11/24/14 1456  WBC 9.2  --   RBC 3.68*  --   HGB 11.8* 12.6  HCT 35.6* 37.0  PLT 199  --   MCV 96.7  --   MCH 32.1  --   MCHC 33.1  --   RDW 12.7  --    CMP   Recent Labs  11/25/14 0517  NA 138  K 3.9  CL 104  CO2 27  GLUCOSE 107*  BUN 17  CREATININE 0.96  CALCIUM 8.6*  GFRNONAA 54*  GFRAA >60   BNP (last 3 results)    Component Value Date/Time   BNP 268.9* 11/24/2014 1845   Cardiac Panel (last 3 results)  Recent Labs  11/24/14 1845 11/24/14 2327 11/25/14 0517  TROPONINI  0.81* 0.83* 0.66*   EKG: Normal sinus rhythm, no acute abnormality  Signed:  W. Doristine Church MD [facility]   Cardiology Consultant  11/25/2014, 3:50 PM

## 2014-11-25 NOTE — Progress Notes (Signed)
Physical Therapy Evaluation Patient Details Name: Anzlee Hinesley MRN: [id number] DOB: 1930-06-07 Today's Date: 11/25/2014   History of Present Illness  81 yo female post fall at home, hitting back of head.  Patient had not eaten much in day previous due to tooth extraction, may be contributing factor.  Clinical Impression  Patient reports she feels weak, has not been out of bed at all yet.  She lives in ALF, but currently requiring MOD assist overall for all mobility, not appropriate for ALF at this time, but may benefit from SNF or CIR prior to returning home.  Overall, patient with posterior lean tendency and weakness, able to maneuver in room with assistance, not community level mobility however.  Patient is good rehab candidate overall, and will benefit from continuation of skilled PT services.    Follow Up Recommendations SNF;Supervision/Assistance - 24 hour;CIR    Equipment Recommendations  Rolling walker with 5" wheels    Recommendations for Other Services Rehab consult     Precautions / Restrictions Precautions Precautions: Fall Restrictions Weight Bearing Restrictions: No      Mobility  Bed Mobility Overal bed mobility: Needs Assistance Bed Mobility: Rolling;Supine to Sit Rolling: Min assist   Supine to sit: Mod assist     General bed mobility comments: use of rails, cues for technique  Transfers Overall transfer level: Needs assistance Equipment used: Rolling walker (2 wheeled) Transfers: Sit to/from [facility] Sit to Stand: Mod assist Stand pivot transfers: Min assist       General transfer comment: Posterior lean generally, worse on initial stand, difficulty with turning and stepping backwards, L foot needs steppage gait, appears due to fallen arch vs foot drop.  Ambulation/Gait Ambulation/Gait assistance: Min assist Ambulation Distance (Feet): 15 Feet Assistive device: Rolling walker (2 wheeled) Gait Pattern/deviations: Decreased  stride length;Leaning posteriorly;Shuffle;Wide base of support   Gait velocity interpretation: Below normal speed for age/gender General Gait Details: L foot steppage gait, appears due to fallen arch vs foot drop  Stairs            Wheelchair Mobility    Modified Rankin (Stroke Patients Only)       Balance Overall balance assessment: Needs assistance   Sitting balance-Leahy Scale: Fair     Standing balance support: Bilateral upper extremity supported Standing balance-Leahy Scale: Poor                               Pertinent Vitals/Pain Pain Assessment: No/denies pain    Home Living Family/patient expects to be discharged to:: Skilled nursing facility                 Additional Comments: Lives in ALF, needs assist at this time.    Prior Function Level of Independence: Independent with assistive device(s)         Comments: uses RW for mobility     Hand Dominance        Extremity/Trunk Assessment   Upper Extremity Assessment: Overall WFL for tasks assessed           Lower Extremity Assessment: Generalized weakness      Cervical / Trunk Assessment: Normal  Communication   Communication: No difficulties  Cognition Arousal/Alertness: Awake/alert Behavior During Therapy: WFL for tasks assessed/performed Overall Cognitive Status: Within Functional Limits for tasks assessed                      General Comments General comments (  skin integrity, edema, etc.): Patient reports she has a shoe insert that helps her walk better (L foot)    Exercises        Assessment/Plan    PT Assessment Patient needs continued PT services  PT Diagnosis Difficulty walking;Generalized weakness   PT Problem List Decreased strength;Decreased balance;Decreased mobility  PT Treatment Interventions Gait training;Functional mobility training;Therapeutic activities;Therapeutic exercise;Balance training   PT Goals (Current goals can be found  in the Care Plan section) Acute Rehab PT Goals Patient Stated Goal: Get stronger PT Goal Formulation: With patient Time For Goal Achievement: 12/09/14 Potential to Achieve Goals: Good    Frequency Min 3X/week   Barriers to discharge Decreased caregiver support Lives in ALF, needs constant assist at this time.    Co-evaluation               End of Session Equipment Utilized During Treatment: Gait belt Activity Tolerance: Patient tolerated treatment well Patient left: in chair;with call bell/phone within reach Nurse Communication: Mobility status    Functional Assessment Tool Used: Clinical judgement Functional Limitation: Mobility: Walking and moving around Mobility: Walking and Moving Around Current Status (V7915): At least 60 percent but less than 80 percent impaired, limited or restricted Mobility: Walking and Moving Around Goal Status [phone]): At least 1 percent but less than 20 percent impaired, limited or restricted    Time: 1120-1205 PT Time Calculation (min) (ACUTE ONLY): 45 min   Charges:   PT Evaluation $Initial PT Evaluation Tier I: 1 Procedure PT Treatments $Gait Training: 8-22 mins $Therapeutic Activity: 8-22 mins   PT G Codes:   PT G-Codes **NOT FOR INPATIENT CLASS** Functional Assessment Tool Used: Clinical judgement Functional Limitation: Mobility: Walking and moving around Mobility: Walking and Moving Around Current Status (C3837): At least 60 percent but less than 80 percent impaired, limited or restricted Mobility: Walking and Moving Around Goal Status [phone]): At least 1 percent but less than 20 percent impaired, limited or restricted    Zenia Resides, Kadajah Kjos L 11/25/2014, 12:37 PM

## 2014-11-25 NOTE — Progress Notes (Signed)
Rehab Admissions Coordinator Note:  Patient was screened by Cleatrice Burke for appropriateness for an Inpatient Acute Rehab Consult per PT recommendation. At this time, we are recommending [facility] if unable to return to ALF. Pt in observation status and P.T. Has recommended SNF as well as 24 hr care and CIR. [employer] will approve least intensive rehab when all venues recommended.  Cleatrice Burke 11/25/2014, 2:28 PM  I can be reached at [phone].

## 2014-11-26 DIAGNOSIS — I10 Essential (primary) hypertension: Secondary | ICD-10-CM | POA: Diagnosis not present

## 2014-11-26 DIAGNOSIS — W19XXXD Unspecified fall, subsequent encounter: Secondary | ICD-10-CM | POA: Diagnosis not present

## 2014-11-26 DIAGNOSIS — R7989 Other specified abnormal findings of blood chemistry: Secondary | ICD-10-CM | POA: Diagnosis not present

## 2014-11-26 LAB — CBC
HCT: 35.5 % — ABNORMAL LOW (ref 36.0–46.0)
HEMOGLOBIN: 11.3 g/dL — AB (ref 12.0–15.0)
MCH: 31.2 pg (ref 26.0–34.0)
MCHC: 31.8 g/dL (ref 30.0–36.0)
MCV: 98.1 fL (ref 78.0–100.0)
PLATELETS: 188 10*3/uL (ref 150–400)
RBC: 3.62 MIL/uL — AB (ref 3.87–5.11)
RDW: 12.6 % (ref 11.5–15.5)
WBC: 5.7 10*3/uL (ref 4.0–10.5)

## 2014-11-26 LAB — CK: CK TOTAL: 723 U/L — AB (ref 38–234)

## 2014-11-26 NOTE — Clinical Social Work Note (Signed)
Clinical Social Work Assessment  Patient Details  Name: Betty Chan MRN: [id number] Date of Birth: 1929/08/01  Date of referral:  11/26/14               Reason for consult:  Facility Placement                Permission sought to share information with:  Facility Sport and exercise psychologist, Family Supports Permission granted to share information::     Name::     [place] [facility], Daughter Sanjuana Letters  [phone]  Agency::     Relationship::     Contact Information:     Housing/Transportation Living arrangements for the past 2 months:  [place] of Information:  Patient, Adult Children Patient Interpreter Needed:  None Criminal Activity/Legal Involvement Pertinent to Current Situation/Hospitalization:  No - Comment as needed Significant Relationships:  Adult Children Lives with:  Self Do you feel safe going back to the place where you live?  Yes Need for family participation in patient care:  Yes (Comment)  Care giving concerns:  Patient resides at [facility] in [place]. She states that she was completely independent prior to this hospitalization and prides herself on this.    Social Worker assessment / plan:  81 year old female, alert, oriented and extremely pleasant lady who is requesting short term SNF. Physical Therapy has evaluated patient and recommend short term SNF.  CSW discussed with patient that her insurance [facility]- requires authorization for SNF level of care but the insurance company is closed today.)  Patient will require a Letter of Guarantee.  CSW notified Dr. Reynaldo Minium- and discussed this issue. Patient's care needs reviewed and he approved referral to [facility]. This is patient's facility of choice. CSW spoke to [place] at [place] and referral completed. Discussed need for Letter of Guarantee.   Fl2 completed and signed by MD. Madaline Brilliant per MD for dc to SNF today.  Employment status:  Retired Programmer, applications PT Recommendations:  [place] / Referral to community resources:  [place]  Patient/Family's Response to care:  "I need to get stronger. I fell and hit my head."  Patient is requesting short term SNF.    Patient/Family's Understanding of and Emotional Response to Diagnosis, Current Treatment, and Prognosis:  Patient and daughter both agree that patient is weak and in need of short term SNF. Patient is concerned about recent fall.  She is anxious to return to her independent living apartment as soon as she is strong enough but has an extremely positive attitude re: need for rehab. Patient was extremely please to receive a bed offer from [facility].  Emotional Assessment Appearance:  Appears stated age Attitude/Demeanor/Rapport:   (Patient very in atittude, smiling and cooperative with CSW. "Wants [facility]") Affect (typically observed):  Calm, Happy, Appropriate, Pleasant Orientation:  Oriented to Self, Oriented to Place, Oriented to  Time, Oriented to Situation Alcohol / Substance use:  Alcohol Use Psych involvement (Current and /or in the community):  No (Comment)  Discharge Needs  Concerns to be addressed:  Care Coordination Readmission within the last 30 days:  No Current discharge risk:  None Barriers to Discharge:  No Barriers Identified   Williemae Area, LCSW 11/26/2014, 9:21 PM

## 2014-11-26 NOTE — Clinical Social Work Placement (Addendum)
   CLINICAL SOCIAL WORK PLACEMENT  NOTE  Date:  11/26/2014  Patient Details  Name: Betty Chan MRN: [id number] Date of Birth: 01-Nov-1929  Clinical Social Work is seeking post-discharge placement for this patient at the [place] level of care (*CSW will initial, date and re-position this form in  chart as items are completed):  Yes   Patient/family provided with [place] Work Department's list of facilities offering this level of care within the geographic area requested by the patient (or if unable, by the patient's family).  Yes   Patient/family informed of their freedom to choose among providers that offer the needed level of care, that participate in Medicare, Medicaid or managed care program needed by the patient, have an available bed and are willing to accept the patient.  Yes   Patient/family informed of [facility]'s ownership interest in [facility] and [facility], as well as of the fact that they are under no obligation to receive care at these facilities.  PASRR submitted to EDS on 11/26/14     PASRR number received on 11/26/14     Existing PASRR number confirmed on       FL2 transmitted to all facilities in geographic area requested by pt/family on 11/26/14     FL2 transmitted to all facilities within larger geographic area on       Patient informed that his/her managed care company has contracts with or will negotiate with certain facilities, including the following:   ([employer]- Unable to obtain authorization (Sunday))     Yes   Patient/family informed of bed offers received.  Patient chooses bed at [facility]     Physician recommends and patient chooses bed at      Patient to be transferred to [facility] on 11/26/14.  Patient to be transferred to facility by Ambulance [facility]y [facility])     Patient family notified on 11/26/14 of transfer.  Name of family member notified:  [name]- Daughter     PHYSICIAN Please prepare priority discharge summary, including medications, Please sign FL2, Please prepare prescriptions     Additional Comment:  Ok per MD for d/c today to SNF. Patient is medically stable for d/c. Unable to obtain [facility] authorization. Discussed with Dr. Marnee Spring- Medical Director who requested contact with [facility] for Letter of Guarantee.  This was facility of choice for patient.  Bed offered (private room) and patient/daughter were extremely pleased. Nursing notified to call report.  No further CSW needs identified. CSW signing off.  Lorie Phenix. Murrell Redden [phone]       _______________________________________________ Kendell Bane T, LCSW 11/26/2014, 1:00 pm

## 2014-11-26 NOTE — Discharge Summary (Addendum)
Physician Discharge Summary  Betty Chan YDX:[id number] DOB: 09-23-1929 DOA: 11/24/2014  PCP: [name], MD  Admit date: 11/24/2014 Discharge date: 11/26/2014  Time spent: Less than 30 minutes  Recommendations for Outpatient Follow-up:  1. M.D. at SNF in 2 days with repeat labs (CBC, BMP and CK) and EKG to follow-up on QTC 2. Encourage ad lib. oral fluid intake 3. Dr. [name], PCP upon discharge from SNF. 4. Dr. [name], Cardiology at [facility]: SNF to arrange for follow-up.  Discharge Diagnoses:  Principal Problem:   Elevated troponin Active Problems:   Hypothyroid   Dyslipidemia   GERD (gastroesophageal reflux disease)   OAB (overactive bladder)   History of breast cancer   Essential hypertension   H/O aortic valve replacement TAVR   Fall   Discharge Condition: Improved & Stable  Diet recommendation: Heart healthy diet.  Filed Weights   11/24/14 1733 11/25/14 0500 11/26/14 0500  Weight: 84.868 kg (187 lb 1.6 oz) 84.596 kg (186 lb 8 oz) 83.825 kg (184 lb 12.8 oz)    History of present illness:  81 y/o female, ALF resident with PMH significant for aortic valve replacement (done at [facility], pig valve), hx of breast cancer, hypothyroidism, HTN and mild systolic heart failure with most recent 2-D echo demonstrating 45% EF; presented to ED with complaints of falling. She endorses having noting to eat and very little to drink 36 hours PTA after tooth extraction and after taking a shower on the day of admission, her legs felt weak and she fell hitting her head with the bathroom door. She pressed her life alert bottom and was transferred to ED for further evaluation. No LOC or CP. In ED she was found to have elevation of troponin up to 0.81 and no EKG changes.  Hospital Course:   Mechanical fall - May be related to weakness from poor oral intake - Mild bruising under her right knee with associated mild pain/no fractures on x-ray - CT head without acute findings  except scalp soft tissue swelling right side of vertex but without open cuts or wounds. - PT recommends CIR (rehabilitation coordinator screened and felt not appropriate) versus SNF >patient has a bed today - No orthostatic blood pressure changes  Elevated troponin - In the absence of chest pain or EKG changes. Creatinine normal. No hypotension. - Check CK elevated: 1408 >723. - Recent 2-D echo at [facility]: EF 45% and grade 1 diastolic dysfunction - Cardiology consultation appreciated: Nonspecific elevation of troponin not suggestive of myocardial ischemia and no further cardiovascular workup recommended. Cardiac catheterization 1 year ago did not show significant CAD.  Mild rhabdomyolysis - Secondary to fall. Improving and asymptomatic. Encouraged ad lib. oral fluid intake and follow CK as outpatient.  Hypothyroid - TSH 1.002 - Synthroid   Essential hypertension  - Controlled  - Continue lisinopril and metoprolol   Hyperlipidemia - Statins: Would continue because reason for her mild rhabdo was trauma and not statins and CK is continuing to decrease.  GERD - PPI  Chronic combined systolic and diastolic CHF - Recent echo at [facility]: EF 45% and grade 1 diastolic dysfunction. - Trace ankle edema. Patient has declined Lasix due to urinary frequency - Outpatient follow-up with cardiology at [facility]  Prolonged QTC - QTC on 6/3:482. Periodically follow EKG as outpatient. - Sinus rhythm on monitor without arrhythmias.  Anemia - Stable. Follow CBC outpatient   Prior history of TAVR for severe aortic stenosis with good improvement in symptoms   Consultants:  Cardiology  Procedures:  None   Discharge Exam:  Complaints: Denies complaints. States that right knee area pain has improved. No chest pain, palpitations, dyspnea, dizziness or lightheadedness.  Filed Vitals:   11/25/14 2100 11/26/14 0500 11/26/14 0945 11/26/14 0949  BP: 113/37 139/62 114/53   Pulse: 67 62  65  Temp: 98  F (36.7 C) 98.4 F (36.9 C)    TempSrc:      Resp: 13 12    Height:      Weight:  83.825 kg (184 lb 12.8 oz)    SpO2: 98% 98%      General exam: Pleasant elderly female lying comfortably supine in bed Respiratory system: Clear. No increased work of breathing. Cardiovascular system: S1 & S2 heard, RRR. No JVD, murmurs, gallops, clicks. Trace ankle edema. Tele: SR Gastrointestinal system: Abdomen is nondistended, soft and nontender. Normal bowel sounds heard. Central nervous system: Alert and oriented. No focal neurological deficits. Extremities: Symmetric 5 x 5 power. Small area of bruise under R knee and mild painful ROM- improved.    Discharge Instructions      Discharge Instructions    Call MD for:  extreme fatigue    Complete by:  As directed      Call MD for:  persistant dizziness or light-headedness    Complete by:  As directed      Call MD for:  severe uncontrolled pain    Complete by:  As directed      Diet - low sodium heart healthy    Complete by:  As directed      Increase activity slowly    Complete by:  As directed             Medication List    TAKE these medications        acetaminophen 500 MG tablet  Commonly known as:  TYLENOL  Take 1 tablet (500 mg total) by mouth every 6 (six) hours as needed for fever.     aspirin 81 MG tablet  Take 81 mg by mouth daily.     atorvastatin 20 MG tablet  Commonly known as:  LIPITOR  TAKE 1 TABLET BY MOUTH EVERY DAY AT 6 PM     Biotin 5000 MCG Caps  Take 5,000 mcg by mouth daily.     CENTRUM FLAVOR BURST ADULT Chew  Chew 2 tablets by mouth daily.     cholecalciferol 1000 UNITS tablet  Commonly known as:  VITAMIN D  Take 1,000 Units by mouth 2 (two) times daily.     clindamycin 150 MG capsule  Commonly known as:  CLEOCIN  Take 600 mg by mouth as needed (1 hour prior to dental appointment).     levothyroxine 50 MCG tablet  Commonly known as:  SYNTHROID, LEVOTHROID  Take 1 tablet (50 mcg total) by mouth  daily before breakfast.     lisinopril 5 MG tablet  Commonly known as:  PRINIVIL,ZESTRIL  TAKE 1 TABLET BY MOUTH EVERY DAY     metoprolol tartrate 25 MG tablet  Commonly known as:  LOPRESSOR  Take 0.5 tablets (12.5 mg total) by mouth 2 (two) times daily.     omeprazole 20 MG capsule  Commonly known as:  PRILOSEC  Take 1 capsule (20 mg total) by mouth daily.     OVER THE COUNTER MEDICATION  Apply 1 application topically daily. Pennsaid     oxybutynin 5 MG tablet  Commonly known as:  DITROPAN  Take 5 mg by mouth 2 (two)  times daily.     TURMERIC PO  Take 1 tablet by mouth daily.          The results of significant diagnostics from this hospitalization (including imaging, microbiology, ancillary and laboratory) are listed below for reference.    Significant Diagnostic Studies: Ct Head Wo Contrast  11/24/2014   CLINICAL DATA:  Unwitnessed fall. Found on the floor. Hematoma to the back of the head. Initial encounter.  EXAM: CT HEAD WITHOUT CONTRAST  CT CERVICAL SPINE WITHOUT CONTRAST  TECHNIQUE: Multidetector CT imaging of the head and cervical spine was performed following the standard protocol without intravenous contrast. Multiplanar CT image reconstructions of the cervical spine were also generated.  COMPARISON:  None.  FINDINGS: CT HEAD FINDINGS  Soft tissue swelling is present near the vertex to the right of midline. There is no underlying fracture or gas within the soft tissues to suggest a significant laceration.  Moderate generalized atrophy and white matter disease is present. No acute cortical infarct or hemorrhage is present. There is no mass lesion. The ventricles are of normal size. Insert pass fluid  The paranasal sinuses and mastoid air cells are clear. The calvarium is intact.  CT CERVICAL SPINE FINDINGS  Cervical spine is imaged from the skullbase through T1-2. Grade 1 anterolisthesis is present at C6-7 and C7-T1. Vertebral body heights and alignment are maintained. No  acute fracture or traumatic subluxation is evident.  Soft tissues of the neck demonstrate no focal mass lesion. The lung apices are clear.  IMPRESSION: 1. Scalp soft tissue swelling to the right of midline near the vertex without an underlying fracture. 2. Mild generalized atrophy and white matter disease is within normal limits for age. 3. No acute intracranial abnormality.   Electronically Signed   By: San Morelle M.D.   On: 11/24/2014 14:30   Ct Cervical Spine Wo Contrast  11/24/2014   CLINICAL DATA:  Unwitnessed fall. Found on the floor. Hematoma to the back of the head. Initial encounter.  EXAM: CT HEAD WITHOUT CONTRAST  CT CERVICAL SPINE WITHOUT CONTRAST  TECHNIQUE: Multidetector CT imaging of the head and cervical spine was performed following the standard protocol without intravenous contrast. Multiplanar CT image reconstructions of the cervical spine were also generated.  COMPARISON:  None.  FINDINGS: CT HEAD FINDINGS  Soft tissue swelling is present near the vertex to the right of midline. There is no underlying fracture or gas within the soft tissues to suggest a significant laceration.  Moderate generalized atrophy and white matter disease is present. No acute cortical infarct or hemorrhage is present. There is no mass lesion. The ventricles are of normal size. Insert pass fluid  The paranasal sinuses and mastoid air cells are clear. The calvarium is intact.  CT CERVICAL SPINE FINDINGS  Cervical spine is imaged from the skullbase through T1-2. Grade 1 anterolisthesis is present at C6-7 and C7-T1. Vertebral body heights and alignment are maintained. No acute fracture or traumatic subluxation is evident.  Soft tissues of the neck demonstrate no focal mass lesion. The lung apices are clear.  IMPRESSION: 1. Scalp soft tissue swelling to the right of midline near the vertex without an underlying fracture. 2. Mild generalized atrophy and white matter disease is within normal limits for age. 3. No  acute intracranial abnormality.   Electronically Signed   By: San Morelle M.D.   On: 11/24/2014 14:30   Dg Knee Complete 4 Views Right  11/25/2014   CLINICAL DATA:  Fall after getting out of  the shower. Right knee pain and bruising.  EXAM: RIGHT KNEE - COMPLETE 4+ VIEW  COMPARISON:  None.  FINDINGS: Prepatellar soft tissue swelling is present without an underlying fracture. Moderate to severe degenerative changes are present within the patellofemoral compartment and lateral compartment of the knee. Chondrocalcinosis is present in the medial meniscus. A small joint effusion is present.  IMPRESSION: 1. Prepatellar soft tissue swelling without an underlying fracture. 2. A small joint effusion is present. 3. Moderate to severe degenerative changes within the lateral and patellofemoral compartments of the knee. 4. 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   Electronically Signed   By: San Morelle M.D.   On: 11/25/2014 09:57    Microbiology: No results found for this or any previous visit (from the past 240 hour(s)).   Labs: Basic Metabolic Panel:  Recent Labs Lab 11/24/14 1456 11/24/14 1845 11/25/14 0517  NA 140  --  138  K 3.8  --  3.9  CL 102  --  104  CO2  --   --  27  GLUCOSE 106*  --  107*  BUN 25*  --  17  CREATININE 1.00  --  0.96  CALCIUM  --   --  8.6*  MG  --  2.0  --    Liver Function Tests: No results for input(s): AST, ALT, ALKPHOS, BILITOT, PROT, ALBUMIN in the last 168 hours. No results for input(s): LIPASE, AMYLASE in the last 168 hours. No results for input(s): AMMONIA in the last 168 hours. CBC:  Recent Labs Lab 11/24/14 1442 11/24/14 1456 11/26/14 0301  WBC 9.2  --  5.7  HGB 11.8* 12.6 11.3*  HCT 35.6* 37.0 35.5*  MCV 96.7  --  98.1  PLT 199  --  188   Cardiac Enzymes:  Recent Labs Lab 11/24/14 1845 11/24/14 2327 11/25/14 0517 11/25/14 1600 11/26/14 0918  CKTOTAL  --   --   --  1408* 723*  TROPONINI 0.81* 0.83*  0.66*  --   --    BNP: BNP (last 3 results)  Recent Labs  11/24/14 1845  BNP 268.9*    ProBNP (last 3 results) No results for input(s): PROBNP in the last 8760 hours.  CBG: No results for input(s): GLUCAP in the last 168 hours.  Left message for daughter Ms. [name].  Signed:  Vernell Leep, MD, FACP, FHM. [facility] Pager [phone]  If 7PM-7AM, please contact night-coverage [email or web address] Password [email or web address] 11/26/2014, 12:18 PM

## 2014-11-26 NOTE — Evaluation (Signed)
Occupational Therapy Evaluation and Discharge Patient Details Name: Betty Chan MRN: [id number] DOB: [birth year] Today's Date: 11/26/2014    History of Present Illness 81 yo female post fall at home, hitting back of head.  Patient had not eaten much in day previous due to tooth extraction, may be contributing factor.   Clinical Impression   This 81 yo female admitted with above presents to acute OT with decreased balance and decreased mobility both affecting her ability to care for herself as she was pta at ALF. She will benefit from continued OT at SNF, we will D/C from acute due to pt is to D/C to SNF today.    Follow Up Recommendations  SNF    Equipment Recommendations  None recommended by OT       Precautions / Restrictions Precautions Precautions: Fall Restrictions Weight Bearing Restrictions: No      Mobility Bed Mobility Overal bed mobility: Needs Assistance Bed Mobility: Rolling;Sidelying to Sit Rolling: Min assist Sidelying to sit: Mod assist          Transfers Overall transfer level: Needs assistance Equipment used: Rolling walker (2 wheeled) Transfers: Sit to/from [facility] Sit to Stand: Max assist Stand pivot transfers: Mod assist            Balance Overall balance assessment: Needs assistance Sitting-balance support: No upper extremity supported;Feet supported Sitting balance-Leahy Scale: Fair     Standing balance support: Bilateral upper extremity supported Standing balance-Leahy Scale: Zero Standing balance comment: posterior lean                            ADL Overall ADL's : Needs assistance/impaired Eating/Feeding: Independent;Sitting   Grooming: Set up;Sitting   Upper Body Bathing: Set up;Sitting   Lower Body Bathing: Moderate assistance (with Max A sit<>stand)   Upper Body Dressing : Minimal assistance;Sitting   Lower Body Dressing: Total assistance (with Max A sit<>stand)     Toilet  Transfer Details (indicate cue type and reason): max A sit<>stand, Mod A to stand pivot Toileting- Clothing Manipulation and Hygiene: Total assistance (with Max A sit<>stand)               Vision Additional Comments: No change from baseline          Pertinent Vitals/Pain Pain Assessment: No/denies pain     Hand Dominance  right   Extremity/Trunk Assessment Upper Extremity Assessment Upper Extremity Assessment: Overall WFL for tasks assessed           Communication Communication Communication: No difficulties   Cognition Arousal/Alertness: Awake/alert Behavior During Therapy: WFL for tasks assessed/performed Overall Cognitive Status: Within Functional Limits for tasks assessed                                Home Living Family/patient expects to be discharged to:: Skilled nursing facility                                 Additional Comments: Lives in ALF, needs assist at this time.      Prior Functioning/Environment Level of Independence: Independent with assistive device(s)        Comments: uses RW for mobility    OT Diagnosis: Generalized weakness   OT Problem List: Decreased strength;Impaired balance (sitting and/or standing);Obesity;Decreased knowledge of use of DME or AE  OT Goals(Current goals can be found in the care plan section) Acute Rehab OT Goals Patient Stated Goal: go to rehab today then eventually home  OT Frequency:                End of Session Equipment Utilized During Treatment: Gait belt;Rolling walker Nurse Communication: Mobility status (NT)  Activity Tolerance: Patient tolerated treatment well Patient left: in chair;with call bell/phone within reach   Time: 1145-1201 OT Time Calculation (min): 16 min Charges:  OT General Charges $OT Visit: 1 Procedure OT Evaluation $Initial OT Evaluation Tier I: 1 Procedure  Almon Register [phone] 11/26/2014, 12:32 PM

## 2014-11-26 NOTE — Discharge Instructions (Signed)
Rhabdomyolysis °Rhabdomyolysis is the breakdown of muscle fibers due to injury. The injury may come from physical damage to the muscle like an injury but other causes are: °· High fever (hyperthermia). °· Seizures (convulsions). °· Low phosphate levels. °· Diseases of metabolism. °· Heatstroke. °· Drug toxicity. °· Over exertion. °· Alcoholism. °· Muscle is cut off from oxygen (anoxia). °· The squeezing of nerves and blood vessels (compartment syndrome). °Some drugs which may cause the breakdown of muscle are: °· Antibiotics. °· Statins. °· Alcohol. °· Animal toxins. °Myoglobin is a substance which helps muscle use oxygen. When the muscle is damaged, the myoglobin is released into the bloodstream. It is filtered out of the bloodstream by the kidneys. Myoglobin may block up the kidneys. This may cause damage, such as kidney failure. It also breaks down into other damaging toxic parts, which also cause kidney failure.  °SYMPTOMS  °· Dark, red, or tea colored urine. °· Weakness of affected muscles. °· Weight gain from water retention. °· Joint aches and pains. °· Irregular heart from high potassium in the blood. °· Muscle tenderness or aching. °· Generalized weakness. °· Seizures. °· Feeling tired (fatigue). °DIAGNOSIS  °Your caregiver may find muscle tenderness on exam and suspect the problem. Urine tests and blood work can confirm the problem. °TREATMENT     °· Early and aggressive treatment with large amounts of fluids may help prevent kidney failure. °· Water producing medicine (diuretic) may be used to help flush the kidneys. °· High potassium and calcium problems (electrolyte) in your blood may need treatment. °HOME CARE INSTRUCTIONS  °This problem is usually cared for in a hospital. If you are allowed to go home and require dialysis, make sure you keep all appointments for lab work and dialysis. Not doing so could result in death. °Document Released: 05/22/2004 Document Revised: 09/01/2011 Document Reviewed:  12/04/2008 °Exit[employer]® Patient Information ©2015 [employer]. This information is not intended to replace advice given to you by your health care provider. Make sure you discuss any questions you have with your health care provider. ° °

## 2014-11-27 ENCOUNTER — Telehealth: Payer: Self-pay | Admitting: *Deleted

## 2014-11-27 ENCOUNTER — Ambulatory Visit: Payer: Medicare Other | Admitting: Family Medicine

## 2014-11-27 ENCOUNTER — Telehealth: Payer: Self-pay | Admitting: Family Medicine

## 2014-11-27 NOTE — Telephone Encounter (Signed)
lmovm for pt daughter to return call.

## 2014-11-27 NOTE — Telephone Encounter (Signed)
Patient's daughter was going to see if she can bring her in for the shot tomorrow. She told me you would be able to work her in if she is able to bring her. Please advise

## 2014-11-27 NOTE — Telephone Encounter (Signed)
Pt was on tcm list d/c 11/26/14 and sent to [facility]. Did not set-up tcm appt with PCP...Betty Chan

## 2014-11-28 NOTE — Telephone Encounter (Signed)
Spoke to pt's daughter. She is going to bring the pt tomorrow afternoon to have her injection.

## 2014-11-29 ENCOUNTER — Encounter: Payer: Self-pay | Admitting: Family Medicine

## 2014-11-29 ENCOUNTER — Ambulatory Visit (INDEPENDENT_AMBULATORY_CARE_PROVIDER_SITE_OTHER): Payer: Medicare Other | Admitting: Family Medicine

## 2014-11-29 VITALS — BP 114/70 | HR 65 | Ht 66.0 in | Wt 186.0 lb

## 2014-11-29 DIAGNOSIS — M1990 Unspecified osteoarthritis, unspecified site: Secondary | ICD-10-CM

## 2014-11-29 DIAGNOSIS — M1991 Primary osteoarthritis, unspecified site: Secondary | ICD-10-CM

## 2014-11-29 NOTE — Progress Notes (Signed)
Pre visit review using our clinic review tool, if applicable. No additional management support is needed unless otherwise documented below in the visit note. 

## 2014-11-29 NOTE — Assessment & Plan Note (Signed)
30 injection given today patient will come back in one week for fourth injection and will continue to conservative therapy in the interim.

## 2014-11-29 NOTE — Progress Notes (Signed)
  [facility] [place],  [address] Phone: [phone] Subjective:     CC: Right knee painfollow-up.   DGL:OVFIEPPIRJ Betty Chan is a 81 y.o. female coming in with complaint of right knee pain. Patient has been having right knee pain for multiple years. Patient is been diagnosed with severe osteophytic changes of the knee previously. Patient was seen 2 months ago and was given an injection, home exercises, bracing as well as an icing protocol. Patient started having worsening pain again. Patient states that the steroid injection does not seem to be helping as long duration. Patient states that this is affecting her daily activities and she has failed all other conservative therapy at this time.  Patient is here for the third in a series of 4 injections of viscous supplementation for the knee. Patient states he is doing significantly better she states. Patient did have a recent fall with significant bruising but is doing much better.     Past medical history, social, surgical and family history all reviewed in electronic medical record.   Review of Systems: No headache, visual changes, nausea, vomiting, diarrhea, constipation, dizziness, abdominal pain, skin rash, fevers, chills, night sweats, weight loss, swollen lymph nodes, body aches, joint swelling, muscle aches, chest pain, shortness of breath, mood changes.   Objective Blood pressure 114/70, pulse 65, height 5\' 6"  (1.676 m), weight 186 lb (84.369 kg), SpO2 98 %.  General: No apparent distress alert and oriented x3 mood and affect normal, dressed appropriately.  HEENT: Pupils equal, extraocular movements intact  Respiratory: Patient's speak in full sentences and does not appear short of breath  Cardiovascular: No lower extremity edema, non tender, no erythema  Skin: Warm dry intact with no signs of infection or rash on extremities or on axial skeleton.  Abdomen: Soft nontender    Neuro: Cranial nerves II through XII are intact, neurovascularly intact in all extremities with 2+ DTRs and 2+ pulses.  Lymph: No lymphadenopathy of posterior or anterior cervical chain or axillae bilaterally.  Gait ambulates with an aid of a walker MSK:  Non tender with full range of motion and good stability and symmetric strength and tone of shoulders, elbows, wrist, hip, and ankles bilaterally. Significant osteophytic changes of multiple joints Knee: Right Significant valgus deformity of the right knee Diffuse tenderness with most tenderness over the medial joint line Range of motion lacks last 5 of extension and flexion Ligaments with solid consistent endpoints including ACL, PCL, LCL, MCL.  painful patellar compression. Patellar glide with moderate crepitus. Patellar and quadriceps tendons unremarkable. Hamstring and quadriceps strength is normal.  Contralateral knee has degenerative changes but is nontender to palpation No change from previous exam  procedure After informed written and verbal consent, patient was seated on exam table. Right knee was prepped with alcohol swab and utilizing anterolateral approach, patient's right knee space was injected with 15 mg/2.5 mL of Orthovisc (sodium hyaluronate) in a prefilled syringe was injected easily into the knee through a 22-gauge needle  Patient tolerated the procedure well without immediate complications.    Impression and Recommendations:     This case required medical decision making of moderate complexity.

## 2014-11-29 NOTE — Patient Instructions (Signed)
Good to see you Stay up riht if you can ;) See me next week for final injection.

## 2014-12-04 ENCOUNTER — Telehealth: Payer: Self-pay | Admitting: Family Medicine

## 2014-12-04 ENCOUNTER — Ambulatory Visit: Payer: Medicare Other | Admitting: Family Medicine

## 2014-12-04 NOTE — Telephone Encounter (Signed)
Spoke to pt's daughter. Scheduled her for tomorrow at 12:45.

## 2014-12-04 NOTE — Telephone Encounter (Signed)
Patient's daughter [name] would like to get her in tomorrow somewhere around 83 or 1 for her 4th injection and she said you will usually work her in. Please call daughter

## 2014-12-05 ENCOUNTER — Ambulatory Visit (INDEPENDENT_AMBULATORY_CARE_PROVIDER_SITE_OTHER): Payer: Medicare Other | Admitting: Family Medicine

## 2014-12-05 ENCOUNTER — Encounter: Payer: Self-pay | Admitting: Family Medicine

## 2014-12-05 VITALS — BP 116/64 | HR 75

## 2014-12-05 DIAGNOSIS — M1990 Unspecified osteoarthritis, unspecified site: Secondary | ICD-10-CM

## 2014-12-05 DIAGNOSIS — M1991 Primary osteoarthritis, unspecified site: Secondary | ICD-10-CM

## 2014-12-05 NOTE — Progress Notes (Signed)
  [facility] [place], [place] [address] Phone: [phone] Subjective:     CC: Right knee painfollow-up.   Betty Chan Betty Chan is a 81 y.o. female coming in with complaint of right knee pain. Patient has been having right knee pain for multiple years. Patient is been diagnosed with severe osteophytic changes of the knee previously. Patient was seen 2 months ago and was given an injection, home exercises, bracing as well as an icing protocol. Patient started having worsening pain again. Patient states that the steroid injection does not seem to be helping as long duration. Patient states that this is affecting her daily activities and she has failed all other conservative therapy at this time.  Patient is here for the 4th in a series of 4 injections of viscous supplementation for the knee. Patient states he is doing significantly better she states.  Patient continues to improve with every injection.     Past medical history, social, surgical and family history all reviewed in electronic medical record.   Review of Systems: No headache, visual changes, nausea, vomiting, diarrhea, constipation, dizziness, abdominal pain, skin rash, fevers, chills, night sweats, weight loss, swollen lymph nodes, body aches, joint swelling, muscle aches, chest pain, shortness of breath, mood changes.   Objective Blood pressure 116/64, pulse 75, SpO2 97 %.  General: No apparent distress alert and oriented x3 mood and affect normal, dressed appropriately.  HEENT: Pupils equal, extraocular movements intact  Respiratory: Patient's speak in full sentences and does not appear short of breath  Cardiovascular: No lower extremity edema, non tender, no erythema  Skin: Warm dry intact with no signs of infection or rash on extremities or on axial skeleton.  Abdomen: Soft nontender  Neuro: Cranial nerves II through XII are intact, neurovascularly intact in all  extremities with 2+ DTRs and 2+ pulses.  Lymph: No lymphadenopathy of posterior or anterior cervical chain or axillae bilaterally.  Gait ambulates with an aid of a walker MSK:  Non tender with full range of motion and good stability and symmetric strength and tone of shoulders, elbows, wrist, hip, and ankles bilaterally. Significant osteophytic changes of multiple joints Knee: Right Significant valgus deformity of the right knee Diffuse tenderness with most tenderness over the medial joint line Range of motion lacks last 5 of extension and flexion Ligaments with solid consistent endpoints including ACL, PCL, LCL, MCL.  painful patellar compression. Patellar glide with moderate crepitus. Patellar and quadriceps tendons unremarkable. Hamstring and quadriceps strength is normal.  Contralateral knee has degenerative changes but is nontender to palpation No change from previous exam  procedure After informed written and verbal consent, patient was seated on exam table. Right knee was prepped with alcohol swab and utilizing anterolateral approach, patient's right knee space was injected with 15 mg/2.5 mL of Orthovisc (sodium hyaluronate) in a prefilled syringe was injected easily into the knee through a 22-gauge needle  Patient tolerated the procedure well without immediate complications.    Impression and Recommendations:     This case required medical decision making of moderate complexity.

## 2014-12-05 NOTE — Patient Instructions (Addendum)
Good to see you Last time to see me for a month! Continue the exercises and the vitamins See me again in 4 weeks.

## 2014-12-05 NOTE — Assessment & Plan Note (Signed)
Patient again for the final injection in the Orthovisc series. We discussed icing regimen and home exercises. We discussed what activities to do and which ones to avoid. We discussed patient has any instability that she can come back sooner otherwise patient will follow-up in 4-6 weeks for further evaluation. Patient knows that we can repeat the series every 6 months if necessary.

## 2014-12-05 NOTE — Progress Notes (Signed)
Pre visit review using our clinic review tool, if applicable. No additional management support is needed unless otherwise documented below in the visit note. 

## 2014-12-21 ENCOUNTER — Ambulatory Visit: Payer: Medicare Other | Admitting: Internal Medicine

## 2015-01-02 ENCOUNTER — Encounter: Payer: Self-pay | Admitting: Internal Medicine

## 2015-01-02 ENCOUNTER — Ambulatory Visit (INDEPENDENT_AMBULATORY_CARE_PROVIDER_SITE_OTHER): Payer: Medicare Other | Admitting: Internal Medicine

## 2015-01-02 VITALS — BP 114/60 | HR 85 | Temp 98.0°F | Resp 14 | Ht 66.0 in | Wt 183.8 lb

## 2015-01-02 DIAGNOSIS — Z954 Presence of other heart-valve replacement: Secondary | ICD-10-CM | POA: Diagnosis not present

## 2015-01-02 DIAGNOSIS — I872 Venous insufficiency (chronic) (peripheral): Secondary | ICD-10-CM

## 2015-01-02 DIAGNOSIS — M1991 Primary osteoarthritis, unspecified site: Secondary | ICD-10-CM

## 2015-01-02 DIAGNOSIS — Z952 Presence of prosthetic heart valve: Secondary | ICD-10-CM

## 2015-01-02 DIAGNOSIS — M1990 Unspecified osteoarthritis, unspecified site: Secondary | ICD-10-CM

## 2015-01-02 NOTE — Progress Notes (Signed)
Pre visit review using our clinic review tool, if applicable. No additional management support is needed unless otherwise documented below in the visit note. 

## 2015-01-02 NOTE — Patient Instructions (Signed)
We have placed the orders for home health.   We have also given you the prescription for 2 sets of compression stockings to help with the fluid in your ankles.

## 2015-01-03 DIAGNOSIS — I872 Venous insufficiency (chronic) (peripheral): Secondary | ICD-10-CM | POA: Insufficient documentation

## 2015-01-03 NOTE — Assessment & Plan Note (Signed)
Given rx for compression stockings to help decrease the fluid in her legs and keep her mobile in the home. No signs of cellulitis of pain to suggest DVT.

## 2015-01-03 NOTE — Progress Notes (Signed)
OT Note - addendum    26-Dec-2014 1235  OT Visit Information  Last OT Received On 12-26-14  OT G-codes **NOT FOR INPATIENT CLASS**  Functional Assessment Tool Used clinical judgement  Functional Limitation Self care  Self Care Current Status [phone]) CM  Self Care Goal Status ([id number]) CM  Self Care Discharge Status [phone]) CM  [facility], OTR/L  [phone] 2014-12-26

## 2015-01-03 NOTE — Assessment & Plan Note (Signed)
This PT will greatly help her mobility and pain from her osteoarthritis and will order home health PT as her mobility is currently such that she cannot leave home independently (with her daughter today).

## 2015-01-03 NOTE — Progress Notes (Signed)
   Subjective:    Patient ID: Betty Chan, female    DOB: 08-16-1929, 82 y.o.   MRN: [id number]  HPI The patient is an 81 YO female coming in for her severe osteoarthritis. She recently was in [facility] and they want her to continue therapy. She has been doing much better with her mobility with therapy and this has decreased the amount of pain she has to live with. Ever since her aortic valve replacement she has not had the problems with her breathing and she has been able to start to rebuild her strength and endurance. Struggling with some bilateral ankle swelling since walking more. She does struggle with it all the time.   Review of Systems  Constitutional: Positive for activity change. Negative for fever, appetite change, fatigue and unexpected weight change.  Respiratory: Negative for cough, chest tightness, shortness of breath and wheezing.   Cardiovascular: Positive for leg swelling. Negative for chest pain and palpitations.  Gastrointestinal: Negative for nausea, abdominal pain, diarrhea, constipation and abdominal distention.  Musculoskeletal: Positive for arthralgias and gait problem. Negative for myalgias and back pain.  Skin: Negative.       Objective:   Physical Exam  Constitutional: She is oriented to person, place, and time. She appears well-developed and well-nourished.  HENT:  Head: Normocephalic and atraumatic.  Eyes: EOM are normal.  Neck: Normal range of motion.  Cardiovascular: Normal rate and regular rhythm.   Pulmonary/Chest: Effort normal and breath sounds normal.  Abdominal: Soft. She exhibits no distension. There is no tenderness. There is no rebound.  Musculoskeletal: She exhibits edema.  2-3+ edema bilateral to the mid shin  Neurological: She is alert and oriented to person, place, and time.  Skin: Skin is warm and dry.   Filed Vitals:   01/02/15 1532  BP: 114/60  Pulse: 85  Temp: 98 F (36.7 C)  TempSrc: Oral  Resp: 14  Height: 5\' 6"  (1.676  m)  Weight: 183 lb 12.8 oz (83.371 kg)  SpO2: 96%      Assessment & Plan:

## 2015-01-11 ENCOUNTER — Telehealth: Payer: Self-pay | Admitting: Internal Medicine

## 2015-01-11 NOTE — Telephone Encounter (Signed)
States Dr. Doug Sou sent a referral in for PT to [facility] on patients last visit.  States patient is already having PT.  States patients last PT was the 19th.  States going back out to see patient on 26th.  States they are working on orders from  DR.Wenda Low.

## 2015-01-11 NOTE — Telephone Encounter (Signed)
Is requesting order for home health nurse for wound care on second toe on left foot.  States has been bothering patient for two days and patient did not sleep well last night because it was bothering her.

## 2015-01-11 NOTE — Telephone Encounter (Signed)
Per previous note, HH is not needed.

## 2015-01-12 NOTE — Telephone Encounter (Signed)
Betty Chan, could you please help me with this.  There is another note I typed up on before this note.  Betty Chan called in stating they did not need a referral for OT from Dr. Doug Chan because they had an ED doctor who had already started this process.  Then I had this note after the fact requesting for wound care for the patient on the left foot.  I sent this to Betty Chan and she responded below.  Is there a miscommunication on [facility]'s end? Or can an order be sent over from Dr. Asa Chan in regards to wound care?

## 2015-01-12 NOTE — Telephone Encounter (Signed)
Spoke with Betty Chan and found out that since issue was not address during the last OV patient would need to come in for a visit.  Did call nurse and notified.  Also, left vm for patient to call our office to schedule as visit.  Did let patient know we do have [facility] available.

## 2015-01-15 ENCOUNTER — Ambulatory Visit (INDEPENDENT_AMBULATORY_CARE_PROVIDER_SITE_OTHER)
Admission: RE | Admit: 2015-01-15 | Discharge: 2015-01-15 | Disposition: A | Discharge status: Home / Self Care | Payer: Medicare Other | Source: Ambulatory Visit | Attending: Internal Medicine | Admitting: Internal Medicine

## 2015-01-15 ENCOUNTER — Ambulatory Visit (INDEPENDENT_AMBULATORY_CARE_PROVIDER_SITE_OTHER): Payer: Medicare Other | Admitting: Internal Medicine

## 2015-01-15 ENCOUNTER — Encounter: Payer: Self-pay | Admitting: Internal Medicine

## 2015-01-15 VITALS — BP 122/68 | HR 77 | Temp 97.4°F | Resp 14 | Wt 180.0 lb

## 2015-01-15 DIAGNOSIS — I739 Peripheral vascular disease, unspecified: Secondary | ICD-10-CM | POA: Diagnosis not present

## 2015-01-15 DIAGNOSIS — M79675 Pain in left toe(s): Secondary | ICD-10-CM | POA: Diagnosis not present

## 2015-01-15 DIAGNOSIS — L98499 Non-pressure chronic ulcer of skin of other sites with unspecified severity: Secondary | ICD-10-CM | POA: Diagnosis not present

## 2015-01-15 DIAGNOSIS — I70209 Unspecified atherosclerosis of native arteries of extremities, unspecified extremity: Secondary | ICD-10-CM

## 2015-01-15 NOTE — Progress Notes (Signed)
Pre visit review using our clinic review tool, if applicable. No additional management support is needed unless otherwise documented below in the visit note. 

## 2015-01-15 NOTE — Patient Instructions (Signed)
  Your next office appointment will be determined based upon review of your pending arterial studies  and  xrays  Those written interpretation of the lab results and instructions will be transmitted to you by mail for your records.  Critical results will be called.   Followup as needed for any active or acute issue. Please report any significant change in your symptoms.

## 2015-01-15 NOTE — Progress Notes (Signed)
   Subjective:    Patient ID: Betty Chan, female    DOB: 09-15-1929, 81 y.o.   MRN: [id number]  HPI She's had pain in the left second toe for several months.   The toe throbs at night and is painful with walking or wearing shoes. At the distal end of the toe there has been a small plaque-like lesion. She was getting pedicures and they were scraping off the lesion. She treated the skin lesion with Neosporin and Vaseline.  Review of Systems  There's been no associated purulence, fever, or chills.   She has had chronic edema of the lower extremities. Compression hose have been of benefit for this .     Objective:   Physical Exam  Pertinent or positive findings include: There is a 6 x 6 mm dry pale eschar over the distal second left toe. She has 2+ pedal edema. Pedal pulses are decreased, particularly the posterior tibial pulse. Pes planus is noted.She is using a rolling walker.  General appearance :adequately nourished; in no distress.  Eyes: No conjunctival inflammation or scleral icterus is present.  Heart:  Normal rate and regular rhythm. S1 and S2 normal without gallop, murmur, click, rub or other extra sounds    Lungs:Chest clear to auscultation; no wheezes, rhonchi,rales ,or rubs present.No increased work of breathing.   Abdomen: bowel sounds normal, soft and non-tender without masses, organomegaly or hernias noted.  No guarding or rebound.  Vascular : all pulses equal ; no bruits present.  Skin:Warm & dry.  Intact without suspicious lesions or rashes ; no tenting or jaundice   Lymphatic: No lymphadenopathy is noted about the head, neck, axilla  Neuro: Strength, tone decreased        Assessment & Plan:  #1 pain in left toe; rule out ulcer related to atherosclerosis. Rule out osteomyelitis.  See orders

## 2015-01-26 ENCOUNTER — Telehealth: Payer: Self-pay | Admitting: Internal Medicine

## 2015-01-26 NOTE — Telephone Encounter (Signed)
FYI: Patient had fall last Friday.  Refused to go to ER but EMS was called.  Patient did not complain of any pain or soreness.  Patient does has bruise on right forearm.

## 2015-01-29 ENCOUNTER — Ambulatory Visit (HOSPITAL_COMMUNITY)
Admission: RE | Admit: 2015-01-29 | Discharge: 2015-01-29 | Disposition: A | Discharge status: Home / Self Care | Payer: Medicare Other | Source: Ambulatory Visit | Attending: Internal Medicine | Admitting: Internal Medicine

## 2015-01-29 DIAGNOSIS — I70209 Unspecified atherosclerosis of native arteries of extremities, unspecified extremity: Secondary | ICD-10-CM

## 2015-01-29 DIAGNOSIS — M79675 Pain in left toe(s): Secondary | ICD-10-CM | POA: Diagnosis not present

## 2015-01-29 DIAGNOSIS — I739 Peripheral vascular disease, unspecified: Secondary | ICD-10-CM

## 2015-01-29 DIAGNOSIS — L98499 Non-pressure chronic ulcer of skin of other sites with unspecified severity: Secondary | ICD-10-CM

## 2015-01-31 ENCOUNTER — Telehealth: Payer: Self-pay | Admitting: Internal Medicine

## 2015-01-31 NOTE — Telephone Encounter (Signed)
Cecille Rubin [phone]  [facility]    Pt had a fall on the 8/3  She needs verbal orders for OT for pt

## 2015-02-01 NOTE — Telephone Encounter (Signed)
Verbal order given  

## 2015-03-14 ENCOUNTER — Telehealth: Payer: Self-pay | Admitting: Internal Medicine

## 2015-03-14 NOTE — Telephone Encounter (Signed)
Elanore from [facility] called to let you know they put patients home health visits on hold because patient has not come back from her trip. She is expected to come back end of month and they will resume it.

## 2015-03-30 ENCOUNTER — Telehealth: Payer: Self-pay | Admitting: Internal Medicine

## 2015-03-30 NOTE — Telephone Encounter (Signed)
Spoke to pt's daughter. Scheduled pt for 1030 on Monday.

## 2015-03-30 NOTE — Telephone Encounter (Signed)
Pt daughter called in and said that pt knee was really bothering her and wanted to know if she could be worked in Monday?     Best number to call daughter is [phone]

## 2015-04-02 ENCOUNTER — Encounter: Payer: Self-pay | Admitting: Family Medicine

## 2015-04-02 ENCOUNTER — Ambulatory Visit (INDEPENDENT_AMBULATORY_CARE_PROVIDER_SITE_OTHER): Payer: Medicare Other | Admitting: Family Medicine

## 2015-04-02 VITALS — BP 128/70 | HR 78 | Ht 66.0 in | Wt 180.0 lb

## 2015-04-02 DIAGNOSIS — R252 Cramp and spasm: Secondary | ICD-10-CM | POA: Diagnosis not present

## 2015-04-02 DIAGNOSIS — M1990 Unspecified osteoarthritis, unspecified site: Secondary | ICD-10-CM

## 2015-04-02 DIAGNOSIS — M1991 Primary osteoarthritis, unspecified site: Secondary | ICD-10-CM

## 2015-04-02 NOTE — Progress Notes (Signed)
  [name] [place], [place] [address] Phone: [phone] Subjective:     CC: Right knee painfollow-up.   RCB:ULAGTXMIWO Betty Chan is a 81 y.o. female coming in with complaint of right knee pain. Patient has been having right knee pain for multiple years. Patient is been diagnosed with severe osteophytic changes of the knee previously. Patient 5 months ago did finish viscous supplementation. Was doing very well until the last week when the knee started giving her problems him. Patient describes the pain as more of a dull throbbing aching sensation. About the same as what it was previously. Rates the severity of pain a 7 out of 10. Patient is still ambulating with the aid of a walker.     Past medical history, social, surgical and family history all reviewed in electronic medical record.   Review of Systems: No headache, visual changes, nausea, vomiting, diarrhea, constipation, dizziness, abdominal pain, skin rash, fevers, chills, night sweats, weight loss, swollen lymph nodes, body aches, joint swelling, muscle aches, chest pain, shortness of breath, mood changes.   Objective Blood pressure 128/70, pulse 78, height 5\' 6"  (1.676 m), weight 180 lb (81.647 kg), SpO2 95 %.  General: No apparent distress alert and oriented x3 mood and affect normal, dressed appropriately.  HEENT: Pupils equal, extraocular movements intact  Respiratory: Patient's speak in full sentences and does not appear short of breath  Cardiovascular: No lower extremity edema, non tender, no erythema  Skin: Warm dry intact with no signs of infection or rash on extremities or on axial skeleton.  Abdomen: Soft nontender  Neuro: Cranial nerves II through XII are intact, neurovascularly intact in all extremities with 2+ DTRs and 2+ pulses.  Lymph: No lymphadenopathy of posterior or anterior cervical chain or axillae bilaterally.  Gait ambulates with an aid of a walker MSK:  Non  tender with full range of motion and good stability and symmetric strength and tone of shoulders, elbows, wrist, hip, and ankles bilaterally. Significant osteophytic changes of multiple joints Knee: Right Significant valgus deformity of the right knee Diffuse tenderness with most tenderness over the medial joint line Range of motion lacks last 5 of extension and flexion Ligaments with solid consistent endpoints including ACL, PCL, LCL, MCL.  painful patellar compression. Patellar glide with moderate crepitus. Patellar and quadriceps tendons unremarkable. Hamstring and quadriceps strength is normal.  Contralateral knee has degenerative changes but is nontender to palpation No change from previous exam  procedure After informed written and verbal consent, patient was seated on exam table. Right knee was prepped with alcohol swab and utilizing anterolateral approach, patient's right knee space was injected with 4:1  marcaine 0.5%: Kenalog 40mg /dL. Patient tolerated the procedure well without immediate complications.    Impression and Recommendations:     This case required medical decision making of moderate complexity.

## 2015-04-02 NOTE — Patient Instructions (Addendum)
Good to see you I hope this helps.  You keep trucking along.  Exercises 3 times a week and ice daily.  12/15 would be the next time to be able to do the Orthovisc.

## 2015-04-02 NOTE — Assessment & Plan Note (Addendum)
Patient given another steroid injection today. We discussed icing regimen and home exercises. We discussed the possibility of a custom brace which patient declined. Patient will continue to angulate with the aid of a walker. Patient will come back in 2 months and at that time she would be able to start the viscous supplementation again.  I do feel that a custom brace could be beneficial. Patient will need to have a custom brace secondary to the thigh to calf ratio being extreme. This will help unload the osteoarthritis as well as give stability. We'll help with pain management as well.

## 2015-04-02 NOTE — Progress Notes (Signed)
Pre visit review using our clinic review tool, if applicable. No additional management support is needed unless otherwise documented below in the visit note. 

## 2015-04-04 ENCOUNTER — Other Ambulatory Visit: Payer: Self-pay

## 2015-04-04 DIAGNOSIS — Z1231 Encounter for screening mammogram for malignant neoplasm of breast: Secondary | ICD-10-CM

## 2015-04-10 ENCOUNTER — Telehealth: Payer: Self-pay | Admitting: Internal Medicine

## 2015-04-10 NOTE — Telephone Encounter (Signed)
Noted  

## 2015-04-10 NOTE — Telephone Encounter (Signed)
FYI: States patient had a fall this past Friday but she did not hurt herself.  She did not go to the doctor.  She was not complaining of soreness.

## 2015-04-11 ENCOUNTER — Telehealth: Payer: Self-pay | Admitting: Family Medicine

## 2015-04-11 NOTE — Telephone Encounter (Signed)
Daughter needs to schedule injections in December for mom but she is not sure how many injections she needs.  Can you please follow up on this.  Also donjoi has not followed up on knee brace.

## 2015-04-12 NOTE — Telephone Encounter (Signed)
Spoke to pt's daughter, scheduled her for 12.15.16 @ 1130.

## 2015-04-13 DIAGNOSIS — R2689 Other abnormalities of gait and mobility: Secondary | ICD-10-CM | POA: Diagnosis not present

## 2015-04-24 ENCOUNTER — Telehealth: Payer: Self-pay | Admitting: Internal Medicine

## 2015-04-24 NOTE — Telephone Encounter (Signed)
Royal Hawthorn a physical therapist w/[facility] would like a order for physical therapy twice a week for two weeks and taper it down to once a week for three weeks.

## 2015-04-25 NOTE — Telephone Encounter (Signed)
Verbal okay given.  

## 2015-04-26 NOTE — Addendum Note (Signed)
Addended by: Lyndal Pulley on: 04/26/2015 04:07 PM   Modules accepted: Miquel Dunn

## 2015-05-02 DIAGNOSIS — I872 Venous insufficiency (chronic) (peripheral): Secondary | ICD-10-CM | POA: Diagnosis not present

## 2015-05-03 ENCOUNTER — Telehealth: Payer: Self-pay | Admitting: *Deleted

## 2015-05-03 NOTE — Telephone Encounter (Signed)
MD out of office pls advise.../lmb 

## 2015-05-03 NOTE — Telephone Encounter (Signed)
If she does not want to get then she can discuss at next office visit. Definitely needs yearly breast exam with [place] or gyn.

## 2015-05-03 NOTE — Telephone Encounter (Signed)
Left msg on triage stating mom schedule for mammogram for tomorrow. She is wondering does she really need to have done. Cleared from breast cancer back in 2004..../LMB

## 2015-05-03 NOTE — Telephone Encounter (Signed)
Notified pt daughter with md response.../lmb 

## 2015-05-04 ENCOUNTER — Ambulatory Visit
Admission: RE | Admit: 2015-05-04 | Discharge: 2015-05-04 | Disposition: A | Discharge status: Home / Self Care | Payer: Medicare Other | Source: Ambulatory Visit

## 2015-05-04 DIAGNOSIS — Z1231 Encounter for screening mammogram for malignant neoplasm of breast: Secondary | ICD-10-CM

## 2015-05-25 ENCOUNTER — Encounter: Payer: Self-pay | Admitting: Internal Medicine

## 2015-05-25 ENCOUNTER — Ambulatory Visit (INDEPENDENT_AMBULATORY_CARE_PROVIDER_SITE_OTHER): Payer: Medicare Other | Admitting: Internal Medicine

## 2015-05-25 VITALS — BP 106/68 | HR 75 | Temp 98.3°F | Ht 66.0 in

## 2015-05-25 DIAGNOSIS — D489 Neoplasm of uncertain behavior, unspecified: Secondary | ICD-10-CM | POA: Diagnosis not present

## 2015-05-25 NOTE — Progress Notes (Signed)
Pre visit review using our clinic review tool, if applicable. No additional management support is needed unless otherwise documented below in the visit note. 

## 2015-05-25 NOTE — Patient Instructions (Signed)
The Podiatry referral will be scheduled and you'll be notified of the time.Please call the Referral Co-Ordinator @ [phone] if you have not been notified of appointment time within 7-10 days. 

## 2015-05-25 NOTE — Progress Notes (Signed)
   Subjective:    Patient ID: Betty Chan, female    DOB: [birth year], 81 y.o.   MRN: [id number]  HPI She was seen in July for a painful lesion of the second left toe. X-rays at that time were negative for osteomyelitis. The pain has persisted and actually has been worse the last several weeks.  There are no associated constitutional symptoms.  A review of the chart reveals she was hospitalized after a fall in June of this year. At that time her CK was 01408 but did drop to 723. She is on atorvastatin. She denies any significant myalgias.   Review of Systems She specifically denies fever, chills, sweats, weight loss.  There's been no associated redness, vesicular formation, or purulence at the left great toe.    Objective:   Physical Exam Pertinent or positive findings include: Ptosis is present more on the left than the right. Pedal pulses are decreased, especially posterior tibial pulses. There are no definite ischemic changes the feet. There is one half-1+ edema as well as lymphedema of the ankles. There is a thickened area of dense skin  7 x 6 mm over the distal second left toe with a central subcutaneous hyperpigmentation.  General appearance :adequately nourished; in no distress.  Eyes: No conjunctival inflammation or scleral icterus is present.  Heart:  Normal rate and regular rhythm. S1 and S2 normal without gallop, murmur, click, rub or other extra sounds    Lungs:Chest clear to auscultation; no wheezes, rhonchi,rales ,or rubs present.No increased work of breathing.   Abdomen: bowel sounds normal, soft and non-tender without masses, organomegaly or hernias noted.  No guarding or rebound.   Vascular : all pulses equal ; no bruits present.  Skin:Warm & dry.  Intact without suspicious lesions or rashes ; no tenting or jaundice   Lymphatic: No lymphadenopathy is noted about the head, neck, axilla.   Neuro: Strength, tone decreased. Using a rolling walker.       Assessment & Plan:  #1 painful toe lesion; rule out atypical plantar wart versus possible melanoma.  #2 CK elevation in the context of a fall; no clinical suggestion of statin induced myalgia.  Plan: Podiatry referral

## 2015-05-30 ENCOUNTER — Telehealth: Payer: Self-pay | Admitting: Internal Medicine

## 2015-05-30 NOTE — Telephone Encounter (Signed)
Need to continue PT.  Is requesting faxed order [phone].  States right leg has gotten weaker while waiting for PT orders.  Patient also has a new brace.  Nurse does not know how to put brace onto patient but Physical therapist does.

## 2015-05-30 NOTE — Telephone Encounter (Signed)
Verbal okay to continue PT for right leg weakening.

## 2015-05-30 NOTE — Telephone Encounter (Signed)
Legrand Como from [employer] called regarding this also and is wondering if you can call him at [phone] when orders are faxed.

## 2015-06-04 DIAGNOSIS — L97812 Non-pressure chronic ulcer of other part of right lower leg with fat layer exposed: Secondary | ICD-10-CM | POA: Diagnosis not present

## 2015-06-04 DIAGNOSIS — I872 Venous insufficiency (chronic) (peripheral): Secondary | ICD-10-CM | POA: Diagnosis not present

## 2015-06-04 DIAGNOSIS — I110 Hypertensive heart disease with heart failure: Secondary | ICD-10-CM

## 2015-06-04 DIAGNOSIS — I5042 Chronic combined systolic (congestive) and diastolic (congestive) heart failure: Secondary | ICD-10-CM

## 2015-06-06 ENCOUNTER — Encounter: Payer: Self-pay | Admitting: Family Medicine

## 2015-06-06 ENCOUNTER — Ambulatory Visit (INDEPENDENT_AMBULATORY_CARE_PROVIDER_SITE_OTHER): Payer: Medicare Other | Admitting: Family Medicine

## 2015-06-06 VITALS — BP 132/70 | HR 83 | Ht 66.0 in | Wt 180.0 lb

## 2015-06-06 DIAGNOSIS — M1991 Primary osteoarthritis, unspecified site: Secondary | ICD-10-CM

## 2015-06-06 DIAGNOSIS — M1711 Unilateral primary osteoarthritis, right knee: Secondary | ICD-10-CM | POA: Diagnosis not present

## 2015-06-06 NOTE — Patient Instructions (Signed)
Good to see you See you next week 

## 2015-06-06 NOTE — Assessment & Plan Note (Signed)
Patient did have fairly good resolution of pain for quite some time. Is starting to have increasing discomfort overall again though. Patient is not one any surgical intervention and because of patient's other comorbidities she would be a high risk surgical option. Patient was given first injection in a series of 4 injections. Patient come back and see me again in 1 week for second in a series of 4 injection. Discuss continuing conservative therapy. Patient may need to have some adjustments made to her brace. Patient will check in with the company. We will see her again next week.

## 2015-06-06 NOTE — Progress Notes (Signed)
Pre visit review using our clinic review tool, if applicable. No additional management support is needed unless otherwise documented below in the visit note. 

## 2015-06-06 NOTE — Progress Notes (Signed)
  [name] [address] [place], [place] [address] Phone: [phone] Subjective:     CC: Right knee painfollow-up.   [id number] Betty Chan is a 81 y.o. female coming in with complaint of right knee pain. Patient has been having right knee pain for multiple years. Patient is been diagnosed with severe osteophytic changes of the knee previously. Patient did have great result 6 months ago with the viscous supplementation. Started having increasing discomfort again. Patient states that there is still some instability. Not wearing the brace on a regular basis. Continues to walk with the aid of a walker.    Past medical history, social, surgical and family history all reviewed in electronic medical record.   Review of Systems: No headache, visual changes, nausea, vomiting, diarrhea, constipation, dizziness, abdominal pain, skin rash, fevers, chills, night sweats, weight loss, swollen lymph nodes, body aches, joint swelling, muscle aches, chest pain, shortness of breath, mood changes.   Objective Blood pressure 132/70, pulse 83, height 5\' 6"  (1.676 m), weight 180 lb (81.647 kg), SpO2 99 %.  General: No apparent distress alert and oriented x3 mood and affect normal, dressed appropriately.  HEENT: Pupils equal, extraocular movements intact  Respiratory: Patient's speak in full sentences and does not appear short of breath  Cardiovascular: No lower extremity edema, non tender, no erythema  Skin: Warm dry intact with no signs of infection or rash on extremities or on axial skeleton.  Abdomen: Soft nontender  Neuro: Cranial nerves II through XII are intact, neurovascularly intact in all extremities with 2+ DTRs and 2+ pulses.  Lymph: No lymphadenopathy of posterior or anterior cervical chain or axillae bilaterally.  Gait ambulates with an aid of a walker MSK:  Non tender with full range of motion and good stability and symmetric strength and tone of  shoulders, elbows, wrist, hip, and ankles bilaterally. Significant osteophytic changes of multiple joints Knee: Right Significant valgus deformity of the right knee mild instability Tenderness mostly over the medial joint line Range of motion lacks last 5 of extension and flexion Ligaments with solid consistent endpoints including ACL, PCL, LCL, MCL.  painful patellar compression. Patellar glide with moderate crepitus. Patellar and quadriceps tendons unremarkable. Hamstring and quadriceps strength is normal.  Contralateral knee has degenerative changes but is nontender to palpation Mild worsening from previous exam.  procedure After informed written and verbal consent, patient was seated on exam table. Right knee was prepped with alcohol swab and utilizing anterolateral approach, patient's right knee space was injected with 15 mg/2.5 mL of Orthovisc (sodium hyaluronate) in a prefilled syringe was injected easily into the knee through a 22-gauge needle.   Impression and Recommendations:     This case required medical decision making of moderate complexity.

## 2015-06-07 ENCOUNTER — Ambulatory Visit: Payer: Medicare Other | Admitting: Family Medicine

## 2015-06-13 ENCOUNTER — Encounter: Payer: Self-pay | Admitting: Family Medicine

## 2015-06-13 ENCOUNTER — Ambulatory Visit (INDEPENDENT_AMBULATORY_CARE_PROVIDER_SITE_OTHER): Payer: Medicare Other | Admitting: Family Medicine

## 2015-06-13 VITALS — BP 120/70 | HR 83 | Ht 66.0 in | Wt 180.0 lb

## 2015-06-13 DIAGNOSIS — M1990 Unspecified osteoarthritis, unspecified site: Secondary | ICD-10-CM

## 2015-06-13 DIAGNOSIS — M1991 Primary osteoarthritis, unspecified site: Secondary | ICD-10-CM

## 2015-06-13 NOTE — Assessment & Plan Note (Signed)
Patient in second in a series of 4 injections into the right knee today. Patient will be coming back in 1 week for another injection. Otherwise continue conservative therapy and encourage patient to wear the brace regularly.

## 2015-06-13 NOTE — Patient Instructions (Signed)
Good to see you Ice is your friend Stay active but use the cane  Possibly try the brace when walking a lot Happy holidays!  See you in a week.

## 2015-06-13 NOTE — Progress Notes (Signed)
Pre visit review using our clinic review tool, if applicable. No additional management support is needed unless otherwise documented below in the visit note. 

## 2015-06-13 NOTE — Progress Notes (Signed)
  [name] [address] [place], [place] [address] Phone: [phone] Subjective:     CC: Right knee painfollow-up.   [id number] Betty Chan is a 81 y.o. female coming in with complaint of right knee pain. Patient has been having right knee pain for multiple years. Patient is been diagnosed with severe osteophytic changes of the knee previously. Patient did have great result 6 months ago with the viscous supplementation. Started having increasing discomfort again. Patient states that there is still some instability. Not wearing the brace on a regular basis. Continues to walk with the aid of a walker.she is here for second in a series of 4 injections into the right knee. Patient states did have some improvement with the first injection. Patient is remaining active. Has not had any falls since last visit.    Past medical history, social, surgical and family history all reviewed in electronic medical record.   Review of Systems: No headache, visual changes, nausea, vomiting, diarrhea, constipation, dizziness, abdominal pain, skin rash, fevers, chills, night sweats, weight loss, swollen lymph nodes, body aches, joint swelling, muscle aches, chest pain, shortness of breath, mood changes.   Objective There were no vitals taken for this visit.  General: No apparent distress alert and oriented x3 mood and affect normal, dressed appropriately.  HEENT: Pupils equal, extraocular movements intact  Respiratory: Patient's speak in full sentences and does not appear short of breath  Cardiovascular: No lower extremity edema, non tender, no erythema  Skin: Warm dry intact with no signs of infection or rash on extremities or on axial skeleton.  Abdomen: Soft nontender  Neuro: Cranial nerves II through XII are intact, neurovascularly intact in all extremities with 2+ DTRs and 2+ pulses.  Lymph: No lymphadenopathy of posterior or anterior cervical chain or axillae  bilaterally.  Gait ambulates with an aid of a walker MSK:  Non tender with full range of motion and good stability and symmetric strength and tone of shoulders, elbows, wrist, hip, and ankles bilaterally. Significant osteophytic changes of multiple joints Knee: Right Significant valgus deformity of the right knee mild instability Tenderness mostly over the medial joint line patient does have some mild increasing bruising around the knee compared to previous exam. Range of motion lacks last 5 of extension and flexion Ligaments with solid consistent endpoints including ACL, PCL, LCL, MCL.  painful patellar compression. Patellar glide with moderate crepitus. Patellar and quadriceps tendons unremarkable. Hamstring and quadriceps strength is normal.  Contralateral knee has degenerative changes but is nontender to palpation Improving compared to previous exam.  procedure After informed written and verbal consent, patient was seated on exam table. Right knee was prepped with alcohol swab and utilizing anterolateral approach, patient's right knee space was injected with 15 mg/2.5 mL of Orthovisc (sodium hyaluronate) in a prefilled syringe was injected easily into the knee through a 22-gauge needle.   Impression and Recommendations:     This case required medical decision making of moderate complexity.

## 2015-06-20 ENCOUNTER — Ambulatory Visit (INDEPENDENT_AMBULATORY_CARE_PROVIDER_SITE_OTHER): Payer: Medicare Other | Admitting: Family Medicine

## 2015-06-20 VITALS — BP 126/82 | HR 80

## 2015-06-20 DIAGNOSIS — M1990 Unspecified osteoarthritis, unspecified site: Secondary | ICD-10-CM

## 2015-06-20 DIAGNOSIS — M1991 Primary osteoarthritis, unspecified site: Secondary | ICD-10-CM

## 2015-06-20 NOTE — Progress Notes (Signed)
  [facility] [place],  [address] Phone: [phone] Subjective:     CC: Right knee painfollow-up.   [id number] Betty Chan is a 81 y.o. female coming in with complaint of right knee pain. Patient has been having right knee pain for multiple years. Patient is been diagnosed with severe osteophytic changes of the knee previously. Patient did have great result 6 months ago with the viscous supplementation. Started having increasing discomfort again. Patient states that there is still some instability. Not wearing the brace on a regular basis. Continues to walk with the aid of a walker.she is here for third in a series of 4 injections into the right knee. Patient states did have some improvement with the first injection. Patient is remaining active. Has not had any falls since last visit.    Past medical history, social, surgical and family history all reviewed in electronic medical record.   Review of Systems: No headache, visual changes, nausea, vomiting, diarrhea, constipation, dizziness, abdominal pain, skin rash, fevers, chills, night sweats, weight loss, swollen lymph nodes, body aches, joint swelling, muscle aches, chest pain, shortness of breath, mood changes.   Objective Blood pressure 126/82, pulse 80, SpO2 97 %.  General: No apparent distress alert and oriented x3 mood and affect normal, dressed appropriately.  HEENT: Pupils equal, extraocular movements intact  Respiratory: Patient's speak in full sentences and does not appear short of breath  Cardiovascular: No lower extremity edema, non tender, no erythema  Skin: Warm dry intact with no signs of infection or rash on extremities or on axial skeleton.  Abdomen: Soft nontender  Neuro: Cranial nerves II through XII are intact, neurovascularly intact in all extremities with 2+ DTRs and 2+ pulses.  Lymph: No lymphadenopathy of posterior or anterior cervical chain or axillae  bilaterally.  Gait ambulates with an aid of a walker MSK:  Non tender with full range of motion and good stability and symmetric strength and tone of shoulders, elbows, wrist, hip, and ankles bilaterally. Significant osteophytic changes of multiple joints Knee: Right Significant valgus deformity of the right knee mild instability Tenderness mostly over the medial joint line patient does have some mild increasing bruising around the knee compared to previous exam.patient does have for 5 areas of bruising at this time. Range of motion lacks last 5 of extension and flexion Ligaments with solid consistent endpoints including ACL, PCL, LCL, MCL.  painful patellar compression. Patellar glide with moderate crepitus. Patellar and quadriceps tendons unremarkable. Hamstring and quadriceps strength is normal.  Contralateral knee has degenerative changes but is nontender to palpation Improving compared to previous exam.  procedure After informed written and verbal consent, patient was seated on exam table. Right knee was prepped with alcohol swab and utilizing anterolateral approach, patient's right knee space was injected with 15 mg/2.5 mL of Orthovisc (sodium hyaluronate) in a prefilled syringe was injected easily into the knee through a 22-gauge needle.   Impression and Recommendations:     This case required medical decision making of moderate complexity.

## 2015-06-20 NOTE — Progress Notes (Signed)
Pre visit review using our clinic review tool, if applicable. No additional management support is needed unless otherwise documented below in the visit note. 

## 2015-06-20 NOTE — Patient Instructions (Signed)
Good to see you Ice is your friend Stay active 1 more week and then you are done with me for a while Happy New Year!

## 2015-06-20 NOTE — Assessment & Plan Note (Signed)
During the series of 4 injections. Continues to improve well. One more injection in the series one will see patient in one week.

## 2015-06-27 ENCOUNTER — Encounter: Payer: Self-pay | Admitting: Family Medicine

## 2015-06-27 ENCOUNTER — Ambulatory Visit (INDEPENDENT_AMBULATORY_CARE_PROVIDER_SITE_OTHER): Payer: Medicare Other | Admitting: Family Medicine

## 2015-06-27 VITALS — BP 126/76 | HR 74

## 2015-06-27 DIAGNOSIS — M1991 Primary osteoarthritis, unspecified site: Secondary | ICD-10-CM

## 2015-06-27 DIAGNOSIS — M1711 Unilateral primary osteoarthritis, right knee: Secondary | ICD-10-CM

## 2015-06-27 NOTE — Assessment & Plan Note (Signed)
Fourth and final viscous supplementation injection. Can repeat in 6 months if needed. We'll follow-up as needed or in the interim we can do a steroid injection.

## 2015-06-27 NOTE — Patient Instructions (Signed)
Good to see you Ice when you need it You did it! See me again in 4 weeks.

## 2015-06-27 NOTE — Progress Notes (Signed)
  [name] [address] [place], [place] [address] Phone: [phone] Subjective:     CC: Right knee painfollow-up.   [id number] Betty Chan is a 82 y.o. female coming in with complaint of right knee pain. Patient has been having right knee pain for multiple years. Patient is been diagnosed with severe osteophytic changes of the knee previously. Patient did have great result 6 months ago with the viscous supplementation. Started having increasing discomfort again. Patient states that there is still some instability. Not wearing the brace on a regular basis. Continues to walk with the aid of a walker.she is here for fourth in a series of 4 injections into the right knee. Continues to see improvement overall. Continues to increase her activity.    Past medical history, social, surgical and family history all reviewed in electronic medical record.   Review of Systems: No headache, visual changes, nausea, vomiting, diarrhea, constipation, dizziness, abdominal pain, skin rash, fevers, chills, night sweats, weight loss, swollen lymph nodes, body aches, joint swelling, muscle aches, chest pain, shortness of breath, mood changes.   Objective Blood pressure 126/76, pulse 74, SpO2 96 %.  General: No apparent distress alert and oriented x3 mood and affect normal, dressed appropriately.  HEENT: Pupils equal, extraocular movements intact  Respiratory: Patient's speak in full sentences and does not appear short of breath  Cardiovascular: No lower extremity edema, non tender, no erythema  Skin: Warm dry intact with no signs of infection or rash on extremities or on axial skeleton.  Abdomen: Soft nontender  Neuro: Cranial nerves II through XII are intact, neurovascularly intact in all extremities with 2+ DTRs and 2+ pulses.  Lymph: No lymphadenopathy of posterior or anterior cervical chain or axillae bilaterally.  Gait ambulates with an aid of a walker MSK:  Non  tender with full range of motion and good stability and symmetric strength and tone of shoulders, elbows, wrist, hip, and ankles bilaterally. Significant osteophytic changes of multiple joints Knee: Right Significant valgus deformity of the right knee mild instability Only tender over bruise area where injections been. Otherwise significant better. Range of motion lacks last 5 of extension and flexion Ligaments with solid consistent endpoints including ACL, PCL, LCL, MCL.  painful patellar compression. Patellar glide with moderate crepitus. Patellar and quadriceps tendons unremarkable. Hamstring and quadriceps strength is normal.  Contralateral knee has degenerative changes but is nontender to palpation Continued improvement noted.  procedure After informed written and verbal consent, patient was seated on exam table. Right knee was prepped with alcohol swab and utilizing anterolateral approach, patient's right knee space was injected with 15 mg/2.5 mL of Orthovisc (sodium hyaluronate) in a prefilled syringe was injected easily into the knee through a 22-gauge needle.   Impression and Recommendations:     This case required medical decision making of moderate complexity.

## 2015-07-01 ENCOUNTER — Other Ambulatory Visit: Payer: Self-pay | Admitting: Internal Medicine

## 2015-07-03 NOTE — Telephone Encounter (Signed)
Pt called back. I have her scheduled for 1/26. This appt may need to be changed. She has to follow up with the person who brings her here.

## 2015-07-03 NOTE — Telephone Encounter (Signed)
Left message asking patient to call back to schedule appt to establish care with new PCP, i have recd rx refill request from her pharm for metoprolol---i cannot refill rx until patient decides which PCP she would like to see.  If patient calls back, need to schedule appt to establish care with new PCP and let tamara know so that small supply of rx can be sent to pharm until patient is seen

## 2015-07-04 ENCOUNTER — Other Ambulatory Visit: Payer: Self-pay | Admitting: Internal Medicine

## 2015-07-04 NOTE — Telephone Encounter (Signed)
30 day supply of metoprolol sent to pharm

## 2015-07-04 NOTE — Telephone Encounter (Signed)
Pt informed

## 2015-07-19 ENCOUNTER — Ambulatory Visit (INDEPENDENT_AMBULATORY_CARE_PROVIDER_SITE_OTHER): Payer: Medicare Other | Admitting: Internal Medicine

## 2015-07-19 ENCOUNTER — Ambulatory Visit: Payer: Medicare Other | Admitting: Internal Medicine

## 2015-07-19 ENCOUNTER — Other Ambulatory Visit (INDEPENDENT_AMBULATORY_CARE_PROVIDER_SITE_OTHER): Payer: Medicare Other

## 2015-07-19 ENCOUNTER — Encounter: Payer: Self-pay | Admitting: Internal Medicine

## 2015-07-19 VITALS — BP 138/90 | HR 65 | Temp 98.1°F | Resp 12 | Ht 67.0 in | Wt 175.1 lb

## 2015-07-19 DIAGNOSIS — Z0000 Encounter for general adult medical examination without abnormal findings: Secondary | ICD-10-CM

## 2015-07-19 DIAGNOSIS — I10 Essential (primary) hypertension: Secondary | ICD-10-CM | POA: Diagnosis not present

## 2015-07-19 DIAGNOSIS — E039 Hypothyroidism, unspecified: Secondary | ICD-10-CM | POA: Diagnosis not present

## 2015-07-19 DIAGNOSIS — E785 Hyperlipidemia, unspecified: Secondary | ICD-10-CM

## 2015-07-19 LAB — COMPREHENSIVE METABOLIC PANEL
ALBUMIN: 3.9 g/dL (ref 3.5–5.2)
ALT: 19 U/L (ref 0–35)
AST: 27 U/L (ref 0–37)
Alkaline Phosphatase: 62 U/L (ref 39–117)
BILIRUBIN TOTAL: 0.6 mg/dL (ref 0.2–1.2)
BUN: 20 mg/dL (ref 6–23)
CALCIUM: 9.5 mg/dL (ref 8.4–10.5)
CHLORIDE: 103 meq/L (ref 96–112)
CO2: 29 mEq/L (ref 19–32)
CREATININE: 1.01 mg/dL (ref 0.40–1.20)
GFR: 55.62 mL/min — ABNORMAL LOW (ref >60.00)
Glucose, Bld: 120 mg/dL — ABNORMAL HIGH (ref 70–99)
Potassium: 4.4 mEq/L (ref 3.5–5.1)
SODIUM: 140 meq/L (ref 135–145)
Total Protein: 6.9 g/dL (ref 6.0–8.3)

## 2015-07-19 LAB — LIPID PANEL
Cholesterol: 156 mg/dL (ref 0–200)
HDL: 40.9 mg/dL (ref >39.00)
LDL CALC: 80 mg/dL (ref 0–99)
NonHDL: 115.45
TRIGLYCERIDES: 176 mg/dL — AB (ref 0.0–149.0)
Total CHOL/HDL Ratio: 4
VLDL: 35.2 mg/dL (ref 0.0–40.0)

## 2015-07-19 LAB — CBC
HCT: 37 % (ref 36.0–46.0)
Hemoglobin: 12.2 g/dL (ref 12.0–15.0)
MCHC: 33 g/dL (ref 30.0–36.0)
MCV: 97.9 fl (ref 78.0–100.0)
Platelets: 220 10*3/uL (ref 150.0–400.0)
RBC: 3.78 Mil/uL — AB (ref 3.87–5.11)
RDW: 13.2 % (ref 11.5–15.5)
WBC: 5.5 10*3/uL (ref 4.0–10.5)

## 2015-07-19 LAB — TSH: TSH: 1.17 u[IU]/mL (ref 0.35–4.50)

## 2015-07-19 NOTE — Progress Notes (Signed)
Pre visit review using our clinic review tool, if applicable. No additional management support is needed unless otherwise documented below in the visit note. 

## 2015-07-19 NOTE — Patient Instructions (Addendum)
We have sent in the referral to the dermatologist so you should be hearing about that.   That lump on your leg will likely shrink with time, you can use the pennsaid on it to see if that helps it shrink faster. You do not need to ice it anymore.   We are checking the blood work today and will call with the results.   Come back in 6 months if you are doing well or sooner if you have any problems or questions.   Fall Prevention in the Home  Falls can cause injuries and can affect people from all age groups. There are many simple things that you can do to make your home safe and to help prevent falls. WHAT CAN I DO ON THE OUTSIDE OF MY HOME?  Regularly repair the edges of walkways and driveways and fix any cracks.  Remove high doorway thresholds.  Trim any shrubbery on the main path into your home.  Use bright outdoor lighting.  Clear walkways of debris and clutter, including tools and rocks.  Regularly check that handrails are securely fastened and in good repair. Both sides of any steps should have handrails.  Install guardrails along the edges of any raised decks or porches.  Have leaves, snow, and ice cleared regularly.  Use sand or salt on walkways during winter months.  In the garage, clean up any spills right away, including grease or oil spills. WHAT CAN I DO IN THE BATHROOM?  Use night lights.  Install grab bars by the toilet and in the tub and shower. Do not use towel bars as grab bars.  Use non-skid mats or decals on the floor of the tub or shower.  If you need to sit down while you are in the shower, use a plastic, non-slip stool.[place] [employer]  Keep the floor dry. Immediately clean up any water that spills on the floor.  Remove soap buildup in the tub or shower on a regular basis.  Attach bath mats securely with double-sided non-slip rug tape.  Remove throw rugs and other tripping hazards from the floor. WHAT CAN I DO IN THE BEDROOM?  Use night lights.  Make sure that  a bedside light is easy to reach.  Do not use oversized bedding that drapes onto the floor.  Have a firm chair that has side arms to use for getting dressed.  Remove throw rugs and other tripping hazards from the floor. WHAT CAN I DO IN THE KITCHEN?   Clean up any spills right away.  Avoid walking on wet floors.  Place frequently used items in easy-to-reach places.  If you need to reach for something above you, use a sturdy step stool that has a grab bar.  Keep electrical cables out of the way.  Do not use floor polish or wax that makes floors slippery. If you have to use wax, make sure that it is non-skid floor wax.  Remove throw rugs and other tripping hazards from the floor. WHAT CAN I DO IN THE STAIRWAYS?  Do not leave any items on the stairs.  Make sure that there are handrails on both sides of the stairs. Fix handrails that are broken or loose. Make sure that handrails are as long as the stairways.  Check any carpeting to make sure that it is firmly attached to the stairs. Fix any carpet that is loose or worn.  Avoid having throw rugs at the top or bottom of stairways, or secure the rugs with carpet tape  to prevent them from moving.  Make sure that you have a light switch at the top of the stairs and the bottom of the stairs. If you do not have them, have them installed. WHAT ARE SOME OTHER FALL PREVENTION TIPS?  Wear closed-toe shoes that fit well and support your feet. Wear shoes that have rubber soles or low heels.  When you use a stepladder, make sure that it is completely opened and that the sides are firmly locked. Have someone hold the ladder while you are using it. Do not climb a closed stepladder.  Add color or contrast paint or tape to grab bars and handrails in your home. Place contrasting color strips on the first and last steps.  Use mobility aids as needed, such as canes, walkers, scooters, and crutches.  Turn on lights if it is dark. Replace any light  bulbs that burn out.  Set up furniture so that there are clear paths. Keep the furniture in the same spot.  Fix any uneven floor surfaces.  Choose a carpet design that does not hide the edge of steps of a stairway.  Be aware of any and all pets.  Review your medicines with your healthcare provider. Some medicines can cause dizziness or changes in blood pressure, which increase your risk of falling. Talk with your health care provider about other ways that you can decrease your risk of falls. This may include working with a physical therapist or trainer to improve your strength, balance, and endurance.   This information is not intended to replace advice given to you by your health care provider. Make sure you discuss any questions you have with your health care provider.   Document Released: 05/30/2002 Document Revised: 10/24/2014 Document Reviewed: 07/14/2014 Elsevier Interactive Patient Education [employer].

## 2015-07-20 ENCOUNTER — Encounter: Payer: Self-pay | Admitting: Internal Medicine

## 2015-07-20 DIAGNOSIS — Z0000 Encounter for general adult medical examination without abnormal findings: Secondary | ICD-10-CM | POA: Insufficient documentation

## 2015-07-20 NOTE — Assessment & Plan Note (Signed)
Checking TSH and adjust as needed. On synthroid 50 mcg daily now.

## 2015-07-20 NOTE — Progress Notes (Signed)
   Subjective:    Patient ID: Betty Chan, female    DOB: 10-31-1929, 82 y.o.   MRN: [id number]  HPI Here for medicare wellness, no new complaints. Please see A/P for status and treatment of chronic medical problems.   Diet: heart healthy Physical activity: sedentary Depression/mood screen: negative Hearing: intact to whispered voice Visual acuity: grossly normal, performs annual eye exam  ADLs: capable Fall risk: low Home safety: mediocre, has assistance for bathing Cognitive evaluation: intact to orientation, naming, recall and repetition, lives in ALF EOL planning: adv directives discussed, in place  I have personally reviewed and have noted 1. The patient's medical and social history - reviewed today no changes 2. Their use of alcohol, tobacco or illicit drugs 3. Their current medications and supplements 4. The patient's functional ability including ADL's, fall risks, home safety risks and hearing or visual impairment. 5. Diet and physical activities 6. Evidence for depression or mood disorders 7. Care team reviewed and updated (available in snapshot)  Review of Systems  Constitutional: Positive for activity change. Negative for fever, appetite change, fatigue and unexpected weight change.  Respiratory: Negative for cough, chest tightness, shortness of breath and wheezing.   Cardiovascular: Positive for leg swelling. Negative for chest pain and palpitations.  Gastrointestinal: Negative for nausea, abdominal pain, diarrhea, constipation and abdominal distention.  Musculoskeletal: Positive for arthralgias and gait problem. Negative for myalgias and back pain.  Skin: Negative.   Neurological: Negative.   Psychiatric/Behavioral: Negative.       Objective:   Physical Exam  Constitutional: She is oriented to person, place, and time. She appears well-developed and well-nourished.  HENT:  Head: Normocephalic and atraumatic.  Eyes: EOM are normal.  Neck: Normal range of  motion.  Cardiovascular: Normal rate and regular rhythm.   Artifical valve click  Pulmonary/Chest: Effort normal and breath sounds normal.  Abdominal: Soft. She exhibits no distension. There is no tenderness. There is no rebound.  Musculoskeletal: She exhibits edema.  1-2+ edema bilateral to the mid shin  Neurological: She is alert and oriented to person, place, and time.  Skin: Skin is warm and dry.   Filed Vitals:   07/19/15 1339  BP: 138/90  Pulse: 65  Temp: 98.1 F (36.7 C)  TempSrc: Oral  Resp: 12  Height: 5\' 7"  (1.702 m)  Weight: 175 lb 1.3 oz (79.416 kg)  SpO2: 96%      Assessment & Plan:

## 2015-07-20 NOTE — Assessment & Plan Note (Signed)
Checking labs today and adjust as needed. Aged out of colonoscopy. Immunizations up to date except shingles which she declines today. Counseled on home safety and energy management. 10 year screening recommendations given to her at visit.

## 2015-07-20 NOTE — Assessment & Plan Note (Signed)
Checking lipid panel and adjust as needed. On lipitor 20 mg daily without side effects.

## 2015-07-20 NOTE — Assessment & Plan Note (Signed)
BP at goal on lisinopril and metoprolol. Checking CMP and adjust as needed.

## 2015-08-22 ENCOUNTER — Other Ambulatory Visit: Payer: Self-pay | Admitting: Internal Medicine

## 2015-09-17 ENCOUNTER — Other Ambulatory Visit: Payer: Self-pay | Admitting: Internal Medicine

## 2015-10-15 ENCOUNTER — Telehealth: Payer: Self-pay | Admitting: Family Medicine

## 2015-10-15 NOTE — Telephone Encounter (Signed)
lmovm advising daughter that dr Tamala Julian is booked tomorrow & pt will need to keep appt on 4.26.17.

## 2015-10-15 NOTE — Telephone Encounter (Signed)
I'd rather her keep the 26th.   If too much pain or need daughter to take her then double book 11 tomorrow.  If she can wait a day that would be great

## 2015-10-15 NOTE — Telephone Encounter (Signed)
Is requesting mother to be worked in [employer] for injection.  Has appt 26th

## 2015-10-17 ENCOUNTER — Other Ambulatory Visit: Payer: Self-pay | Admitting: *Deleted

## 2015-10-17 ENCOUNTER — Encounter: Payer: Self-pay | Admitting: Family Medicine

## 2015-10-17 ENCOUNTER — Ambulatory Visit (INDEPENDENT_AMBULATORY_CARE_PROVIDER_SITE_OTHER): Payer: Medicare Other | Admitting: Family Medicine

## 2015-10-17 VITALS — BP 104/72 | HR 78

## 2015-10-17 DIAGNOSIS — R2689 Other abnormalities of gait and mobility: Secondary | ICD-10-CM

## 2015-10-17 DIAGNOSIS — M1990 Unspecified osteoarthritis, unspecified site: Secondary | ICD-10-CM | POA: Diagnosis not present

## 2015-10-17 DIAGNOSIS — M1991 Primary osteoarthritis, unspecified site: Secondary | ICD-10-CM

## 2015-10-17 DIAGNOSIS — R279 Unspecified lack of coordination: Secondary | ICD-10-CM

## 2015-10-17 NOTE — Progress Notes (Signed)
  [facility] [address] [place], [place] [address] Phone: [phone] Subjective:     CC: Right knee painfollow-up.   [id number] Betty Chan is a 82 y.o. female coming in with complaint of right knee pain. Patient has been having right knee pain for multiple years. Patient is been diagnosed with severe osteophytic changes of the knee previously.patient did respond fairly well to viscous supple mentation previously. Patient though finish her last series 3 months ago. Started having pain again. States that it is affecting the way she walks. Having difficulty even doing daily activities. Waking her up at night..    Past medical history, social, surgical and family history all reviewed in electronic medical record.   Review of Systems: No headache, visual changes, nausea, vomiting, diarrhea, constipation, dizziness, abdominal pain, skin rash, fevers, chills, night sweats, weight loss, swollen lymph nodes, body aches, joint swelling, muscle aches, chest pain, shortness of breath, mood changes.   Objective Blood pressure 104/72, pulse 78, SpO2 93 %.  General: No apparent distress alert and oriented x3 mood and affect normal, dressed appropriately.  HEENT: Pupils equal, extraocular movements intact  Respiratory: Patient's speak in full sentences and does not appear short of breath  Cardiovascular: No lower extremity edema, non tender, no erythema  Skin: Warm dry intact with no signs of infection or rash on extremities or on axial skeleton.  Abdomen: Soft nontender  Neuro: Cranial nerves II through XII are intact, neurovascularly intact in all extremities with 2+ DTRs and 2+ pulses.  Lymph: No lymphadenopathy of posterior or anterior cervical chain or axillae bilaterally.  Gait ambulates with an aid of a walker MSK:  Non tender with full range of motion and good stability and symmetric strength and tone of shoulders, elbows, wrist, hip, and ankles  bilaterally. Significant osteophytic changes of multiple joints Knee: Right Significant valgus deformity of the right knee mild instability Really tender to palpation over the medial joint line Range of motion lacks last 5 of extension and flexion Ligaments with solid consistent endpoints including ACL, PCL, LCL, MCL.  painful patellar compression. Patellar glide with moderate crepitus. Patellar and quadriceps tendons unremarkable. Hamstring and quadriceps strength is normal.  Contralateral knee has degenerative changes but is nontender to palpation Worsening exams and previous exam  procedure After informed written and verbal consent, patient was seated on exam table. Right knee was prepped with alcohol swab and utilizing anterolateral approach, patient's right knee space was injected with 4:1  marcaine 0.5%: Kenalog 40mg /dL. Patient tolerated the procedure well without immediate complications..   Impression and Recommendations:     This case required medical decision making of moderate complexity.

## 2015-10-17 NOTE — Patient Instructions (Signed)
I am sorry you are hurting again.  We still have 3 months until we can repeat the series of injections.  We did steroid injections again today  Ice as much as you want COnsider the brace with a lot of activity  Wear good shoes. Use walker with walking In 6 weeks if pain is worse I will repeat steroid injections again.

## 2015-10-17 NOTE — Assessment & Plan Note (Signed)
Patient was given a steroid injection again today. We discussed icing regimen and home exercises. We discussed continuing to walk with a walker. Patient still declines wearing a brace on a regular basis. We discussed which activities to do in which ones to avoid. Patient will come back and see me again in 6 weeks. At that point if doing better we may be able to start viscous supple mentation. Would need to be early June.

## 2015-10-17 NOTE — Progress Notes (Signed)
Pre visit review using our clinic review tool, if applicable. No additional management support is needed unless otherwise documented below in the visit note. 

## 2015-11-28 ENCOUNTER — Ambulatory Visit (INDEPENDENT_AMBULATORY_CARE_PROVIDER_SITE_OTHER): Payer: Medicare Other | Admitting: Family Medicine

## 2015-11-28 ENCOUNTER — Encounter: Payer: Self-pay | Admitting: Family Medicine

## 2015-11-28 VITALS — BP 116/76 | HR 82 | Wt 163.0 lb

## 2015-11-28 DIAGNOSIS — M1991 Primary osteoarthritis, unspecified site: Secondary | ICD-10-CM

## 2015-11-28 DIAGNOSIS — R569 Unspecified convulsions: Secondary | ICD-10-CM | POA: Diagnosis not present

## 2015-11-28 DIAGNOSIS — M1990 Unspecified osteoarthritis, unspecified site: Secondary | ICD-10-CM

## 2015-11-28 NOTE — Patient Instructions (Signed)
Good to see you Ice is your friend I am sorry the knee continues to hurt Lets say 5 weeks and we will do the series again.

## 2015-11-28 NOTE — Assessment & Plan Note (Signed)
Patient given another injection and tolerated the procedure well. We discussed icing regimen. Discussed bracing which patient declined and not using on areolar basis. Patient will come back and see me again in 5-6 weeks. At that time she would be a candidate for viscous supplementation series #3.

## 2015-11-28 NOTE — Assessment & Plan Note (Signed)
History of seizures. Patient has been referred to neurology. Patient wanted to see someone specific. Edward hill MD> we will refer him per patient's daughters information.

## 2015-11-28 NOTE — Progress Notes (Signed)
  [name] [address] [place], [place] [address] Phone: [phone] Subjective:     CC: Right knee painfollow-up.   [id number] Betty Chan is a 82 y.o. female coming in with complaint of right knee pain. Patient has been having right knee pain for multiple years.   Severe arthritis. Failed viscous supple mentation and is due for another series in one month. Patient's intent steroid injection within the last 10 weeks but is having worsening symptoms again. Having increasing instability. Continues to work with a walker for ambulation.  Patient is accompanied with daughter who is concern for her having more of a central issue causing some difficulty with her back in memory. Once referral to neurology.    Past medical history, social, surgical and family history all reviewed in electronic medical record.   Review of Systems: No headache, visual changes, nausea, vomiting, diarrhea, constipation, dizziness, abdominal pain, skin rash, fevers, chills, night sweats, weight loss, swollen lymph nodes, body aches, joint swelling, muscle aches, chest pain, shortness of breath, mood changes.   Objective Blood pressure 116/76, pulse 82, weight 163 lb (73.936 kg).  General: No apparent distress alert and oriented x3 mood and affect normal, dressed appropriately.  HEENT: Pupils equal, extraocular movements intact  Respiratory: Patient's speak in full sentences and does not appear short of breath  Cardiovascular: No lower extremity edema, non tender, no erythema  Skin: Warm dry intact with no signs of infection or rash on extremities or on axial skeleton.  Abdomen: Soft nontender  Neuro: Cranial nerves II through XII are intact, neurovascularly intact in all extremities with 2+ DTRs and 2+ pulses.  Lymph: No lymphadenopathy of posterior or anterior cervical chain or axillae bilaterally.  Gait ambulates with an aid of a walker MSK:  Non tender with full range of  motion and good stability and symmetric strength and tone of shoulders, elbows, wrist, hip, and ankles bilaterally. Significant osteophytic changes of multiple joints Knee: Right Significant valgus deformity of the right knee mild instability Continued tenderness over the medial joint line Range of motion lacks last 5 of extension and flexion Ligaments with solid consistent endpoints including ACL, PCL, LCL, MCL.  painful patellar compression. Patellar glide with moderate crepitus. Patellar and quadriceps tendons unremarkable. Hamstring and quadriceps strength is normal.  Contralateral knee has degenerative changes but is nontender to palpation Worsening exams and previous exam  procedure After informed written and verbal consent, patient was seated on exam table. Right knee was prepped with alcohol swab and utilizing anterolateral approach, patient's right knee space was injected with 4:1  marcaine 0.5%: Kenalog 40mg /dL. Patient tolerated the procedure well without immediate complications..   Impression and Recommendations:     This case required medical decision making of moderate complexity.

## 2015-12-17 ENCOUNTER — Other Ambulatory Visit: Payer: Self-pay | Admitting: Internal Medicine

## 2016-01-01 ENCOUNTER — Other Ambulatory Visit: Payer: Self-pay

## 2016-01-01 ENCOUNTER — Telehealth: Payer: Self-pay | Admitting: Family Medicine

## 2016-01-01 DIAGNOSIS — R569 Unspecified convulsions: Secondary | ICD-10-CM

## 2016-01-01 NOTE — Telephone Encounter (Signed)
Referral placed to Dr. Lawrence Marseilles [facility] Neurology-)

## 2016-01-01 NOTE — Telephone Encounter (Signed)
Asked about purpose for appointment tomorrow and was wondering about neurology referral (Dr. In Beatriz Chancellor recommendation from their PT)-daughter can't remember the name of the physician-they have not heard regarding consult.

## 2016-01-01 NOTE — Telephone Encounter (Signed)
Pt's daughter, [name], request to speak to the assistant concern about Ms. Zwahlen appt for tomorrow. Please call her back

## 2016-01-02 ENCOUNTER — Ambulatory Visit: Payer: Medicare Other | Admitting: Family Medicine

## 2016-01-16 NOTE — Assessment & Plan Note (Addendum)
Patient started on monoviscseries again today. Patient did have fairly good results last time. Hopefully she will continue. We'll see patient back again in 1 week for second in a series of 4 injections.

## 2016-01-16 NOTE — Progress Notes (Signed)
[facility] [address] [place], [place] [address] Phone: [phone] Subjective:     CC: Right knee painfollow-up.   [id number]  Betty Chan is a 82 y.o. female coming in with complaint of right knee pain. Patient has been having right knee pain for multiple years.   Severe arthritis. Patient does not want any surgical intervention. Patient is now greater than 6 months out from previous viscous supplementation would like another round. Continues to have significant difficulty. Affecting daily activities and can wake her up at night.failed another steroid injection and only had a couple days of response.  Past Medical History:  Diagnosis Date  . Aortic stenosis    s/p TAVR 08/2013 -follows at [facility] cards [facility])  . Basal cell carcinoma   . Cancer of right breast [facility]) 2004   lumpectomy  . Dyslipidemia   . GERD (gastroesophageal reflux disease)   . Heart murmur   . Hypertension   . Hypothyroid   . OA (osteoarthritis) of knee    "right" (11/24/2014)  . Osteopenia 02/13/2014   DEXA 01/2014: -2.2  . PHN (postherpetic neuralgia)    L anterior thigh 2007, recurrent 04/2013  . Seizure ([place]) 2007   single event, off Keppra since 11/2013; "not sure if it was a TIA or seizure"  . Squamous cell cancer of skin of forearm   . TIA (transient ischemic attack) 2007   "not sure if it was a TIA or seizure"   Past Surgical History:  Procedure Laterality Date  . BASAL CELL CARCINOMA EXCISION     sternum  . BREAST BIOPSY Right 2004  . BREAST LUMPECTOMY Right 08/2002 X 2  . CARDIAC CATHETERIZATION  "several"  . CARDIAC VALVE REPLACEMENT    . CATARACT EXTRACTION W/ INTRAOCULAR LENS  IMPLANT, BILATERAL Bilateral 03/2014-07/2014  . SQUAMOUS CELL CARCINOMA EXCISION Left 06/2014   'forearm"  . TONSILLECTOMY    . TRANSCATHETER AORTIC VALVE REPLACEMENT, TRANSFEMORAL  09/06/13   [facility]   Social History  Substance Use Topics  . Smoking status: Never Smoker  .  Smokeless tobacco: Never Used     Comment: widowed, lives in [facility]   . Alcohol use Yes     Comment: 11/24/2014 "1/2 glass of wine q 2-3 months"   Allergies  Allergen Reactions  . Amoxicillin Rash  . Penicillins Rash   Family History  Problem Relation Age of Onset  . Prostate cancer Father   . Breast cancer Sister      Past medical history, social, surgical and family history all reviewed in electronic medical record.   Review of Systems: No headache, visual changes, nausea, vomiting, diarrhea, constipation, dizziness, abdominal pain, skin rash, fevers, chills, night sweats, weight loss, swollen lymph nodes, body aches, joint swelling, muscle aches, chest pain, shortness of breath, mood changes.   Objective  There were no vitals taken for this visit.  General: No apparent distress alert and oriented x3 mood and affect normal, dressed appropriately.  HEENT: Pupils equal, extraocular movements intact  Respiratory: Patient's speak in full sentences and does not appear short of breath  Cardiovascular: No lower extremity edema, non tender, no erythema  Skin: Warm dry intact with no signs of infection or rash on extremities or on axial skeleton.  Abdomen: Soft nontender  Neuro: Cranial nerves II through XII are intact, neurovascularly intact in all extremities with 2+ DTRs and 2+ pulses.  Lymph: No lymphadenopathy of posterior or anterior cervical chain or axillae bilaterally.  Gait  ambulates with an aid of a walker MSK:  Non tender with full range of motion and good stability and symmetric strength and tone of shoulders, elbows, wrist, hip, and ankles bilaterally. Significant osteophytic changes of multiple joints Knee: Right Significant valgus deformity of the right knee mild instability Continued tenderness over the medial joint line Range of motion lacks last 5 of extension and flexion Ligaments with solid consistent endpoints including ACL, PCL, LCL, MCL.  painful patellar  compression. Patellar glide with moderate crepitus. Patellar and quadriceps tendons unremarkable. Hamstring and quadriceps strength is normal.  Contralateral knee has degenerative changes but is nontender to palpation Worsening exams and previous exam  procedure After informed written and verbal consent, patient was seated on exam table. Right knee was prepped with alcohol swab and utilizing anterolateral approach, patient's right knee space was injected with 25 mg/1 mL of Monovisc (sodium hyaluronate) in a prefilled syringe was injected easily into the knee through a 22-gauge needle.. Patient tolerated the procedure well without immediate complications..   Impression and Recommendations:     This case required medical decision making of moderate complexity.

## 2016-01-17 ENCOUNTER — Ambulatory Visit (INDEPENDENT_AMBULATORY_CARE_PROVIDER_SITE_OTHER): Payer: Medicare Other | Admitting: Internal Medicine

## 2016-01-17 ENCOUNTER — Other Ambulatory Visit (INDEPENDENT_AMBULATORY_CARE_PROVIDER_SITE_OTHER): Payer: Medicare Other

## 2016-01-17 ENCOUNTER — Ambulatory Visit (INDEPENDENT_AMBULATORY_CARE_PROVIDER_SITE_OTHER): Payer: Medicare Other | Admitting: Family Medicine

## 2016-01-17 ENCOUNTER — Encounter: Payer: Self-pay | Admitting: Internal Medicine

## 2016-01-17 VITALS — BP 134/80 | HR 74 | Ht 67.0 in | Wt 161.0 lb

## 2016-01-17 DIAGNOSIS — N3281 Overactive bladder: Secondary | ICD-10-CM | POA: Diagnosis not present

## 2016-01-17 DIAGNOSIS — E038 Other specified hypothyroidism: Secondary | ICD-10-CM

## 2016-01-17 DIAGNOSIS — R2681 Unsteadiness on feet: Secondary | ICD-10-CM | POA: Diagnosis not present

## 2016-01-17 DIAGNOSIS — M1991 Primary osteoarthritis, unspecified site: Secondary | ICD-10-CM

## 2016-01-17 DIAGNOSIS — M1990 Unspecified osteoarthritis, unspecified site: Secondary | ICD-10-CM | POA: Diagnosis not present

## 2016-01-17 LAB — COMPREHENSIVE METABOLIC PANEL
ALT: 17 U/L (ref 0–35)
AST: 25 U/L (ref 0–37)
Albumin: 4 g/dL (ref 3.5–5.2)
Alkaline Phosphatase: 58 U/L (ref 39–117)
BUN: 27 mg/dL — ABNORMAL HIGH (ref 6–23)
CO2: 28 mEq/L (ref 19–32)
CREATININE: 1.04 mg/dL (ref 0.40–1.20)
Calcium: 9.6 mg/dL (ref 8.4–10.5)
Chloride: 104 mEq/L (ref 96–112)
GFR: 53.71 mL/min — ABNORMAL LOW (ref >60.00)
Glucose, Bld: 102 mg/dL — ABNORMAL HIGH (ref 70–99)
Potassium: 4.1 mEq/L (ref 3.5–5.1)
SODIUM: 139 meq/L (ref 135–145)
Total Bilirubin: 0.6 mg/dL (ref 0.2–1.2)
Total Protein: 7 g/dL (ref 6.0–8.3)

## 2016-01-17 LAB — CBC
HCT: 36 % (ref 36.0–46.0)
Hemoglobin: 12.2 g/dL (ref 12.0–15.0)
MCHC: 33.8 g/dL (ref 30.0–36.0)
MCV: 96.8 fl (ref 78.0–100.0)
Platelets: 231 10*3/uL (ref 150.0–400.0)
RBC: 3.73 Mil/uL — ABNORMAL LOW (ref 3.87–5.11)
RDW: 13.3 % (ref 11.5–15.5)
WBC: 6.9 10*3/uL (ref 4.0–10.5)

## 2016-01-17 LAB — T4, FREE: Free T4: 1.2 ng/dL (ref 0.60–1.60)

## 2016-01-17 LAB — TSH: TSH: 2.13 u[IU]/mL (ref 0.35–4.50)

## 2016-01-17 MED ORDER — OXYBUTYNIN CHLORIDE 5 MG PO TABS: 10.0000 mg | ORAL_TABLET | Freq: Two times a day (BID) | ORAL | 6 refills | Status: DC

## 2016-01-17 NOTE — Progress Notes (Signed)
   Subjective:    Patient ID: Betty Chan, female    DOB: 05-04-1930, 82 y.o.   MRN: [id number]  HPI The patient is an 82 YO female coming in for follow up of her gait problems. She used to see neurology and stopped once she was stable off her keppra without seizures. She has had progressive decline in her gait over the last 2 years. She is now not able to pick up her feet well and shuffling gait. She told her previous neurologist about this and they did not evaluate her symptoms. She would like to see Estelle June, [place] [facility]. She is also having more problems with urinary incontinence in the night time. She is having accidents even though she is using bedside commode. No falls at night time. Probably 1-2 times per night having to go and not making it. Used to be fine with her oxybutynin and still does well during the daytime.   Review of Systems  Constitutional: Positive for activity change. Negative for appetite change, fatigue, fever and unexpected weight change.  Respiratory: Negative for cough, chest tightness, shortness of breath and wheezing.   Cardiovascular: Positive for leg swelling. Negative for chest pain and palpitations.  Gastrointestinal: Negative for abdominal distention, abdominal pain, constipation, diarrhea and nausea.  Musculoskeletal: Positive for arthralgias and gait problem. Negative for back pain and myalgias.  Skin: Negative.   Psychiatric/Behavioral: Negative.       Objective:   Physical Exam  Constitutional: She is oriented to person, place, and time. She appears well-developed and well-nourished.  HENT:  Head: Normocephalic and atraumatic.  Eyes: EOM are normal.  Neck: Normal range of motion.  Cardiovascular: Normal rate and regular rhythm.   Artifical valve click  Pulmonary/Chest: Effort normal and breath sounds normal.  Abdominal: Soft. She exhibits no distension. There is no tenderness. There is no rebound.  Musculoskeletal: She exhibits  edema.  Trace edema bilaterally.   Neurological: She is alert and oriented to person, place, and time.  Using wheeled walker for ambulation and gait is shuffling and she is not able to pick up her feet well and toe walking.   Skin: Skin is warm and dry.   Vitals:   01/17/16 1327  BP: 134/80  Pulse: 74  Weight: 161 lb (73 kg)  Height: 5\' 7"  (1.702 m)      Assessment & Plan:

## 2016-01-17 NOTE — Patient Instructions (Addendum)
Good to see you  We tried monovisc this time and I hope it helps Keep trucking along and continue everything else you are doing.  I wish I had a magic wond.

## 2016-01-17 NOTE — Assessment & Plan Note (Signed)
Checking TSH and free T4 and adjust her synthroid 50 mcg daily as needed.  

## 2016-01-17 NOTE — Assessment & Plan Note (Signed)
Referral to neurology Dr. Lawrence Marseilles jr to evaluate her for neurological cause of her decline in gait and several other symptoms. Checking thyroid, CBC, CMP for reversible cause today.

## 2016-01-17 NOTE — Patient Instructions (Signed)
We will send a new prescription for the bladder medicine oxybutynin. Take 1 pill in the morning and 2 pills in the evening.   Stop taking the atorvastatin (lipitor) for 2 months to see if it helps with your walking.   We have placed the referral for the neurologist to evaluate you.

## 2016-01-17 NOTE — Assessment & Plan Note (Signed)
Rx for oxybutynin 10 mg BID for symptoms. Advised that she increase and take 5 mg qam and 10 mg qpm. Can further increase to 10 mg BID if needed for daytime increase in symptoms. No signs of infection today so no U/A done.

## 2016-01-22 ENCOUNTER — Telehealth: Payer: Self-pay

## 2016-01-22 NOTE — Telephone Encounter (Signed)
We would like her to get neurology consult done first to see if there is cause for it. She would need to see PT for evaluation for electric wheel chair and would need separate 30 minute visit with me for this. Can she use a manual wheelchair?

## 2016-01-22 NOTE — Telephone Encounter (Signed)
Betty Chan with home health is requesting a power chair for the patient. She states the patient is really needing it at this point. She ask that you call there daughter back  This once you have information. Thank you.

## 2016-01-22 NOTE — Telephone Encounter (Signed)
Will patient need an office visit?

## 2016-01-23 NOTE — Telephone Encounter (Signed)
Patient aware and will schedule office visit after neuro appt and PT eval.

## 2016-03-16 ENCOUNTER — Other Ambulatory Visit: Payer: Self-pay | Admitting: Internal Medicine

## 2016-03-27 ENCOUNTER — Telehealth: Payer: Self-pay | Admitting: Internal Medicine

## 2016-03-27 NOTE — Telephone Encounter (Signed)
Would like to know if it was time for patient to schedule Cortizone injection.

## 2016-03-28 NOTE — Telephone Encounter (Signed)
Patients daughter called again about. She really wants Dr. Tamala Julian to work her in on Monday. Please follow up. Thank you.

## 2016-03-31 ENCOUNTER — Telehealth: Payer: Self-pay | Admitting: Internal Medicine

## 2016-03-31 ENCOUNTER — Ambulatory Visit (INDEPENDENT_AMBULATORY_CARE_PROVIDER_SITE_OTHER): Payer: Medicare Other | Admitting: Family Medicine

## 2016-03-31 VITALS — BP 116/70 | Wt 159.6 lb

## 2016-03-31 DIAGNOSIS — Z23 Encounter for immunization: Secondary | ICD-10-CM | POA: Diagnosis not present

## 2016-03-31 DIAGNOSIS — I872 Venous insufficiency (chronic) (peripheral): Secondary | ICD-10-CM | POA: Diagnosis not present

## 2016-03-31 DIAGNOSIS — R490 Dysphonia: Secondary | ICD-10-CM

## 2016-03-31 NOTE — Assessment & Plan Note (Signed)
Patient given injection today. We discussed icing regimen and home exercises. We discussed which activities to do in which ones to potentially avoid. Patient will continue to be active. Follow-up again with me in 8-10 weeks for another injection. Worsening symptom.

## 2016-03-31 NOTE — Telephone Encounter (Signed)
Pt is coming in today at 1:45pm.

## 2016-03-31 NOTE — Progress Notes (Signed)
[facility] [address] [place], [place] [address] Phone: [phone] Subjective:     CC: Right knee painfollow-up.   [id number]  Betty Chan is a 82 y.o. female coming in with complaint of right knee pain. Patient has been having right knee pain for multiple years.   Severe arthritis. Patient does not want any surgical intervention. Patient is having worsening pain. Failed viscous supplementation 10 weeks ago. Having worsening pain and cannot even ambulate with the pain in the knee.  Past Medical History:  Diagnosis Date  . Aortic stenosis    s/p TAVR 08/2013 -follows at [facility] cards Mina [facility])  . Basal cell carcinoma   . Cancer of right breast [facility]) 2004   lumpectomy  . Dyslipidemia   . GERD (gastroesophageal reflux disease)   . Heart murmur   . Hypertension   . Hypothyroid   . OA (osteoarthritis) of knee    "right" (11/24/2014)  . Osteopenia 02/13/2014   DEXA 01/2014: -2.2  . PHN (postherpetic neuralgia)    L anterior thigh 2007, recurrent 04/2013  . Seizure ([place]) 2007   single event, off Keppra since 11/2013; "not sure if it was a TIA or seizure"  . Squamous cell cancer of skin of forearm   . TIA (transient ischemic attack) 2007   "not sure if it was a TIA or seizure"   Past Surgical History:  Procedure Laterality Date  . BASAL CELL CARCINOMA EXCISION     sternum  . BREAST BIOPSY Right 2004  . BREAST LUMPECTOMY Right 08/2002 X 2  . CARDIAC CATHETERIZATION  "several"  . CARDIAC VALVE REPLACEMENT    . CATARACT EXTRACTION W/ INTRAOCULAR LENS  IMPLANT, BILATERAL Bilateral 03/2014-07/2014  . SQUAMOUS CELL CARCINOMA EXCISION Left 06/2014   'forearm"  . TONSILLECTOMY    . TRANSCATHETER AORTIC VALVE REPLACEMENT, TRANSFEMORAL  09/06/13   [facility]   Social History  Substance Use Topics  . Smoking status: Never Smoker  . Smokeless tobacco: Never Used     Comment: widowed, lives in [place] living   . Alcohol use Yes     Comment: 11/24/2014 "1/2 glass  of wine q 2-3 months"   Allergies  Allergen Reactions  . Amoxicillin Rash  . Penicillins Rash   Family History  Problem Relation Age of Onset  . Prostate cancer Father   . Breast cancer Sister      Past medical history, social, surgical and family history all reviewed in electronic medical record.   Review of Systems: No headache, visual changes, nausea, vomiting, diarrhea, constipation, dizziness, abdominal pain, skin rash, fevers, chills, night sweats, weight loss, swollen lymph nodes, body aches, joint swelling, muscle aches, chest pain, shortness of breath, mood changes.   Objective  Blood pressure 116/70, weight 159 lb 9.6 oz (72.4 kg).  General: No apparent distress alert and oriented x3 mood and affect normal, dressed appropriately.  HEENT: Pupils equal, extraocular movements intact  Respiratory: Patient's speak in full sentences and does not appear short of breath  Cardiovascular: No lower extremity edema, non tender, no erythema  Skin: Warm dry intact with no signs of infection or rash on extremities or on axial skeleton.  Abdomen: Soft nontender  Neuro: Cranial nerves II through XII are intact, neurovascularly intact in all extremities with 2+ DTRs and 2+ pulses.  Lymph: No lymphadenopathy of posterior or anterior cervical chain or axillae bilaterally.  Gait non ambulatory  MSK:  Non tender with full range of motion and good stability and  symmetric strength and tone of shoulders, elbows, wrist, hip, and ankles bilaterally. Significant osteophytic changes of multiple joints Knee: Right Significant valgus deformity of the right knee mild instability Continued tenderness over the medial joint line Lacking the last 10 of flexion and the last 5 of extension. Ligaments with solid consistent endpoints including ACL, PCL, LCL, MCL.  painful patellar compression. Patellar  severe crepitus. Patellar and quadriceps tendons unremarkable. Hamstring and quadriceps strength is  normal.  Contralateral knee has degenerative changes but is nontender to palpation Worsening exams and previous exam  procedure After informed written and verbal consent, patient was seated on exam table. Right knee was prepped with alcohol swab and utilizing anterolateral approach, patient's right knee space was injected with 4:1  marcaine 0.5%: Kenalog 40mg /dL. Patient tolerated the procedure well without immediate complications...   Impression and Recommendations:     This case required medical decision making of moderate complexity.

## 2016-03-31 NOTE — Telephone Encounter (Signed)
Patient requesting a referral for a ENT because of her voice. She had her home health nurse call and make the request. They would like you to follow up with daughter [name]. Thank you.

## 2016-03-31 NOTE — Patient Instructions (Signed)
Good to see you  You know the drill.  If I had a magic wand I would use it See me again in 10 week.

## 2016-04-01 NOTE — Telephone Encounter (Signed)
Order placed

## 2016-05-21 NOTE — Progress Notes (Signed)
[name] [address] [place], [place] [address] Phone: [phone] Subjective:     CC: Right knee painfollow-up.   [id number]  Betty Chan is a 82 y.o. female coming in with complaint of right knee pain. Patient has been having right knee pain for multiple years.   Severe arthritis. Patient does not want any surgical intervention. Patient continues to have an stability of the knee. Has had an injection 9 weeks ago. Pain is worsening again. Patient is traveling out of town and wants her pain level to be some less.  Past Medical History:  Diagnosis Date  . Aortic stenosis    s/p TAVR 08/2013 -follows at [facility] cards Mina Marble)  . Basal cell carcinoma   . Cancer of right breast [facility]) 2004   lumpectomy  . Dyslipidemia   . GERD (gastroesophageal reflux disease)   . Heart murmur   . Hypertension   . Hypothyroid   . OA (osteoarthritis) of knee    "right" (11/24/2014)  . Osteopenia 02/13/2014   DEXA 01/2014: -2.2  . PHN (postherpetic neuralgia)    L anterior thigh 2007, recurrent 04/2013  . Seizure ([place]) 2007   single event, off Keppra since 11/2013; "not sure if it was a TIA or seizure"  . Squamous cell cancer of skin of forearm   . TIA (transient ischemic attack) 2007   "not sure if it was a TIA or seizure"   Past Surgical History:  Procedure Laterality Date  . BASAL CELL CARCINOMA EXCISION     sternum  . BREAST BIOPSY Right 2004  . BREAST LUMPECTOMY Right 08/2002 X 2  . CARDIAC CATHETERIZATION  "several"  . CARDIAC VALVE REPLACEMENT    . CATARACT EXTRACTION W/ INTRAOCULAR LENS  IMPLANT, BILATERAL Bilateral 03/2014-07/2014  . SQUAMOUS CELL CARCINOMA EXCISION Left 06/2014   'forearm"  . TONSILLECTOMY    . TRANSCATHETER AORTIC VALVE REPLACEMENT, TRANSFEMORAL  09/06/13   [facility]   Social History  Substance Use Topics  . Smoking status: Never Smoker  . Smokeless tobacco: Never Used     Comment: widowed, lives in [facility] living   . Alcohol use Yes       Comment: 11/24/2014 "1/2 glass of wine q 2-3 months"   Allergies  Allergen Reactions  . Amoxicillin Rash  . Penicillins Rash   Family History  Problem Relation Age of Onset  . Prostate cancer Father   . Breast cancer Sister      Past medical history, social, surgical and family history all reviewed in electronic medical record.   Review of Systems: No  visual changes, nausea, vomiting, diarrhea, constipation, dizziness, abdominal pain, skin rash, fevers, chills, night sweats, weight loss, swollen lymph nodes, chest pain, shortness of breath, mood changes.   Objective  Blood pressure 122/64, pulse (!) 104, weight 161 lb (73 kg), SpO2 99 %.  Systems examined below as of 05/22/16 General: NAD A&O x3 mood, affect normal  HEENT: Pupils equal, extraocular movements intact no nystagmus Respiratory: not short of breath at rest or with speaking Cardiovascular: Trace lower extremity edema, non tender Skin: Warm dry intact with no signs of infection or rash on extremities or on axial skeleton. Abdomen: Soft nontender, no masses Neuro: Cranial nerves  intact, neurovascularly intact in all extremities with 2+ DTRs and 2+ pulses. Lymph: No lymphadenopathy appreciated today  Gait non ambulatory  MSK:  Non tender with full range of motion and good stability and symmetric strength and tone of shoulders, elbows, wrist,  hip, and ankles bilaterally. Significant arthritic changes of multiple joints Knee: Right Significant valgus deformity of the right knee mild instability Continued tenderness over the medial joint line Lacking the last 10 of flexion and the last 5 of extension. Instability with valgus force.  painful patellar compression. Patellar  severe crepitus. Patellar and quadriceps tendons unremarkable. Hamstring and quadriceps strength is normal.  Contralateral knee has degenerative changes but is nontender to palpation Worsening exams and previous exam  procedure After informed  written and verbal consent, patient was seated on exam table. Right knee was prepped with alcohol swab and utilizing anterolateral approach, patient's right knee space was injected with 4:1  marcaine 0.5%: Kenalog 40mg /dL. Patient tolerated the procedure well without immediate complications...   Impression and Recommendations:     This case required medical decision making of moderate complexity.

## 2016-05-22 ENCOUNTER — Encounter: Payer: Self-pay | Admitting: Family Medicine

## 2016-05-22 ENCOUNTER — Ambulatory Visit (INDEPENDENT_AMBULATORY_CARE_PROVIDER_SITE_OTHER): Payer: Medicare Other | Admitting: Family Medicine

## 2016-05-22 DIAGNOSIS — M1991 Primary osteoarthritis, unspecified site: Secondary | ICD-10-CM

## 2016-05-22 DIAGNOSIS — R2681 Unsteadiness on feet: Secondary | ICD-10-CM | POA: Diagnosis not present

## 2016-05-22 NOTE — Assessment & Plan Note (Addendum)
Worsening symptoms. Repeat injection today. Tolerated the procedure well. We discussed wearing the brace basis. We discussed icing regimen. Patient will come back again in 10 weeks. Patient could be a candidate for viscous supplementation at that time. Patient once again declined any type of surgical intervention.

## 2016-05-22 NOTE — Assessment & Plan Note (Signed)
Continue brace. 

## 2016-05-22 NOTE — Patient Instructions (Signed)
Goo to see yo u Ice 20 minutes 2 times daily. Usually after activity and before bed. pennsaid pinkie amount topically 2 times daily as needed.  We will try the brace  See me again in 10 weeks. We can do the orthovisc after January 29th. If pain before we will do another steroid injection

## 2016-05-29 ENCOUNTER — Other Ambulatory Visit: Payer: Self-pay | Admitting: Internal Medicine

## 2016-07-19 IMAGING — CT CT CERVICAL SPINE W/O CM
5 of 6 series · 14 of 33 positions shown, 16 images · non-contrast
Comparison: None.

CLINICAL DATA: Unwitnessed fall. Found on the floor. Hematoma to
the back of the head. Initial encounter.

EXAM:
CT HEAD WITHOUT CONTRAST
CT CERVICAL SPINE WITHOUT CONTRAST
TECHNIQUE: Multidetector CT imaging of the head and cervical spine was
performed following the standard protocol without intravenous
contrast. Multiplanar CT image reconstructions of the cervical spine
were also generated.

[Series 4: head 2.0 h70h · axial · 0.44mm/px · z∈[+1212,+1270]mm · 2 of 88 slices shown]
[im 30/88  bone]
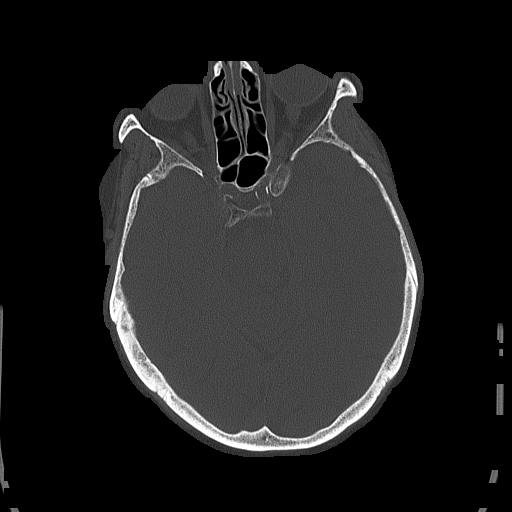
[im 59/88  bone]
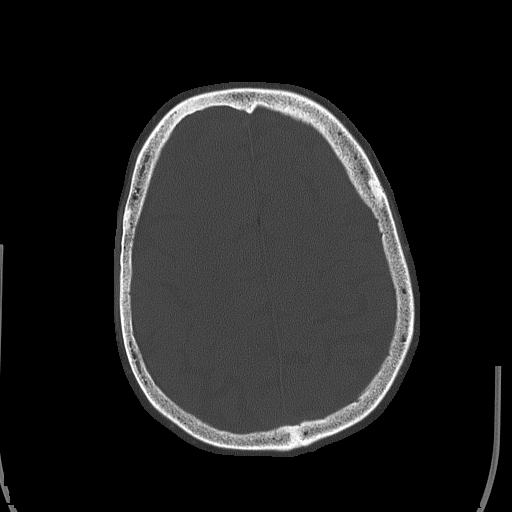

[Series 6: c_spine 2.0 i40s 3 · axial · 0.32mm/px · z∈[+1066,+1120]mm · 2 of 82 slices shown, 3 images]
[im 28/82  soft-tissue]
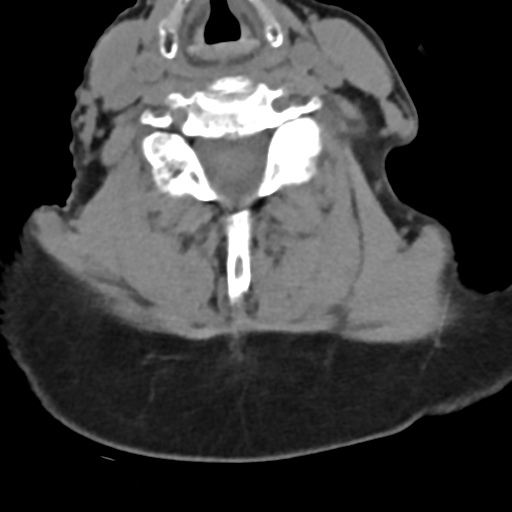
[im 28/82  bone]
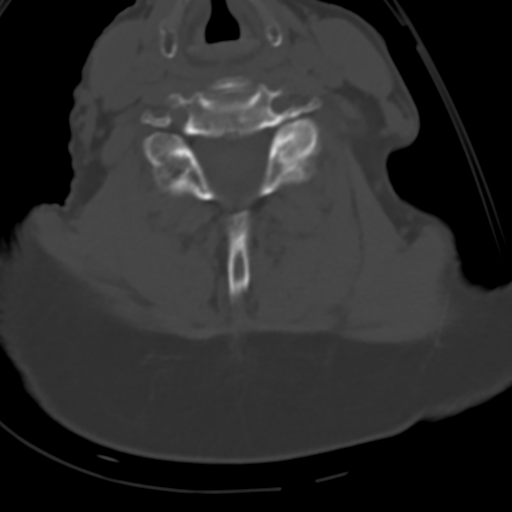
[im 55/82  bone]
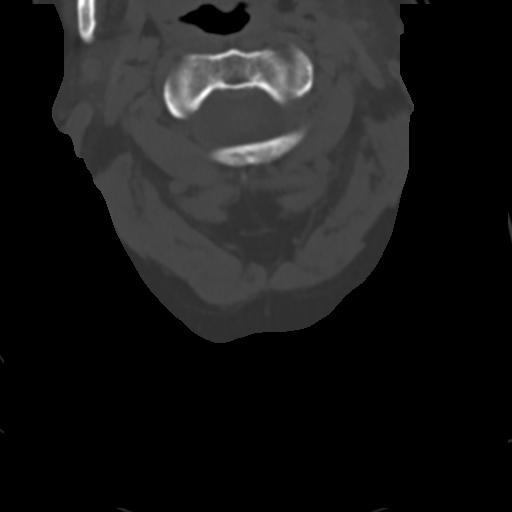

[Series 8: coronals · coronal · 0.33mm/px · 3 of 54 slices shown]
[im 12/54  bone]
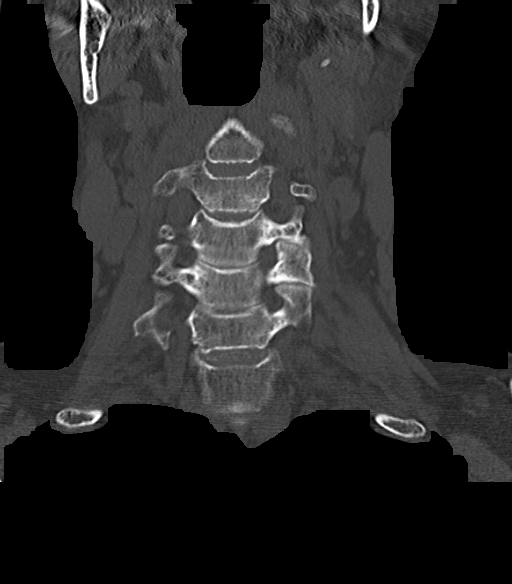
[im 22/54  bone]
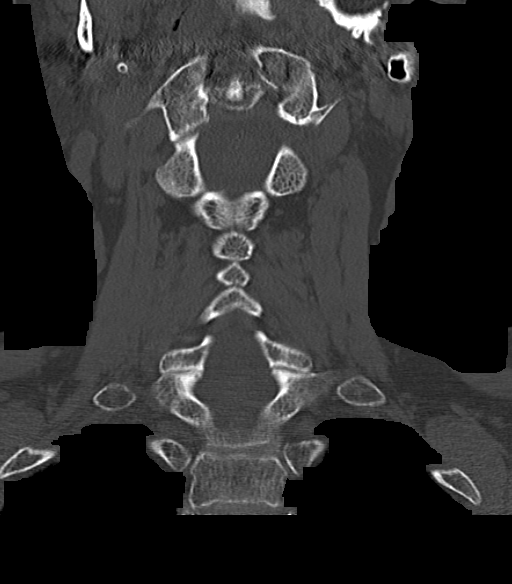
[im 32/54  bone]
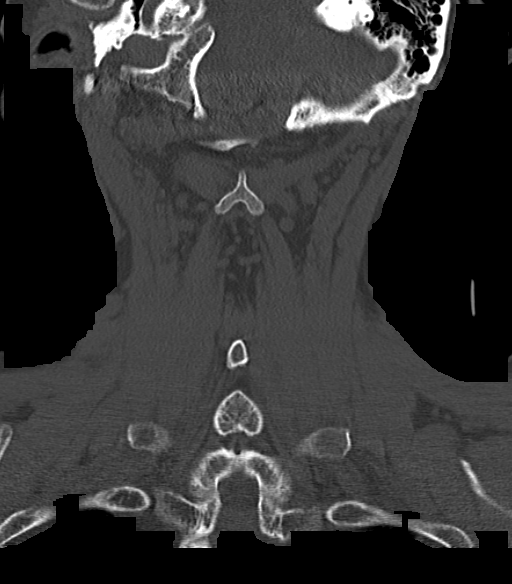

[Series 9: sagittals · sagittal · 0.32mm/px · 5 of 76 slices shown, 6 images]
[im 26/76  bone]
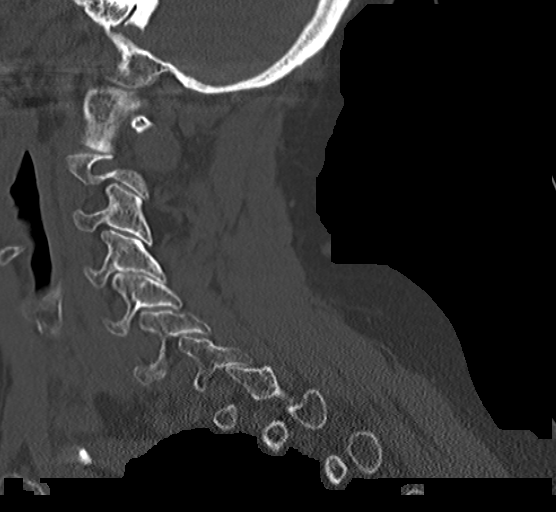
[im 32/76  bone]
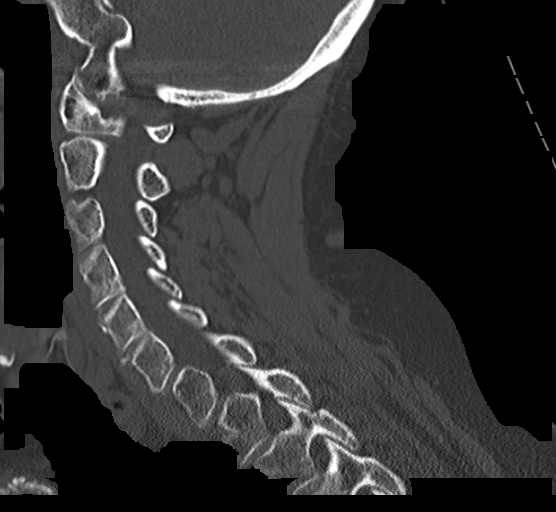
[im 38/76  soft-tissue]
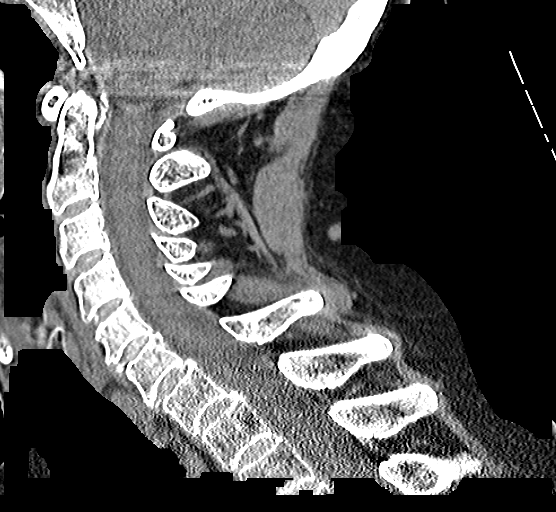
[im 38/76  bone]
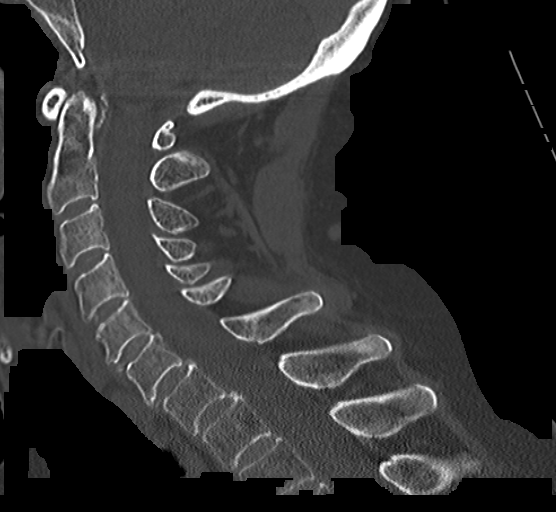
[im 44/76  bone]
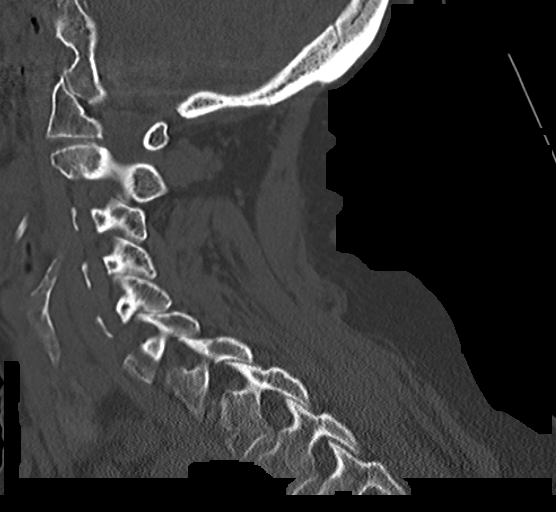
[im 51/76  bone]
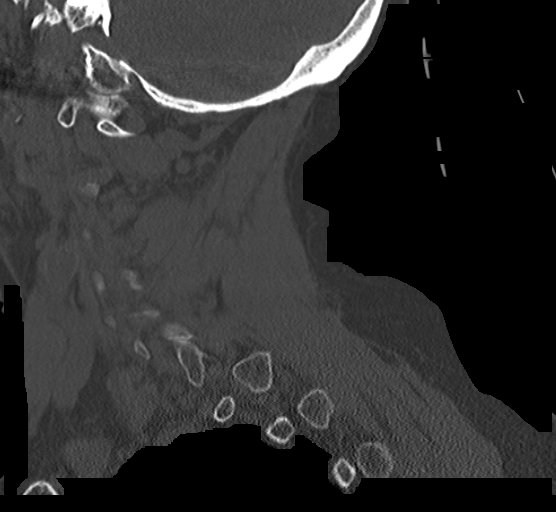

[Series 10: orthogonals · axial · 0.27mm/px · z∈[+1021,+1071]mm · 2 of 88 slices shown]
[im 30/88  bone]
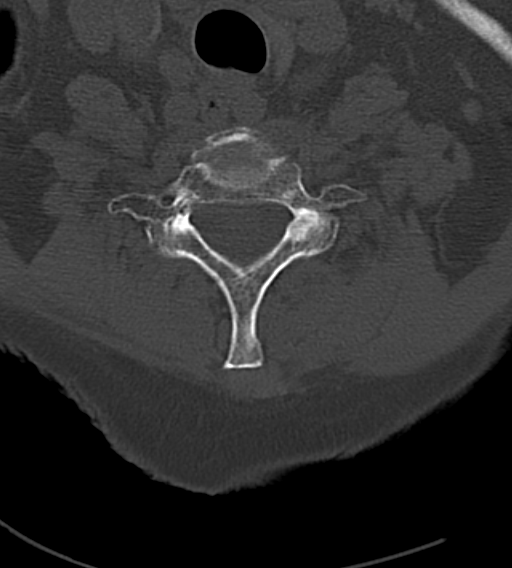
[im 59/88  bone]
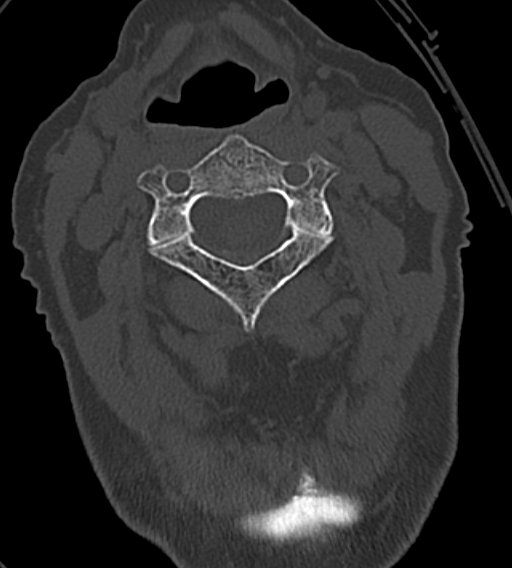

[14 of 33 positions shown; findings below may reference images not displayed]

FINDINGS: CT HEAD FINDINGS

Soft tissue swelling is present near the vertex to the right of
midline. There is no underlying fracture or gas within the soft
tissues to suggest a significant laceration.

Moderate generalized atrophy and white matter disease is present. No
acute cortical infarct or hemorrhage is present. There is no mass
lesion. The ventricles are of normal size. Insert pass fluid

The paranasal sinuses and mastoid air cells are clear. The calvarium
is intact.

CT CERVICAL SPINE FINDINGS

Cervical spine is imaged from the skullbase through T1-2. Grade 1
anterolisthesis is present at C6-7 and C7-T1. Vertebral body heights
and alignment are maintained. No acute fracture or traumatic
subluxation is evident.

Soft tissues of the neck demonstrate no focal mass lesion. The lung
apices are clear.
IMPRESSION: 1. Scalp soft tissue swelling to the right of midline near the
vertex without an underlying fracture.
2. Mild generalized atrophy and white matter disease is within
normal limits for age.
3. No acute intracranial abnormality.

## 2016-07-21 ENCOUNTER — Ambulatory Visit: Payer: Medicare Other | Admitting: Family Medicine

## 2016-07-28 ENCOUNTER — Other Ambulatory Visit: Payer: Self-pay | Admitting: Internal Medicine

## 2016-07-29 ENCOUNTER — Ambulatory Visit (INDEPENDENT_AMBULATORY_CARE_PROVIDER_SITE_OTHER): Payer: Medicare Other | Admitting: Family Medicine

## 2016-07-29 ENCOUNTER — Encounter: Payer: Self-pay | Admitting: Family Medicine

## 2016-07-29 DIAGNOSIS — M1991 Primary osteoarthritis, unspecified site: Secondary | ICD-10-CM

## 2016-07-29 DIAGNOSIS — M1711 Unilateral primary osteoarthritis, right knee: Secondary | ICD-10-CM | POA: Diagnosis not present

## 2016-07-29 NOTE — Patient Instructions (Signed)
Good to see you  Alvera Singh is your friend.  We will see you the next 3 weeks.  You know the drill.

## 2016-07-29 NOTE — Progress Notes (Signed)
[facility] [address] [place], [place] [address] Phone: [phone] Subjective:     CC: Right knee painfollow-up.   [id number]  Betty Chan is a 83 y.o. female coming in with complaint of right knee pain. Patient has been having right knee pain for multiple years.   Severe arthritis. Patient does not want any surgical intervention. Patient continues to have an stability of the knee. Patient had been having crit a steroid. Did respond fairly with the viscous supplementation previously that is now greater than 6 months ago. Patient's continues to have chronic pain on daily basis that does affect daily activities.  Past Medical History:  Diagnosis Date  . Aortic stenosis    s/p TAVR 08/2013 -follows at [facility] cards Mina [facility])  . Basal cell carcinoma   . Cancer of right breast [facility]) 2004   lumpectomy  . Dyslipidemia   . GERD (gastroesophageal reflux disease)   . Heart murmur   . Hypertension   . Hypothyroid   . OA (osteoarthritis) of knee    "right" (11/24/2014)  . Osteopenia 02/13/2014   DEXA 01/2014: -2.2  . PHN (postherpetic neuralgia)    L anterior thigh 2007, recurrent 04/2013  . Seizure ([place]) 2007   single event, off Keppra since 11/2013; "not sure if it was a TIA or seizure"  . Squamous cell cancer of skin of forearm   . TIA (transient ischemic attack) 2007   "not sure if it was a TIA or seizure"   Past Surgical History:  Procedure Laterality Date  . BASAL CELL CARCINOMA EXCISION     sternum  . BREAST BIOPSY Right 2004  . BREAST LUMPECTOMY Right 08/2002 X 2  . CARDIAC CATHETERIZATION  "several"  . CARDIAC VALVE REPLACEMENT    . CATARACT EXTRACTION W/ INTRAOCULAR LENS  IMPLANT, BILATERAL Bilateral 03/2014-07/2014  . SQUAMOUS CELL CARCINOMA EXCISION Left 06/2014   'forearm"  . TONSILLECTOMY    . TRANSCATHETER AORTIC VALVE REPLACEMENT, TRANSFEMORAL  09/06/13   [facility]   Social History  Substance Use Topics  . Smoking status: Never Smoker    . Smokeless tobacco: Never Used     Comment: widowed, lives in [place] living   . Alcohol use Yes     Comment: 11/24/2014 "1/2 glass of wine q 2-3 months"   Allergies  Allergen Reactions  . Amoxicillin Rash  . Penicillins Rash   Family History  Problem Relation Age of Onset  . Prostate cancer Father   . Breast cancer Sister      Past medical history, social, surgical and family history all reviewed in electronic medical record.   Review of Systems: No headache, visual changes, nausea, vomiting, diarrhea, constipation, dizziness, abdominal pain, skin rash, fevers, chills, night sweats, weight loss, swollen lymph nodes, body aches, joint swelling, muscle aches, chest pain, shortness of breath, mood changes.    Objective  Blood pressure 110/62, pulse 72, weight 164 lb (74.4 kg).  Systems examined below as of 07/29/16 General: NAD A&O x3 mood, affect normal  HEENT: Pupils equal, extraocular movements intact no nystagmus Respiratory: not short of breath at rest or with speaking Cardiovascular: No lower extremity edema, non tender Skin: Warm dry intact with no signs of infection or rash on extremities or on axial skeleton. Abdomen: Soft nontender, no masses Neuro: Cranial nerves  intact, neurovascularly intact in all extremities with 2+ DTRs and 2+ pulses. Lymph: No lymphadenopathy appreciated today  Gait normal with good balance and coordination.  MSK: Non tender  with full range of motion and good stability and symmetric strength and tone of shoulders, elbows, wrist, hips and ankles bilaterally.   Gait non ambulatory  MSK:  Non tender with full range of motion and good stability and symmetric strength and tone of shoulders, elbows, wrist, hip, and ankles bilaterally. Significant arthritic changes of multiple joints Knee: Right valgus deformity noted. Large thigh to calf ratio.  Tender to palpation over medial and PF joint line.  ROM full in flexion and extension and lower leg  rotation. instability with valgus force.  painful patellar compression. Patellar glide with moderate crepitus. Patellar and quadriceps tendons unremarkable. Hamstring and quadriceps strength is normal. Contralateral knee shows   After informed written and verbal consent, patient was seated on exam table. Right knee was prepped with alcohol swab and utilizing anterolateral approach, patient's right knee space was injected with15 mg/2.5 mL of Orthovisc(sodium hyaluronate) in a prefilled syringe was injected easily into the knee through a 22-gauge needle..Patient tolerated the procedure well without immediate complications...   Impression and Recommendations:     This case required medical decision making of moderate complexity.

## 2016-07-29 NOTE — Assessment & Plan Note (Signed)
Worsening symptoms. Discussed with patient at great length. Patient continues to be in the wheelchair. Does have severe arthritic changes. We'll start the viscous supplementation again. Patient return in one week for second in a series of 4 injections. Continue conservative therapy otherwise.

## 2016-08-05 ENCOUNTER — Ambulatory Visit (INDEPENDENT_AMBULATORY_CARE_PROVIDER_SITE_OTHER): Payer: Medicare Other | Admitting: Family Medicine

## 2016-08-05 ENCOUNTER — Encounter: Payer: Self-pay | Admitting: Family Medicine

## 2016-08-05 DIAGNOSIS — M1991 Primary osteoarthritis, unspecified site: Secondary | ICD-10-CM

## 2016-08-05 NOTE — Progress Notes (Signed)
[name] [address] [place], [place] [address] Phone: [phone] Subjective:     CC: Right knee painfollow-up.   [id number]  Betty Chan is a 83 y.o. female coming in with complaint of right knee pain. Patient has been having right knee pain for multiple years.   Severe arthritis. Patient has started viscous supple mentation for the knee. Did have some mild benefit last round. Patient is here for second in a series of 4 injections. Some pain for 1 day after the injection. Since then her knee better.  Past Medical History:  Diagnosis Date  . Aortic stenosis    s/p TAVR 08/2013 -follows at [facility] cards Mina [facility])  . Basal cell carcinoma   . Cancer of right breast [facility]) 2004   lumpectomy  . Dyslipidemia   . GERD (gastroesophageal reflux disease)   . Heart murmur   . Hypertension   . Hypothyroid   . OA (osteoarthritis) of knee    "right" (11/24/2014)  . Osteopenia 02/13/2014   DEXA 01/2014: -2.2  . PHN (postherpetic neuralgia)    L anterior thigh 2007, recurrent 04/2013  . Seizure ([place]) 2007   single event, off Keppra since 11/2013; "not sure if it was a TIA or seizure"  . Squamous cell cancer of skin of forearm   . TIA (transient ischemic attack) 2007   "not sure if it was a TIA or seizure"   Past Surgical History:  Procedure Laterality Date  . BASAL CELL CARCINOMA EXCISION     sternum  . BREAST BIOPSY Right 2004  . BREAST LUMPECTOMY Right 08/2002 X 2  . CARDIAC CATHETERIZATION  "several"  . CARDIAC VALVE REPLACEMENT    . CATARACT EXTRACTION W/ INTRAOCULAR LENS  IMPLANT, BILATERAL Bilateral 03/2014-07/2014  . SQUAMOUS CELL CARCINOMA EXCISION Left 06/2014   'forearm"  . TONSILLECTOMY    . TRANSCATHETER AORTIC VALVE REPLACEMENT, TRANSFEMORAL  09/06/13   [facility]   Social History  Substance Use Topics  . Smoking status: Never Smoker  . Smokeless tobacco: Never Used     Comment: widowed, lives in [facility] living   . Alcohol use Yes   Comment: 11/24/2014 "1/2 glass of wine q 2-3 months"   Allergies  Allergen Reactions  . Amoxicillin Rash  . Penicillins Rash   Family History  Problem Relation Age of Onset  . Prostate cancer Father   . Breast cancer Sister      Past medical history, social, surgical and family history all reviewed in electronic medical record.   Review of Systems: No headache, visual changes, nausea, vomiting, diarrhea, constipation, dizziness, abdominal pain, skin rash, fevers, chills, night sweats, weight loss, swollen lymph nodes, body aches, joint swelling, muscle aches, chest pain, shortness of breath, mood changes.  Mild increase in forgetfullness.    Objective  Blood pressure (!) 148/72, pulse 66, height 5\' 7"  (1.702 m), weight 164 lb 6.4 oz (74.6 kg).  Systems examined below as of 08/05/16 General: NAD A&O x3 mood, affect normal  HEENT: Pupils equal, extraocular movements intact no nystagmus Respiratory: not short of breath at rest or with speaking Cardiovascular: No lower extremity edema, non tender Skin: Warm dry intact with no signs of infection or rash on extremities or on axial skeleton. Abdomen: Soft nontender, no masses Neuro: Cranial nerves  intact, neurovascularly intact in all extremities with 2+ DTRs and 2+ pulses. Lymph: No lymphadenopathy appreciated today  Gait non ambulatory  MSK:  Non tender with full range of motion and good  stability and symmetric strength and tone of shoulders, elbows, wrist, hip, and ankles bilaterally. Significant arthritic changes of multiple joints Knee: Right valgus deformity noted. Large thigh to calf ratio.  Tender to palpation over medial and PF joint line.  ROM full in flexion and extension and lower leg rotation. instability with valgus force.  painful patellar compression. Patellar glide with moderate crepitus. Patellar and quadriceps tendons unremarkable. Hamstring and quadriceps strength is normal. Contralateral knee shows    After  informed written and verbal consent, patient was seated on exam table. Right knee was prepped with alcohol swab and utilizing anterolateral approach, patient's right knee space was injected with15 mg/2.5 mL of Orthovisc(sodium hyaluronate) in a prefilled syringe was injected easily into the knee through a 22-gauge needle..Patient tolerated the procedure well without immediate complications....   Impression and Recommendations:     This case required medical decision making of moderate complexity.

## 2016-08-05 NOTE — Patient Instructions (Signed)
Good to see you  2 down and 2 to go  You know the drill  See you again in 1 week.

## 2016-08-05 NOTE — Assessment & Plan Note (Signed)
2nd injection of 4 see again in 1 weeks for #3 Continue conservative therapy.

## 2016-08-11 ENCOUNTER — Ambulatory Visit: Payer: Medicare Other | Admitting: Family Medicine

## 2016-08-18 ENCOUNTER — Encounter: Payer: Self-pay | Admitting: Family Medicine

## 2016-08-18 ENCOUNTER — Ambulatory Visit (INDEPENDENT_AMBULATORY_CARE_PROVIDER_SITE_OTHER): Payer: Medicare Other | Admitting: Family Medicine

## 2016-08-18 DIAGNOSIS — M1991 Primary osteoarthritis, unspecified site: Secondary | ICD-10-CM | POA: Diagnosis not present

## 2016-08-18 NOTE — Patient Instructions (Signed)
3 down You know the drill Ice is your friend. See me again in 1 week for the least one!

## 2016-08-18 NOTE — Assessment & Plan Note (Signed)
Doing well again with the viscous supplementation. Encourage patient to continue conservative therapy. Patient will continue with increasing range of motion. We discussed icing regimen. Patient will stay active. Follow-up again in 1 week for fourth and final injection.

## 2016-08-18 NOTE — Progress Notes (Signed)
[facility] [address] [place], [place] [address] Phone: [phone] Subjective:     CC: Right knee painfollow-up.   [id number]  Betty Chan is a 83 y.o. female coming in with complaint of right knee pain. Patient has been having right knee pain for multiple years.   Severe arthritis. Patient has started viscous supple mentation for the knee. Did have some mild benefit last round. Patient is here for 3rd in a series of 4 injections. Doing much better again. States that it is not as painful. Not making as much of the popping sound either.  Past Medical History:  Diagnosis Date  . Aortic stenosis    s/p TAVR 08/2013 -follows at [facility] cards [facility])  . Basal cell carcinoma   . Cancer of right breast [facility]) 2004   lumpectomy  . Dyslipidemia   . GERD (gastroesophageal reflux disease)   . Heart murmur   . Hypertension   . Hypothyroid   . OA (osteoarthritis) of knee    "right" (11/24/2014)  . Osteopenia 02/13/2014   DEXA 01/2014: -2.2  . PHN (postherpetic neuralgia)    L anterior thigh 2007, recurrent 04/2013  . Seizure ([place]) 2007   single event, off Keppra since 11/2013; "not sure if it was a TIA or seizure"  . Squamous cell cancer of skin of forearm   . TIA (transient ischemic attack) 2007   "not sure if it was a TIA or seizure"   Past Surgical History:  Procedure Laterality Date  . BASAL CELL CARCINOMA EXCISION     sternum  . BREAST BIOPSY Right 2004  . BREAST LUMPECTOMY Right 08/2002 X 2  . CARDIAC CATHETERIZATION  "several"  . CARDIAC VALVE REPLACEMENT    . CATARACT EXTRACTION W/ INTRAOCULAR LENS  IMPLANT, BILATERAL Bilateral 03/2014-07/2014  . SQUAMOUS CELL CARCINOMA EXCISION Left 06/2014   'forearm"  . TONSILLECTOMY    . TRANSCATHETER AORTIC VALVE REPLACEMENT, TRANSFEMORAL  09/06/13   [facility]   Social History  Substance Use Topics  . Smoking status: Never Smoker  . Smokeless tobacco: Never Used     Comment: widowed, lives in [facility]  living   . Alcohol use Yes     Comment: 11/24/2014 "1/2 glass of wine q 2-3 months"   Allergies  Allergen Reactions  . Amoxicillin Rash  . Penicillins Rash   Family History  Problem Relation Age of Onset  . Prostate cancer Father   . Breast cancer Sister      Past medical history, social, surgical and family history all reviewed in electronic medical record.   Review of Systems: No headache, visual changes, nausea, vomiting, diarrhea, constipation, dizziness, abdominal pain, skin rash, fevers, chills, night sweats, weight loss, swollen lymph nodes, body aches, joint swelling, muscle aches, chest pain, shortness of breath, mood changes.  Mild increase in forgetfullness.  No change from previous exam   Objective  Blood pressure 122/82, pulse 62, height 5' 6.5" (1.689 m), weight 168 lb (76.2 kg).  Systems examined below as of 08/18/16 General: NAD A&O x3 mood, affect normal  HEENT: Pupils equal, extraocular movements intact no nystagmus Respiratory: not short of breath at rest or with speaking Cardiovascular: No lower extremity edema, non tender Skin: Warm dry intact with no signs of infection or rash on extremities or on axial skeleton. Abdomen: Soft nontender, no masses Neuro: Cranial nerves  intact, neurovascularly intact in all extremities with 2+ DTRs and 2+ pulses. Lymph: No lymphadenopathy appreciated today  Gait wheelchair bound  MSK: Non tender with full range of motion and good stability and symmetric strength and tone of shoulders, elbows, wrist,  hips and ankles bilaterally.  Significant arthritic changes Knee: Right valgus deformity noted. Large thigh to calf ratio.  Tender to palpation over medial and PF joint line.  ROM full in flexion and extension and lower leg rotation. instability with valgus force.  painful patellar compression. Patellar glide with moderate crepitus. Patellar and quadriceps tendons unremarkable. Hamstring and quadriceps strength is  normal. Contralateral knee shows  chronic arthritic changes as well. Mild instability noted. Crepitus noted.  After informed written and verbal consent, patient was seated on exam table. Right knee was prepped with alcohol swab and utilizing anterolateral approach, patient's right knee space was injected with15 mg/2.5 mL of Orthovisc(sodium hyaluronate) in a prefilled syringe was injected easily into the knee through a 22-gauge needle..Patient tolerated the procedure well without immediate complications....   Impression and Recommendations:     This case required medical decision making of moderate complexity.

## 2016-08-25 ENCOUNTER — Encounter: Payer: Self-pay | Admitting: Family Medicine

## 2016-08-25 ENCOUNTER — Ambulatory Visit (INDEPENDENT_AMBULATORY_CARE_PROVIDER_SITE_OTHER): Payer: Medicare Other | Admitting: Family Medicine

## 2016-08-25 DIAGNOSIS — M1991 Primary osteoarthritis, unspecified site: Secondary | ICD-10-CM | POA: Diagnosis not present

## 2016-08-25 NOTE — Assessment & Plan Note (Signed)
Fourth and final injection given today in the series. Encourage her to continue with range of motion, physical therapy then she has in her nursing home, icing regimen. Patient will try topical anti-inflammatories that beneficial. Patient can follow-up with me as needed. Hopefully that this will give her some benefit for approximately 6 months.

## 2016-08-25 NOTE — Progress Notes (Signed)
[name] [address] [place], [place] [address] Phone: [phone] Subjective:     CC: Right knee painfollow-up.   [id number]  Betty Chan is a 83 y.o. female coming in with complaint of right knee pain. Patient has been having right knee pain for multiple years.   Severe arthritis. Patient has started viscous supple mentation for the knee. Did have some mild benefit last round. Patient is here for 4th in a series of 4 injections. Continues to make improvement. States that the worst pain seems to be when she wakes up at the morning. With more activity seems to do better.  Past Medical History:  Diagnosis Date  . Aortic stenosis    s/p TAVR 08/2013 -follows at [facility] cards [facility])  . Basal cell carcinoma   . Cancer of right breast [facility]) 2004   lumpectomy  . Dyslipidemia   . GERD (gastroesophageal reflux disease)   . Heart murmur   . Hypertension   . Hypothyroid   . OA (osteoarthritis) of knee    "right" (11/24/2014)  . Osteopenia 02/13/2014   DEXA 01/2014: -2.2  . PHN (postherpetic neuralgia)    L anterior thigh 2007, recurrent 04/2013  . Seizure ([place]) 2007   single event, off Keppra since 11/2013; "not sure if it was a TIA or seizure"  . Squamous cell cancer of skin of forearm   . TIA (transient ischemic attack) 2007   "not sure if it was a TIA or seizure"   Past Surgical History:  Procedure Laterality Date  . BASAL CELL CARCINOMA EXCISION     sternum  . BREAST BIOPSY Right 2004  . BREAST LUMPECTOMY Right 08/2002 X 2  . CARDIAC CATHETERIZATION  "several"  . CARDIAC VALVE REPLACEMENT    . CATARACT EXTRACTION W/ INTRAOCULAR LENS  IMPLANT, BILATERAL Bilateral 03/2014-07/2014  . SQUAMOUS CELL CARCINOMA EXCISION Left 06/2014   'forearm"  . TONSILLECTOMY    . TRANSCATHETER AORTIC VALVE REPLACEMENT, TRANSFEMORAL  09/06/13   [facility]   Social History  Substance Use Topics  . Smoking status: Never Smoker  . Smokeless tobacco: Never Used   Comment: widowed, lives in [facility]   . Alcohol use Yes     Comment: 11/24/2014 "1/2 glass of wine q 2-3 months"   Allergies  Allergen Reactions  . Amoxicillin Rash  . Penicillins Rash   Family History  Problem Relation Age of Onset  . Prostate cancer Father   . Breast cancer Sister      Past medical history, social, surgical and family history all reviewed in electronic medical record.   Review of Systems: No headache, visual changes, nausea, vomiting, diarrhea, constipation, dizziness, abdominal pain, skin rash, fevers, chills, night sweats, weight loss, swollen lymph nodes, body aches, joint swelling, muscle aches, chest pain, shortness of breath, mood changes.  Mild increase in forgetfullness.  Continues to be stable. 08/25/16     Objective  Blood pressure 124/74, pulse 72, height 5' 6.5" (1.689 m), weight 164 lb (74.4 kg).  Systems examined below as of 08/25/16 General: NAD A&O x3 mood, affect normal  HEENT: Pupils equal, extraocular movements intact no nystagmus Respiratory: not short of breath at rest or with speaking Cardiovascular: No lower extremity edema, non tender Skin: Warm dry intact with no signs of infection or rash on extremities or on axial skeleton. Abdomen: Soft nontender, no masses Neuro: Cranial nerves  intact, neurovascularly intact in all extremities with 2+ DTRs and 2+ pulses. Lymph: No lymphadenopathy appreciated  today  Gait wheelchair bound  MSK: Non tender with full range of motion and good stability and symmetric strength and tone of shoulders, elbows, wrist,  hips and ankles bilaterally.  Significant arthritic changes Knee: Right valgus deformity noted. Large thigh to calf ratio.  Tender to palpation over medial and PF joint line.  ROM full in flexion and extension and lower leg rotation. instability with valgus force.  painful patellar compression. Patellar glide with moderate crepitus. Patellar and quadriceps tendons  unremarkable. Hamstring and quadriceps strength is normal. Contralateral knee shows  chronic  arthritic changes as well. Mild instability noted. Crepitus noted.  After informed written and verbal consent, patient was seated on exam table. Right knee was prepped with alcohol swab and utilizing anterolateral approach, patient's right knee space was injected with15 mg/2.5 mL of Orthovisc(sodium hyaluronate) in a prefilled syringe was injected easily into the knee through a 22-gauge needle..Patient tolerated the procedure well without immediate complications...   Impression and Recommendations:     This case required medical decision making of moderate complexity.

## 2016-09-17 ENCOUNTER — Telehealth: Payer: Self-pay | Admitting: Internal Medicine

## 2016-09-17 NOTE — Telephone Encounter (Signed)
Pt new pharmacy is [facility] on [address]

## 2016-09-17 NOTE — Telephone Encounter (Signed)
Pharmacy requesting refill on atorvastatin.

## 2016-09-17 NOTE — Telephone Encounter (Signed)
Pt daughter called wanting to know about the refill on the Lipitor, she was advised that since a different provider prescribed it, Dr Sharlet Salina has to approve it. Was advised we should have an answer tomorrow.  Betty Chan [phone] Daughter will call back tomorrow

## 2016-09-17 NOTE — Telephone Encounter (Signed)
Please advise, different Perscriber listed

## 2016-09-18 NOTE — Telephone Encounter (Signed)
She was supposed to stop taking and follow up with [place]. Is she taking? This has not been filled for more than 2 years.

## 2016-09-18 NOTE — Telephone Encounter (Signed)
Tried contacting and got no answer, unable to LVM

## 2016-09-30 ENCOUNTER — Telehealth: Payer: Self-pay

## 2016-09-30 MED ORDER — METOPROLOL TARTRATE 25 MG PO TABS: 12.5000 mg | ORAL_TABLET | Freq: Two times a day (BID) | ORAL | 0 refills | Status: DC

## 2016-09-30 NOTE — Telephone Encounter (Signed)
Med refill

## 2016-10-02 ENCOUNTER — Other Ambulatory Visit: Payer: Self-pay

## 2016-10-02 ENCOUNTER — Telehealth: Payer: Self-pay | Admitting: Internal Medicine

## 2016-10-02 DIAGNOSIS — M25569 Pain in unspecified knee: Secondary | ICD-10-CM

## 2016-10-02 NOTE — Telephone Encounter (Signed)
Betty Chan from [facility] called and said that the pt and her daughter are requesting physical therapy. She said that the pt is having trouble getting up out of her chair. Please advise.

## 2016-10-03 NOTE — Telephone Encounter (Signed)
Left message for Anda Kraft inquiring about the process to get patient referral for physical therapy.

## 2016-10-06 NOTE — Telephone Encounter (Signed)
Betty Chan left me a message stating that it would be fine to leave a verbal referral on her voicemail. Called her back and left her a voicemail confirming ok for physical therapy.

## 2016-10-26 ENCOUNTER — Other Ambulatory Visit: Payer: Self-pay | Admitting: Internal Medicine

## 2016-10-31 ENCOUNTER — Telehealth: Payer: Self-pay | Admitting: Internal Medicine

## 2016-10-31 NOTE — Telephone Encounter (Signed)
Would like to receive samples of Pensaid.

## 2016-11-03 ENCOUNTER — Telehealth: Payer: Self-pay | Admitting: Internal Medicine

## 2016-11-03 NOTE — Telephone Encounter (Signed)
Would like to receive samples of Pensaid.

## 2016-11-03 NOTE — Telephone Encounter (Signed)
LVm for patient that we don't have them and she needs to ask Dr.Smith

## 2016-11-03 NOTE — Telephone Encounter (Signed)
Is this appropriate?  

## 2016-11-03 NOTE — Telephone Encounter (Signed)
I do not have any, she would need to talk to Dr. Tamala Julian about that.

## 2016-11-04 ENCOUNTER — Other Ambulatory Visit: Payer: Self-pay

## 2016-11-04 MED ORDER — DICLOFENAC SODIUM 1 % TD GEL: 2.0000 g | Freq: Three times a day (TID) | TRANSDERMAL | 0 refills | Status: DC

## 2016-11-04 NOTE — Telephone Encounter (Signed)
Per Dr. Tamala Julian verbal, he recommends a prescription for voltaren gel as we do not have any Pennsaid samples. Rx called in for patient.

## 2016-11-19 ENCOUNTER — Other Ambulatory Visit: Payer: Self-pay | Admitting: *Deleted

## 2016-11-25 ENCOUNTER — Telehealth: Payer: Self-pay | Admitting: Internal Medicine

## 2016-11-25 NOTE — Telephone Encounter (Signed)
Please advise, not filled since 2015 by different provider

## 2016-11-25 NOTE — Telephone Encounter (Signed)
Has someone else been prescribing synthroid for her? We have never prescribed and she likely would need visit with labs.

## 2016-11-25 NOTE — Telephone Encounter (Signed)
Pt would like a refill of levothyroxine (SYNTHROID, LEVOTHROID) 50 MCG tablet  And omeprazole (PRILOSEC) 20 MG capsule   Please send to [facility]    Would like a call back when done  [name] [phone]

## 2016-11-25 NOTE — Telephone Encounter (Signed)
LVM with note and to call back if she has questions or if she wants to schedule

## 2016-12-23 ENCOUNTER — Other Ambulatory Visit: Payer: Self-pay | Admitting: Internal Medicine

## 2016-12-27 ENCOUNTER — Other Ambulatory Visit: Payer: Self-pay | Admitting: Family Medicine

## 2017-01-12 ENCOUNTER — Other Ambulatory Visit: Payer: Self-pay | Admitting: Internal Medicine

## 2017-01-18 ENCOUNTER — Other Ambulatory Visit: Payer: Self-pay | Admitting: Family

## 2017-01-19 ENCOUNTER — Other Ambulatory Visit: Payer: Self-pay | Admitting: Family

## 2017-02-06 ENCOUNTER — Other Ambulatory Visit: Payer: Self-pay | Admitting: Family Medicine

## 2017-02-07 ENCOUNTER — Other Ambulatory Visit: Payer: Self-pay | Admitting: Family

## 2017-02-19 ENCOUNTER — Other Ambulatory Visit: Payer: Self-pay | Admitting: Family

## 2017-02-24 ENCOUNTER — Other Ambulatory Visit: Payer: Self-pay | Admitting: *Deleted

## 2017-02-24 MED ORDER — METOPROLOL TARTRATE 25 MG PO TABS: 12.5000 mg | ORAL_TABLET | Freq: Two times a day (BID) | ORAL | 0 refills | Status: DC

## 2017-03-10 ENCOUNTER — Other Ambulatory Visit: Payer: Self-pay | Admitting: Family

## 2017-04-10 ENCOUNTER — Other Ambulatory Visit: Payer: Self-pay | Admitting: Family Medicine

## 2017-04-13 NOTE — Telephone Encounter (Signed)
Refill done.  

## 2017-05-11 NOTE — Progress Notes (Signed)
Betty Chan was seen today in the movement disorders clinic for neurologic consultation.  She has seen several other neurologists previously but I don't have all of their records.  The consultation is for the evaluation of gait change and falls. This patient is accompanied in the office by her daughter, [name],  who supplements the history. She saw Dr. Delice Lesch in 2015.  This was primarily a consult for seizure/medication management. She had transferred care from her neurologist in [place].  She had recalled that in 2008 she went to to graduation parties and had a glass of wine at each party and briefly lost consciousness.  The only warning that she had was some diaphoresis prior to the event.  The entire event consisted of her picking up a glass of water and started to take a drink, dropping a bottle of water and then staring off.  She dropped her head and sat limp in the chair for 3 or 4 minutes and he could not arouse her and he contacted EMS.  She was regaining consciousness when EMS arrived.  EMS reported low blood pressure.  She apparently had an MRI of the brain at the time which showed mild atrophy, small vessel disease and several chronic lacunar infarcts in the corona radiata.  EEG apparently indicated epileptiform discharges in the right temporal region and she was started on Keppra.  She had been on Keppra since 2008 and had no further events.  Dr. Delice Lesch did an EEG and it was normal and she has been off of [place] ever since.  She presents today to discuss gait and balance issues.  Dr. Delice Lesch did mention some rigidity and postural instability at the time and mentioned possible consideration for levodopa.  She did not see the patient back after that visit.  The patient did seek care with Dr. Lawrence Marseilles at [facility] in 2017.  I don't have those records.   She apparently had an extensive work up consisting of spinal tap, EMG and was told everything was okay.   He started her on  carbidopa/levodopa 25/100, 1 po bid and then increased to 1 po tid (morning, lunch and evening).  She thinks that it is helpful but isn't sure what it helps with.  She tried to d/c it but things got worse.    She saw ENT in October, 2017.  I do have those records.  It was felt that she had dysphonia likely due to a parkinsonian syndrome.  Looking back, daughter states that falls and small handwriting were the first issues.     Specific Symptoms:  Tremor: Yes.   , R hand with eating Family hx of similar:  No. Voice: got quiet x 3-5 years Sleep: sleeps well  Vivid Dreams:  No.  Acting out dreams:  No. (but daughter tells me about incident few years ago when she lived in [place] and fell out of the bed and got caught between the bed and nightstand for 7 hours and had to go to rehab afterwards.  They are not sure, but thinks she was asleep at the time) Wet Pillows: No. Postural symptoms:  Yes.    Falls?  Yes.  , but is now mostly WC bound with the exception of transfers.  WC bound x 1 year.  Has trouble lifting L leg when walking and has R knee arthritis.   Bradykinesia symptoms: difficulty getting out of a chair Loss of smell:  No. Loss of taste:  No. Urinary Incontinence:  Yes.  , cannot get there fast enough - wears undergarments.   Difficulty Swallowing:  No. Handwriting, micrographia: Yes.   Trouble with ADL's:  Yes.   - has assist x 1 year  Trouble buttoning clothing: Yes.   Depression:  Yes, some but also anxious (put on new med and its been helpful) Memory changes:  No. Hallucinations:  No.  visual distortions: Yes.   N/V:  No. Lightheaded:  No.  Syncope: No. Diplopia:  Yes.  , but if wears glasses it is okay.  Is horizontal.  Is not sure if would go away if closed eye.  Only lasts seconds. Dyskinesia:  No.  Neuroimaging of the brain has  previously been performed.  It available for my review today.  She had a CT of the head in 2016 that demonstrated mild atrophy and small vessel  disease.  She has not had an MRI of the brain  PREVIOUS MEDICATIONS: Sinemet  ALLERGIES:   Allergies  Allergen Reactions  . Amoxicillin Rash  . Penicillins Rash    CURRENT MEDICATIONS:  Outpatient Encounter Medications as of 05/18/2017  Medication Sig  . acetaminophen (TYLENOL) 500 MG tablet Take 1 tablet (500 mg total) by mouth every 6 (six) hours as needed for fever. (Patient taking differently: Take 500 mg by mouth every 8 (eight) hours. )  . aspirin 81 MG tablet Take 81 mg by mouth daily.  Marland Kitchen atorvastatin (LIPITOR) 20 MG tablet TAKE 1 TABLET BY MOUTH EVERY DAY AT 6 PM  . Biotin 5000 MCG CAPS Take 5,000 mcg by mouth daily.  . carbidopa-levodopa (SINEMET IR) 25-100 MG tablet 1 tablet TID  . cholecalciferol (VITAMIN D) 1000 UNITS tablet Take 1,000 Units by mouth 2 (two) times daily.   . diclofenac sodium (VOLTAREN) 1 % GEL APPLY 2 GRAMS TOPICALLY 3 (THREE) TIMES DAILY.  Marland Kitchen glucosamine-chondroitin 500-400 MG tablet Take 1 tablet by mouth 2 (two) times daily.  Marland Kitchen levothyroxine (SYNTHROID, LEVOTHROID) 50 MCG tablet Take 1 tablet (50 mcg total) by mouth daily before breakfast.  . lisinopril (PRINIVIL,ZESTRIL) 5 MG tablet Take 1 tablet (5 mg total) by mouth daily. Annual visit due in Jan for further refills  . metoprolol tartrate (LOPRESSOR) 25 MG tablet Take 0.5 tablets (12.5 mg total) by mouth 2 (two) times daily.  . mirabegron ER (MYRBETRIQ) 50 MG TB24 tablet Take 50 mg by mouth daily.  . Multiple Vitamins-Minerals (CENTRUM FLAVOR BURST ADULT) CHEW Chew 2 tablets by mouth daily.  Marland Kitchen omeprazole (PRILOSEC) 20 MG capsule Take 1 capsule (20 mg total) by mouth daily.  Marland Kitchen oxybutynin (DITROPAN) 5 MG tablet Take 2 tablets (10 mg total) by mouth 2 (two) times daily.  . sertraline (ZOLOFT) 50 MG tablet Take 50 mg by mouth daily.  . TURMERIC PO Take 1 tablet by mouth daily.  . clindamycin (CLEOCIN) 150 MG capsule Take 600 mg by mouth as needed (1 hour prior to dental appointment).   . [DISCONTINUED]  OVER THE COUNTER MEDICATION Apply 1 application topically daily. Pennsaid   No facility-administered encounter medications on file as of 05/18/2017.     PAST MEDICAL HISTORY:   Past Medical History:  Diagnosis Date  . Aortic stenosis    s/p TAVR 08/2013 -follows at [facility] cards Mina [facility])  . Basal cell carcinoma   . Cancer of right breast [facility]) 2004   lumpectomy  . Dyslipidemia   . GERD (gastroesophageal reflux disease)   . Heart murmur   . Hypertension   . Hypothyroid   .  OA (osteoarthritis) of knee    "right" (11/24/2014)  . Osteopenia 02/13/2014   DEXA 01/2014: -2.2  . PHN (postherpetic neuralgia)    L anterior thigh 2007, recurrent 04/2013  . Seizure ([place]) 2007   single event, off Keppra since 11/2013; "not sure if it was a TIA or seizure"  . Squamous cell cancer of skin of forearm   . TIA (transient ischemic attack) 2007   "not sure if it was a TIA or seizure"    PAST SURGICAL HISTORY:   Past Surgical History:  Procedure Laterality Date  . BASAL CELL CARCINOMA EXCISION     sternum  . BREAST BIOPSY Right 2004  . BREAST LUMPECTOMY Right 08/2002 X 2  . CARDIAC CATHETERIZATION  "several"  . CARDIAC VALVE REPLACEMENT    . CATARACT EXTRACTION W/ INTRAOCULAR LENS  IMPLANT, BILATERAL Bilateral 03/2014-07/2014  . SQUAMOUS CELL CARCINOMA EXCISION Left 06/2014   'forearm"  . TONSILLECTOMY    . TRANSCATHETER AORTIC VALVE REPLACEMENT, TRANSFEMORAL  09/06/13   [facility]    SOCIAL HISTORY:   Social History   Socioeconomic History  . Marital status: Widowed    Spouse name: Not on file  . Number of children: Not on file  . Years of education: Not on file  . Highest education level: Not on file  Social Needs  . Financial resource strain: Not on file  . Food insecurity - worry: Not on file  . Food insecurity - inability: Not on file  . Transportation needs - medical: Not on file  . Transportation needs - non-medical: Not on file  Occupational History  . Not on file  Tobacco Use  .  Smoking status: Never Smoker  . Smokeless tobacco: Never Used  . Tobacco comment: widowed, lives in [place] living   Substance and Sexual Activity  . Alcohol use: Yes    Comment: 11/24/2014 "1/2 glass of wine q 2-3 months"  . Drug use: No  . Sexual activity: No  Other Topics Concern  . Not on file  Social History Narrative  . Not on file    FAMILY HISTORY:   Family Status  Relation Name Status  . Father  (Not Specified)  . Sister  (Not Specified)    ROS:  A complete 10 system review of systems was obtained and was unremarkable apart from what is mentioned above.  PHYSICAL EXAMINATION:    VITALS:   Vitals:   05/18/17 1028  BP: 130/70  Pulse: 84  SpO2: 92%    GEN:  The patient appears stated age and is in NAD. HEENT:  Normocephalic, atraumatic.  The mucous membranes are moist. The superficial temporal arteries are without ropiness or tenderness. CV:  RRR Lungs:  CTAB Neck/HEME:  There are no carotid bruits bilaterally.  Neurological examination:  Orientation: The patient is alert and oriented x3. Fund of knowledge is appropriate.  Recent and remote memory are intact.  Attention and concentration are normal.    Able to name objects and repeat phrases. Cranial nerves: There is good facial symmetry.  There is decreased blink with facial hypomania.  Pupils are equal round and reactive to light bilaterally. Fundoscopic exam is attempted but the disc margins are not well visualized bilaterally.   There is mild esotropia on the right.  Horizontal gaze is intact bilaterally, but she has difficulty with upgaze and mild difficulty with down gaze.  She is somewhat apraxic when attempting to test visual fields.  The same is true when testing for overshoot dysmetria.  The speech is fluent and clear that she is quite hypophonic.  She has mild difficulty with guttural sounds.  Soft palate rises symmetrically and there is no tongue deviation. Hearing is intact to conversational tone. Sensation:  Sensation is intact to light and pinprick throughout (facial, trunk, extremities). Vibration is intact at the bilateral big toe. There is no extinction with double simultaneous stimulation. There is no sensory dermatomal level identified. Motor: Strength is 5/5 in the bilateral upper and lower extremities.   Shoulder shrug is equal and symmetric.  There is no pronator drift. Deep tendon reflexes: Deep tendon reflexes are 2/4 at the bilateral biceps, triceps, brachioradialis, patella and achilles. Plantar responses are downgoing bilaterally.  Movement examination: Tone: There is normal tone in the bilateral upper extremities.  There is increased tone in the R>LLE.    Abnormal movements: none Coordination:  There is decremation with RAM's, with any form of RAMS, including alternating supination and pronation of the forearm, hand opening and closing, finger taps, heel taps and toe taps bilaterally. Gait and Station: The patient requires assistance to get out of the chair (was given a gait belt).  Once up, she was given a walker and still required help with a gait belt.  She walks on her tiptoes.  She abducts at the left hip.  She is very unstable.  The feet stick to the ground, particularly on the left.  ASSESSMENT/PLAN:  1.  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  We talked about nature, etiology and pathophysiology. We talked about how the symptoms, course and prognosis differ from Parkinson's Disease.  We talked about the risks, particularly for falls and aspiration.   -She is already receiving daily physical therapy.  -The patient will have a MBE   -She is on levodopa.  We talked about the value of this medication.  Many times, it is not that helpful in this disease, but often times will try it in much higher dosages than that she is currently on to see if  patients get any benefit from it.  Since she has no side effects, she will slowly work up from 1 tablet 3 times per day to 2 tablets 3 times per day.  Risks, benefits, side effects and alternative therapies were discussed.  The opportunity to ask questions was given and they were answered to the best of my ability.  The patient expressed understanding and willingness to follow the outlined treatment protocols.  -We talked about the value of a DaT scan.  She would like to proceed with that.  I do not think that changes much what I do clinically, but the patient is very interested.  -I think that the patient needs a higher level of living, and at least assisted living.  She is currently in independent living, but they hire caregivers to come help her get dressed.  However, she has no one to help her transfer when going to the bathroom and I do not think she is safe to do that.  Her daughter agrees.  She met with our social worker today as well to discuss future options.  I also talked to them about our support group that is to start in the future for patients like herself.  -I will try to get a copy of her previous records from [facility] neurologic.  2.  I will plan on seeing the  patient back in the next 3 months, sooner should new neurologic issues arise.  Much greater than 50% of this visit was spent in counseling and coordinating care.  Total face to face time:  60 min   Cc:  Hoyt Koch, MD

## 2017-05-18 ENCOUNTER — Ambulatory Visit (INDEPENDENT_AMBULATORY_CARE_PROVIDER_SITE_OTHER): Payer: Medicare Other | Admitting: Neurology

## 2017-05-18 ENCOUNTER — Other Ambulatory Visit (HOSPITAL_COMMUNITY): Payer: Self-pay | Admitting: Internal Medicine

## 2017-05-18 ENCOUNTER — Encounter: Payer: Self-pay | Admitting: Psychology

## 2017-05-18 ENCOUNTER — Encounter: Payer: Self-pay | Admitting: Neurology

## 2017-05-18 VITALS — BP 130/70 | HR 84

## 2017-05-18 DIAGNOSIS — G231 Progressive supranuclear ophthalmoplegia [Steele-Richardson-Olszewski]: Secondary | ICD-10-CM | POA: Diagnosis not present

## 2017-05-18 DIAGNOSIS — R251 Tremor, unspecified: Secondary | ICD-10-CM | POA: Diagnosis not present

## 2017-05-18 DIAGNOSIS — R1319 Other dysphagia: Secondary | ICD-10-CM

## 2017-05-18 MED ORDER — CARBIDOPA-LEVODOPA 25-100 MG PO TABS: 2.0000 | ORAL_TABLET | Freq: Three times a day (TID) | ORAL | 4 refills | Status: DC

## 2017-05-18 NOTE — Patient Instructions (Addendum)
1. We have scheduled you at [facility] for your modified barium swallow on 05/28/17 at 11:00 am. Please arrive 15 minutes prior and go to 1st floor radiology. If you need to reschedule for any reason please call [phone].  2. We have sent a referral to [facility] for your DAT scan and they will call you directly to schedule your appt.. They are located at [address]. If you need to contact them directly please call [phone].  3. Increase Carbidopa Levodopa as follows:  Take two tablets in the morning, one tablet in the afternoon, one tablet in the evening for one week THEN Take two tablets in the morning, two tablets in the afternoon, one tablet in the evening for one week THEN Take two tablets three times daily and remain on this dose until next visit  4. Follow up scheduled for 3 months.

## 2017-05-18 NOTE — Progress Notes (Signed)
I met with Betty Chan and her daughter today while they were in the clinic. We talked a little about her current livings situation at a independent living facility with some paid in home help. However, the patient needs more assistance with transitioning with going to the restroom, etc. We talked a little about the [facility] facilities  in the area: [place], [place], [place] and [place]. The patient is reluctant to change, but we talked a little about how going to look  does not hurt. In addition, we talked a little about getting assistance in the areas needed (transfering)  will help to be continue to be independent and other areas. With the patient's diagnosis,  the [facility]rc would offer the opportunity to accommodate the patient as she experiences changes in her care needs.       The patient likes Bingo and participates in that activity at her current facility. It would be important for that activity to exist at the other facilities. They would look for a facility that have opportunities to try to engage her mother as much as possible. Her daughter reported that she feels that she would participate more in activities if she had help and assistance with getting to them.  I will do some research on different activities at these facilities and share them with both the patient and the daughter.   Since the daughter was interested in [place], I shared the [employer] as the social worker, Arnell Asal, would be willing to meet with them in the patient's home environment to talk to them more about the resources that were discussed today.    I also provided information on PSP from the [employer]. I will be sure to include the patient and her daughter in the caregiver group as soon as this is available.  I gave both the patient and her daughter a card with my contact information and encouraged them to contact me with any concerns, needs or support as they are journeying  through this diagnosis.

## 2017-05-26 NOTE — Telephone Encounter (Signed)
error 

## 2017-05-28 ENCOUNTER — Ambulatory Visit (HOSPITAL_COMMUNITY): Payer: Medicare Other

## 2017-05-28 ENCOUNTER — Telehealth: Payer: Self-pay | Admitting: Neurology

## 2017-05-28 ENCOUNTER — Inpatient Hospital Stay (HOSPITAL_COMMUNITY): Admission: RE | Admit: 2017-05-28 | Payer: Medicare Other | Source: Ambulatory Visit

## 2017-05-28 NOTE — Telephone Encounter (Signed)
-----   Message from April H Pait sent at 05/28/2017  2:47 PM EST ----- Regarding: RE: dat I have left 3 vm for her to call me.  She did leave me a vm once, but I can't reach her.  I will be on vacation next Tuesday thru the following Monday.  If any of your patients that I have called need to be scheduled, please call [facility] directly at [phone] ----- Message ----- From: Annamaria Helling, CMA Sent: 05/18/2017  11:29 AM To: April H Pait Subject: dat                                            Please schedule DAT scan   Thanks,   Luvenia Starch

## 2017-06-12 ENCOUNTER — Telehealth: Payer: Self-pay | Admitting: Neurology

## 2017-06-12 NOTE — Telephone Encounter (Signed)
This is to document review of notes from [facility] neurologic, Dr. Berdine Addison.  Patient had a fairly extensive workup with Dr. Berdine Addison.  In September, 2017 the patient underwent EMG/nerve conduction study demonstrating severe sensorimotor peripheral neuropathy with axonal and demyelinating features with conduction block.  He was concerned about the possibility of CIDP.  Therefore, the patient underwent a lumbar puncture at the end of September, 2017.  CSF protein was only 49.  CSF glucose was 59.  She did have acetylcholine receptor antibodies that were normal.  CSF cytology did not reveal any malignant cells.  Vitamin B6 was normal.  TSH was normal at 1.77.  ANA was positive at 1: 160.  Hemoglobin A1c was 6.2.  There was no monoclonal proteins in the serum.  Vitamin D was unremarkable.  Vitamin B12 was 679.

## 2017-06-13 ENCOUNTER — Other Ambulatory Visit: Payer: Self-pay | Admitting: Internal Medicine

## 2017-06-15 ENCOUNTER — Other Ambulatory Visit: Payer: Self-pay | Admitting: Internal Medicine

## 2017-06-15 NOTE — Telephone Encounter (Signed)
LOV on 08/25/2016 with Dr. Hulan Saas Refill request for lisinopril / Last refill says annual is due in January for further refills / No appointment scheduled / Will route to provider for review

## 2017-06-15 NOTE — Telephone Encounter (Signed)
Copied from [place] [phone] [id number]. Topic: General - Other >> Jun 15, 2017 11:25 AM Darl Householder, RMA wrote: Reason for CRM: medication refill request for lisinopril 5 mg to be sent to [facility]

## 2017-06-19 NOTE — Telephone Encounter (Signed)
Tried to call pt. Line would pick up and then hang up. Not able to get in touch with anyone. Pt will need to schedule an appt before we will be able to send in enough rx to get pt to that appt.

## 2017-07-14 ENCOUNTER — Ambulatory Visit (HOSPITAL_COMMUNITY)
Admission: RE | Admit: 2017-07-14 | Discharge: 2017-07-14 | Disposition: A | Discharge status: Home / Self Care | Payer: Medicare Other | Source: Ambulatory Visit | Attending: Neurology | Admitting: Neurology

## 2017-07-14 ENCOUNTER — Ambulatory Visit (HOSPITAL_COMMUNITY): Payer: Medicare Other

## 2017-07-14 ENCOUNTER — Ambulatory Visit (HOSPITAL_COMMUNITY)
Admission: RE | Admit: 2017-07-14 | Discharge: 2017-07-14 | Disposition: A | Discharge status: Home / Self Care | Payer: Medicare Other | Source: Ambulatory Visit | Attending: Internal Medicine | Admitting: Internal Medicine

## 2017-07-14 DIAGNOSIS — R1319 Other dysphagia: Secondary | ICD-10-CM | POA: Diagnosis present

## 2017-07-16 ENCOUNTER — Telehealth: Payer: Self-pay | Admitting: Neurology

## 2017-07-16 NOTE — Telephone Encounter (Signed)
Mychart message sent to patient.

## 2017-07-16 NOTE — Telephone Encounter (Signed)
-----   Message from Vance, DO sent at 07/16/2017  7:42 AM EST ----- MBE recommended ST for swallowing as outpt.  Would she like to do that?  Could probably do at home since she doesn't drive and lives in facility (independently) I think

## 2017-07-16 NOTE — Telephone Encounter (Signed)
Left message on machine for patient to call back.

## 2017-08-27 ENCOUNTER — Other Ambulatory Visit: Payer: Self-pay | Admitting: Family Medicine

## 2017-08-28 ENCOUNTER — Ambulatory Visit: Payer: Medicare Other | Admitting: Neurology

## 2017-08-28 NOTE — Telephone Encounter (Signed)
Refill denied. Pt has not been seen within a year.  

## 2017-09-11 ENCOUNTER — Other Ambulatory Visit: Payer: Self-pay | Admitting: Neurology

## 2017-10-20 ENCOUNTER — Other Ambulatory Visit: Payer: Self-pay | Admitting: Neurology

## 2017-10-21 ENCOUNTER — Telehealth: Payer: Self-pay | Admitting: Neurology

## 2017-10-21 MED ORDER — CARBIDOPA-LEVODOPA 25-100 MG PO TABS: 2.0000 | ORAL_TABLET | Freq: Three times a day (TID) | ORAL | 2 refills | Status: DC

## 2017-10-21 NOTE — Telephone Encounter (Signed)
Pts daughter called and asked for refill of carbidopa/levodopa 25/100 2 po tid.  Told her I could fill 90 day supply but she needed to be seen as a patient.  Told her she needed to call us today for appt

## 2017-11-27 ENCOUNTER — Encounter (HOSPITAL_COMMUNITY): Payer: Self-pay

## 2017-11-27 ENCOUNTER — Emergency Department (HOSPITAL_COMMUNITY)
Admission: EM | Admit: 2017-11-27 | Discharge: 2017-11-27 | Disposition: A | Discharge status: Skilled Nursing Facility | Payer: Medicare Other | Attending: Emergency Medicine | Admitting: Emergency Medicine

## 2017-11-27 ENCOUNTER — Other Ambulatory Visit: Payer: Self-pay

## 2017-11-27 DIAGNOSIS — Z853 Personal history of malignant neoplasm of breast: Secondary | ICD-10-CM | POA: Insufficient documentation

## 2017-11-27 DIAGNOSIS — Z23 Encounter for immunization: Secondary | ICD-10-CM | POA: Diagnosis not present

## 2017-11-27 DIAGNOSIS — Y92122 Bedroom in nursing home as the place of occurrence of the external cause: Secondary | ICD-10-CM | POA: Insufficient documentation

## 2017-11-27 DIAGNOSIS — T148XXA Other injury of unspecified body region, initial encounter: Secondary | ICD-10-CM

## 2017-11-27 DIAGNOSIS — S61401A Unspecified open wound of right hand, initial encounter: Secondary | ICD-10-CM | POA: Insufficient documentation

## 2017-11-27 DIAGNOSIS — Z043 Encounter for examination and observation following other accident: Secondary | ICD-10-CM | POA: Diagnosis present

## 2017-11-27 DIAGNOSIS — Y939 Activity, unspecified: Secondary | ICD-10-CM | POA: Diagnosis not present

## 2017-11-27 DIAGNOSIS — W06XXXA Fall from bed, initial encounter: Secondary | ICD-10-CM | POA: Diagnosis not present

## 2017-11-27 DIAGNOSIS — S51002A Unspecified open wound of left elbow, initial encounter: Secondary | ICD-10-CM | POA: Insufficient documentation

## 2017-11-27 DIAGNOSIS — I10 Essential (primary) hypertension: Secondary | ICD-10-CM | POA: Diagnosis not present

## 2017-11-27 DIAGNOSIS — Y999 Unspecified external cause status: Secondary | ICD-10-CM | POA: Insufficient documentation

## 2017-11-27 DIAGNOSIS — S60221A Contusion of right hand, initial encounter: Secondary | ICD-10-CM | POA: Diagnosis not present

## 2017-11-27 DIAGNOSIS — Z79899 Other long term (current) drug therapy: Secondary | ICD-10-CM | POA: Insufficient documentation

## 2017-11-27 DIAGNOSIS — S71101A Unspecified open wound, right thigh, initial encounter: Secondary | ICD-10-CM | POA: Diagnosis not present

## 2017-11-27 MED ORDER — TETANUS-DIPHTH-ACELL PERTUSSIS 5-2.5-18.5 LF-MCG/0.5 IM SUSP
0.5000 mL | Freq: Once | INTRAMUSCULAR | Status: AC
  Administered 2017-11-27: 0.5 mL via INTRAMUSCULAR
  Filled 2017-11-27: qty 0.5

## 2017-11-27 NOTE — ED Triage Notes (Signed)
Patient BIB EMS for a fall at [employer]. Patient hit her chin and suffered skin tears to her arms and leg.

## 2017-11-27 NOTE — ED Notes (Signed)
Bed: UY29 Expected date:  Expected time:  Means of arrival:  Comments: 74F fall from SNF

## 2017-11-27 NOTE — ED Provider Notes (Signed)
[facility] DEPT Provider Note: Betty Spurling, MD, FACEP  CSN: [id number] MRN: [id number] ARRIVAL: 11/27/17 at [place]: [place]   HISTORY OF PRESENT ILLNESS  11/27/17 5:39 AM Betty Chan is a 84 y.o. female who fell out of bed just prior to arrival.  She remembers the fall and denies loss of consciousness.  She denies significant pain but does have skin tears to her dorsal right hand, left elbow and right thigh.    Past Medical History:  Diagnosis Date  . Aortic stenosis    s/p TAVR 08/2013 -follows at [facility] cards Mina Marble)  . Basal cell carcinoma   . Cancer of right breast [facility]) 2004   lumpectomy  . Dyslipidemia   . GERD (gastroesophageal reflux disease)   . Heart murmur   . Hypertension   . Hypothyroid   . OA (osteoarthritis) of knee    "right" (11/24/2014)  . Osteopenia 02/13/2014   DEXA 01/2014: -2.2  . PHN (postherpetic neuralgia)    L anterior thigh 2007, recurrent 04/2013  . Seizure ([place]) 2007   single event, off Keppra since 11/2013; "not sure if it was a TIA or seizure"  . Squamous cell cancer of skin of forearm   . TIA (transient ischemic attack) 2007   "not sure if it was a TIA or seizure"    Past Surgical History:  Procedure Laterality Date  . BASAL CELL CARCINOMA EXCISION     sternum  . BREAST BIOPSY Right 2004  . BREAST LUMPECTOMY Right 08/2002 X 2  . CARDIAC CATHETERIZATION  "several"  . CARDIAC VALVE REPLACEMENT    . CATARACT EXTRACTION W/ INTRAOCULAR LENS  IMPLANT, BILATERAL Bilateral 03/2014-07/2014  . SQUAMOUS CELL CARCINOMA EXCISION Left 06/2014   'forearm"  . TONSILLECTOMY    . TRANSCATHETER AORTIC VALVE REPLACEMENT, TRANSFEMORAL  09/06/13   [facility]    Family History  Problem Relation Age of Onset  . Prostate cancer Father   . Breast cancer Sister   . Stroke Mother 100  . Dementia Sister     Social History   Tobacco Use  . Smoking status: Never Smoker  . Smokeless tobacco: Never Used  . Tobacco comment:  independent living with assistance  Substance Use Topics  . Alcohol use: No    Frequency: Never  . Drug use: No    Prior to Admission medications   Medication Sig Start Date End Date Taking? Authorizing Provider  acetaminophen (TYLENOL) 500 MG tablet Take 1 tablet (500 mg total) by mouth every 6 (six) hours as needed for fever. Patient taking differently: Take 500 mg by mouth every 8 (eight) hours.  06/26/14   Rowe Clack, MD  aspirin 81 MG tablet Take 81 mg by mouth daily.    [provider]  atorvastatin (LIPITOR) 20 MG tablet TAKE 1 TABLET BY MOUTH EVERY DAY AT 6 PM 07/25/14   Rowe Clack, MD  Biotin 5000 MCG CAPS Take 5,000 mcg by mouth daily.    [provider]  carbidopa-levodopa (SINEMET IR) 25-100 MG tablet Take 2 tablets by mouth 3 (three) times daily. 10/21/17   Tat, Eustace Quail, DO  cholecalciferol (VITAMIN D) 1000 UNITS tablet Take 1,000 Units by mouth 2 (two) times daily.     [provider]  clindamycin (CLEOCIN) 150 MG capsule Take 600 mg by mouth as needed (1 hour prior to dental appointment).  11/07/14   [provider]  diclofenac sodium (VOLTAREN) 1 % GEL APPLY 2  GRAMS TOPICALLY 3 (THREE) TIMES DAILY. 04/13/17   Lyndal Pulley, DO  glucosamine-chondroitin 500-400 MG tablet Take 1 tablet by mouth 2 (two) times daily.    [provider]  levothyroxine (SYNTHROID, LEVOTHROID) 50 MCG tablet Take 1 tablet (50 mcg total) by mouth daily before breakfast. 03/28/14   Rowe Clack, MD  lisinopril (PRINIVIL,ZESTRIL) 5 MG tablet Take 1 tablet (5 mg total) by mouth daily. Annual visit due in Jan for further refills 03/10/17   Hoyt Koch, MD  metoprolol tartrate (LOPRESSOR) 25 MG tablet Take 0.5 tablets (12.5 mg total) by mouth 2 (two) times daily. 02/24/17   Hoyt Koch, MD  mirabegron ER (MYRBETRIQ) 50 MG TB24 tablet Take 50 mg by mouth daily.    [provider]  Multiple Vitamins-Minerals (CENTRUM  FLAVOR BURST ADULT) CHEW Chew 2 tablets by mouth daily.    [provider]  omeprazole (PRILOSEC) 20 MG capsule Take 1 capsule (20 mg total) by mouth daily. 03/28/14   Rowe Clack, MD  oxybutynin (DITROPAN) 5 MG tablet Take 2 tablets (10 mg total) by mouth 2 (two) times daily. 01/17/16   Hoyt Koch, MD  sertraline (ZOLOFT) 50 MG tablet Take 50 mg by mouth daily.    [provider]  TURMERIC PO Take 1 tablet by mouth daily.    [provider]    Allergies Amoxicillin and Penicillins   REVIEW OF SYSTEMS  Negative except as noted here or in the History of Present Illness.   PHYSICAL EXAMINATION  Initial Vital Signs Blood pressure 133/69, pulse 80, temperature 98 F (36.7 C), temperature source Axillary, resp. rate 20, SpO2 98 %.  Examination General: Well-developed, well-nourished female in no acute distress; appearance consistent with age of record HENT: normocephalic; atraumatic Eyes: pupils equal, round and reactive to light; extraocular muscles intact Neck: supple; nontender Heart: regular rate and rhythm Lungs: clear to auscultation bilaterally Abdomen: soft; nondistended; nontender; bowel sounds present Extremities: Arthritic changes; no acute deformity; ecchymosis dorsal right hand without tenderness; pulses normal Neurologic: Somnolent but readily awakened; oriented x 3; motor function intact in all extremities and symmetric; no facial droop Skin: Warm and dry; skin tears to left elbow, dorsal right hand and anteromedial right thigh Psychiatric: Normal mood and affect   RESULTS  Summary of this visit's results, reviewed by myself:   EKG Interpretation  Date/Time:    Ventricular Rate:    PR Interval:    QRS Duration:   QT Interval:    QTC Calculation:   R Axis:     Text Interpretation:        Laboratory Studies: No results found for this or any previous visit (from the past 24 hour(s)). Imaging Studies: No results  found.  ED COURSE and MDM  Nursing notes and initial vitals signs, including pulse oximetry, reviewed.  Vitals:   11/27/17 0458  BP: 133/69  Pulse: 80  Resp: 20  Temp: 98 F (36.7 C)  TempSrc: Axillary  SpO2: 98%   The patient does not appear to have any bony injuries on examination and it did drop believe any radiographs are indicated at this time.  We will provide local wound care.  PROCEDURES    ED DIAGNOSES     ICD-10-CM   1. Fall from bed, initial encounter W06.XXXA   2. Multiple skin tears T14.8XXA   3. Contusion of right hand, initial encounter T26.712W        Shanon Rosser, MD 11/27/17 [phone]

## 2018-01-11 ENCOUNTER — Other Ambulatory Visit: Payer: Self-pay | Admitting: Neurology

## 2018-03-02 ENCOUNTER — Other Ambulatory Visit: Payer: Self-pay | Admitting: Family Medicine

## 2018-03-02 ENCOUNTER — Ambulatory Visit
Admission: RE | Admit: 2018-03-02 | Discharge: 2018-03-02 | Disposition: A | Discharge status: Home / Self Care | Payer: Medicare Other | Source: Ambulatory Visit | Attending: Family Medicine | Admitting: Family Medicine

## 2018-03-02 DIAGNOSIS — L089 Local infection of the skin and subcutaneous tissue, unspecified: Secondary | ICD-10-CM

## 2018-03-15 ENCOUNTER — Ambulatory Visit (INDEPENDENT_AMBULATORY_CARE_PROVIDER_SITE_OTHER): Payer: Medicare Other

## 2018-03-15 ENCOUNTER — Encounter: Payer: Self-pay | Admitting: Podiatry

## 2018-03-15 ENCOUNTER — Ambulatory Visit (INDEPENDENT_AMBULATORY_CARE_PROVIDER_SITE_OTHER): Payer: Medicare Other | Admitting: Podiatry

## 2018-03-15 ENCOUNTER — Other Ambulatory Visit: Payer: Self-pay | Admitting: Podiatry

## 2018-03-15 VITALS — BP 112/59 | HR 74

## 2018-03-15 DIAGNOSIS — M869 Osteomyelitis, unspecified: Secondary | ICD-10-CM

## 2018-03-15 DIAGNOSIS — M79674 Pain in right toe(s): Secondary | ICD-10-CM

## 2018-03-17 NOTE — Progress Notes (Signed)
Subjective:   Patient ID: Betty Chan, female   DOB: 84 y.o.   MRN: [id number]   HPI 84 year old female presents the office with her daughter for concerns of right big toe redness, swelling.  She states the toenail itself is painful on the edges.  She is on her second round of antibiotics and since this the redness and swelling has much improved. She previously did have an x-ray but was inconclusive.  She states that she has no pain in the area and she has no other concerns.  Review of Systems  All other systems reviewed and are negative.  Past Medical History:  Diagnosis Date  . Aortic stenosis    s/p TAVR 08/2013 -follows at [facility] cards Mina Marble)  . Basal cell carcinoma   . Cancer of right breast [facility]) 2004   lumpectomy  . Dyslipidemia   . GERD (gastroesophageal reflux disease)   . Heart murmur   . Hypertension   . Hypothyroid   . OA (osteoarthritis) of knee    "right" (11/24/2014)  . Osteopenia 02/13/2014   DEXA 01/2014: -2.2  . PHN (postherpetic neuralgia)    L anterior thigh 2007, recurrent 04/2013  . Seizure ([place]) 2007   single event, off Keppra since 11/2013; "not sure if it was a TIA or seizure"  . Squamous cell cancer of skin of forearm   . TIA (transient ischemic attack) 2007   "not sure if it was a TIA or seizure"    Past Surgical History:  Procedure Laterality Date  . BASAL CELL CARCINOMA EXCISION     sternum  . BREAST BIOPSY Right 2004  . BREAST LUMPECTOMY Right 08/2002 X 2  . CARDIAC CATHETERIZATION  "several"  . CARDIAC VALVE REPLACEMENT    . CATARACT EXTRACTION W/ INTRAOCULAR LENS  IMPLANT, BILATERAL Bilateral 03/2014-07/2014  . SQUAMOUS CELL CARCINOMA EXCISION Left 06/2014   'forearm"  . TONSILLECTOMY    . TRANSCATHETER AORTIC VALVE REPLACEMENT, TRANSFEMORAL  09/06/13   [facility]     Current Outpatient Medications:  .  acetaminophen (TYLENOL) 500 MG tablet, Take 1 tablet (500 mg total) by mouth every 6 (six) hours as needed for fever. (Patient taking  differently: Take 500 mg by mouth every 8 (eight) hours. ), Disp: 30 tablet, Rfl: 0 .  aspirin 81 MG tablet, Take 81 mg by mouth daily., Disp: , Rfl:  .  atorvastatin (LIPITOR) 20 MG tablet, TAKE 1 TABLET BY MOUTH EVERY DAY AT 6 PM, Disp: 90 tablet, Rfl: 3 .  Biotin 5000 MCG CAPS, Take 5,000 mcg by mouth daily., Disp: , Rfl:  .  carbidopa-levodopa (SINEMET IR) 25-100 MG tablet, Take 2 tablets by mouth 3 (three) times daily., Disp: 180 tablet, Rfl: 2 .  cholecalciferol (VITAMIN D) 1000 UNITS tablet, Take 1,000 Units by mouth 2 (two) times daily. , Disp: , Rfl:  .  clindamycin (CLEOCIN) 150 MG capsule, Take 600 mg by mouth as needed (1 hour prior to dental appointment). , Disp: , Rfl: 0 .  diclofenac sodium (VOLTAREN) 1 % GEL, APPLY 2 GRAMS TOPICALLY 3 (THREE) TIMES DAILY., Disp: 100 g, Rfl: 1 .  glucosamine-chondroitin 500-400 MG tablet, Take 1 tablet by mouth 2 (two) times daily., Disp: , Rfl:  .  levothyroxine (SYNTHROID, LEVOTHROID) 50 MCG tablet, Take 1 tablet (50 mcg total) by mouth daily before breakfast., Disp: 90 tablet, Rfl: 1 .  lisinopril (PRINIVIL,ZESTRIL) 5 MG tablet, Take 1 tablet (5 mg total) by mouth daily. Annual visit due in Jan for further refills,  Disp: 90 tablet, Rfl: 0 .  metoprolol tartrate (LOPRESSOR) 25 MG tablet, Take 0.5 tablets (12.5 mg total) by mouth 2 (two) times daily., Disp: 30 tablet, Rfl: 0 .  mirabegron ER (MYRBETRIQ) 50 MG TB24 tablet, Take 50 mg by mouth daily., Disp: , Rfl:  .  Multiple Vitamins-Minerals (CENTRUM FLAVOR BURST ADULT) CHEW, Chew 2 tablets by mouth daily., Disp: , Rfl:  .  omeprazole (PRILOSEC) 20 MG capsule, Take 1 capsule (20 mg total) by mouth daily., Disp: 90 capsule, Rfl: 1 .  oxybutynin (DITROPAN) 5 MG tablet, Take 2 tablets (10 mg total) by mouth 2 (two) times daily., Disp: 120 tablet, Rfl: 6 .  sertraline (ZOLOFT) 50 MG tablet, Take 50 mg by mouth daily., Disp: , Rfl:  .  TURMERIC PO, Take 1 tablet by mouth daily., Disp: , Rfl:    Allergies  Allergen Reactions  . Amoxicillin Rash  . Penicillins Rash       Objective:  Physical Exam  General: AAO x3, NAD  Dermatological: There is incurvation to both the medial, lateral, distal portion of the toenail causing pain.  There is edema erythema to the hallux extending from the MPJ distally.  There is no significant increase in warmth and compared to the pictures that her daughter show me this is much improved.  There is no open sore to the toe and there is no drainage or pus coming from the toenail site itself.  Vascular: Dorsalis to both Pedis artery and Posterior Tibial artery pedal pulses are 2/4 bilateral with immedate capillary fill time. There is no pain with calf compression, swelling, warmth, erythema.   Neruologic: Grossly intact via light touch bilateral. Protective threshold with Semmes Wienstein monofilament intact to all pedal sites bilateral.   Musculoskeletal: No gross boney pedal deformities bilateral. No pain, crepitus, or limitation noted with foot and ankle range of motion bilateral. Muscular strength 5/5 in all groups tested bilateral.  Gait: Unassisted, Nonantalgic.       Assessment:   Cellulitis right hallux with ingrown toenail     Plan:  -Treatment options discussed including all alternatives, risks, and complications -Etiology of symptoms were discussed -X-rays were obtained and reviewed with the patient.  Difficult to evaluate but compared to the previous x-ray does not appear to be significantly changed. -I debrided the hallux toenail and complications of bleeding.  There is no signs of infection of the toenail other than the redness and swelling there is no drainage or pus.  On her continue the antibiotics it is improving.  Joint order CBC, ESR, CRP as well.  Return in about 2 weeks (around 03/29/2018). X-ray next appointment   Trula Slade DPM

## 2018-03-29 ENCOUNTER — Telehealth: Payer: Self-pay | Admitting: *Deleted

## 2018-03-29 DIAGNOSIS — M869 Osteomyelitis, unspecified: Secondary | ICD-10-CM

## 2018-03-29 DIAGNOSIS — M79674 Pain in right toe(s): Secondary | ICD-10-CM

## 2018-03-29 NOTE — Telephone Encounter (Signed)
She has been on 3 rounds of antibiotics I believe. I would like to see it tomorrow before going back on it. Also if she could get the blood work done tomorrow that would be great. Can you add a basic metabolic panel to the lab order? Thanks.

## 2018-03-29 NOTE — Telephone Encounter (Signed)
Pt's dtr, [name] states she will have to cancel tomorrow's appt, they have not been able to get pt's blood work, and pt has been off the antibiotic a day or two and they would like to reorder.

## 2018-03-29 NOTE — Telephone Encounter (Signed)
I called [name] and asked if she could keep the appt tomorrow and have the blood work drawn after pt was seen by DR. Jacqualyn Posey, because it was important for [place] to take care of the toe. [name] states pt has been off the antibiotic a day or two and the toe is red. [name] states she will need a later appt tomorrow and I transferred to schedulers.

## 2018-03-29 NOTE — Telephone Encounter (Signed)
Comprehensive metabolic panel added.

## 2018-03-30 ENCOUNTER — Ambulatory Visit (INDEPENDENT_AMBULATORY_CARE_PROVIDER_SITE_OTHER): Payer: Medicare Other

## 2018-03-30 ENCOUNTER — Ambulatory Visit (INDEPENDENT_AMBULATORY_CARE_PROVIDER_SITE_OTHER): Payer: Medicare Other | Admitting: Podiatry

## 2018-03-30 DIAGNOSIS — M869 Osteomyelitis, unspecified: Secondary | ICD-10-CM | POA: Diagnosis not present

## 2018-03-30 DIAGNOSIS — L02611 Cutaneous abscess of right foot: Secondary | ICD-10-CM | POA: Diagnosis not present

## 2018-03-30 DIAGNOSIS — L03031 Cellulitis of right toe: Secondary | ICD-10-CM

## 2018-03-30 DIAGNOSIS — M79674 Pain in right toe(s): Secondary | ICD-10-CM | POA: Diagnosis not present

## 2018-03-30 MED ORDER — DOXYCYCLINE HYCLATE 100 MG PO TABS: 100.0000 mg | ORAL_TABLET | Freq: Two times a day (BID) | ORAL | 0 refills | Status: DC

## 2018-04-08 NOTE — Progress Notes (Signed)
Subjective: 84 year old female present to the office today for follow-up evaluation of left toe cellulitis and for potential osteomyelitis.  Overall her daughter states that is looking better.  She did call think it was looking worse but this was due to a picture and this is after the venous came on.  She states that it actually looks better than it has.  Patient states the pain is improved in the left side is not having any pain.  She has finished her antibiotics.  She has no other concerns today.  No drainage or pus.  Denies any systemic complaints such as fevers, chills, nausea, vomiting. No acute changes since last appointment, and no other complaints at this time.   Objective: AAO x3, NAD DP/PT pulses palpable bilaterally, CRT less than 3 seconds There is decrease edema erythema to the right toe and there is no open lesion identified at this time there is no interdigital lesions or maceration.  The tenderness that she has had along the toenail has resolved and there is no drainage or pus coming from the toenail itself.  No significant increase in warmth of the toe.  No open lesions or pre-ulcerative lesions.  No pain with calf compression, swelling, warmth, erythema  Assessment: Improving cellulitis right hallux  Plan: -All treatment options discussed with the patient including all alternatives, risks, complications.  -Repeat x-rays were obtained reviewed although difficult to evaluate the distal portion of the toe there is no significant changes that I can identify today. -However given her continued redness although improving we will continue antibiotics and I refilled the doxycycline today. -She has not yet had the blood work done she is also prescription for this I did advise to get this done. -Monitor for any clinical signs or symptoms of infection and directed to call the office immediately should any occur or go to the ER. -Patient encouraged to call the office with any questions,  concerns, change in symptoms.   Return in about 3 weeks (around 04/20/2018).  Trula Slade DPM

## 2018-04-12 LAB — COMPREHENSIVE METABOLIC PANEL
AG RATIO: 1.4 (calc) (ref 1.0–2.5)
ALBUMIN MSPROF: 3.7 g/dL (ref 3.6–5.1)
ALT: 7 U/L (ref 6–29)
AST: 21 U/L (ref 10–35)
Alkaline phosphatase (APISO): 58 U/L (ref 33–130)
BUN/Creatinine Ratio: 25 (calc) — ABNORMAL HIGH (ref 6–22)
BUN: 32 mg/dL — ABNORMAL HIGH (ref 7–25)
CHLORIDE: 102 mmol/L (ref 98–110)
CO2: 30 mmol/L (ref 20–32)
Calcium: 9.2 mg/dL (ref 8.6–10.4)
Creat: 1.28 mg/dL — ABNORMAL HIGH (ref 0.60–0.88)
GLOBULIN: 2.7 g/dL (ref 1.9–3.7)
GLUCOSE: 131 mg/dL (ref 65–139)
POTASSIUM: 3.9 mmol/L (ref 3.5–5.3)
SODIUM: 139 mmol/L (ref 135–146)
TOTAL PROTEIN: 6.4 g/dL (ref 6.1–8.1)
Total Bilirubin: 0.5 mg/dL (ref 0.2–1.2)

## 2018-04-14 ENCOUNTER — Telehealth: Payer: Self-pay | Admitting: *Deleted

## 2018-04-14 DIAGNOSIS — M869 Osteomyelitis, unspecified: Secondary | ICD-10-CM

## 2018-04-14 DIAGNOSIS — L02611 Cutaneous abscess of right foot: Secondary | ICD-10-CM

## 2018-04-14 DIAGNOSIS — M79674 Pain in right toe(s): Secondary | ICD-10-CM

## 2018-04-14 DIAGNOSIS — L03031 Cellulitis of right toe: Secondary | ICD-10-CM

## 2018-04-14 NOTE — Telephone Encounter (Signed)
-----  Message from Trula Slade, DPM sent at 04/14/2018  8:35 AM EDT ----- Val- I ordered a CMP, CBC, ESR, CRP abut only see a result for a CMP. Can you please call to see the status of the other labs. Doesn't look like it was drawn. Thanks.

## 2018-04-15 NOTE — Telephone Encounter (Signed)
Mailed copy of labs ordered 04/14/2018 and A1C ordered 04/15/2018, in case pt and dtr would like to take to the lab.

## 2018-04-15 NOTE — Telephone Encounter (Signed)
Pt's dtr, Betty Chan called for results. I explained that only a Comprehensive Metabolic Panel had been drawn and Dr. Jacqualyn Posey had ordered the 3 test that had been ordered but not drawn. Betty Chan states she is not happy they have seen 4 doctors and gone to the lab multiple times because they were closed. I told pt Dr. Jacqualyn Posey had put in the orders, but only the CMP showed in the orders. Betty Chan asked if the 3 remaining labs needed to be drawn. I told Betty Chan 2 would show if pt had inflammation and the CBC with Diff would show blood cell type and amount, like if pt had an infection or had enough proper blood cells to fight an infection. Betty Chan asked did the lab that was drawn not show the blood cell, and I explained that it showed chemistries, like kidney and liver function not actual cells, which may tell us if for example her kidney are okay enough to take an antibiotic.  Betty Chan states she doesn't know whose fault it is but she is not happy. I explained, on occasion orders that are entered in the computer may not show up a the lab, example 5 orders put in at 8:00am, 4 show up in the lab computer at 8:00am, pt has the labs drawn between 8:00am and 5:00pm, but the 5th shows up in the lab computer at 5:00pm so the 5th lab is not drawn. Betty Chan asked for results of the CMP. I explained that pt did have an increase in her kidney and pt's glucose was high. I asked if pt was fasting and Betty Chan stated no pt was not told to fast. I told her as a routine, pt's are not asked to fast. Betty Chan states she would like a glucose to be drawn, I told her I would inform Dr. Jacqualyn Posey. Betty Chan asked if the order would be with the other labs and I told her I felt he would order a glucose check. Dr. Jacqualyn Posey states order A1C.

## 2018-04-16 NOTE — Telephone Encounter (Signed)
Let her know I apologize. I had the labs ordered so not sure what happened.

## 2018-04-20 ENCOUNTER — Ambulatory Visit: Payer: Medicare Other | Admitting: Podiatry

## 2018-04-20 LAB — CBC WITH DIFFERENTIAL/PLATELET
BASOS ABS: 32 {cells}/uL (ref 0–200)
Basophils Relative: 0.5 %
Eosinophils Absolute: 120 cells/uL (ref 15–500)
Eosinophils Relative: 1.9 %
HCT: 38.3 % (ref 35.0–45.0)
Hemoglobin: 12.8 g/dL (ref 11.7–15.5)
Lymphs Abs: 1021 cells/uL (ref 850–3900)
MCH: 32.5 pg (ref 27.0–33.0)
MCHC: 33.4 g/dL (ref 32.0–36.0)
MCV: 97.2 fL (ref 80.0–100.0)
MONOS PCT: 8.7 %
MPV: 10.4 fL (ref 7.5–12.5)
Neutro Abs: 4580 cells/uL (ref 1500–7800)
Neutrophils Relative %: 72.7 %
PLATELETS: 235 10*3/uL (ref 140–400)
RBC: 3.94 10*6/uL (ref 3.80–5.10)
RDW: 11.9 % (ref 11.0–15.0)
TOTAL LYMPHOCYTE: 16.2 %
WBC: 6.3 10*3/uL (ref 3.8–10.8)
WBCMIX: 548 {cells}/uL (ref 200–950)

## 2018-04-20 LAB — C-REACTIVE PROTEIN: CRP: 0.6 mg/L (ref <8.0)

## 2018-04-20 LAB — HEMOGLOBIN A1C
Hgb A1c MFr Bld: 6.5 % of total Hgb — ABNORMAL HIGH (ref <5.7)
MEAN PLASMA GLUCOSE: 140 (calc)
eAG (mmol/L): 7.7 (calc)

## 2018-04-20 LAB — SEDIMENTATION RATE: Sed Rate: 25 mm/h (ref 0–30)

## 2018-04-21 ENCOUNTER — Telehealth: Payer: Self-pay | Admitting: *Deleted

## 2018-04-21 NOTE — Telephone Encounter (Signed)
Pt's dtr, Karen's phone rang for over one minute without answering service.

## 2018-04-21 NOTE — Telephone Encounter (Signed)
Left message on pt's phone requesting a call back to discuss labs.

## 2018-04-21 NOTE — Telephone Encounter (Signed)
Betty Chan - Dr. Nathanial Millman office states pt is established with Dr. Sharlet Salina. Faxed lab results to Dr. Sharlet Salina.

## 2018-04-21 NOTE — Telephone Encounter (Signed)
Thanks. Can you please send to Dr. Justin Mend?

## 2018-04-21 NOTE — Telephone Encounter (Signed)
-----   Message from Trula Slade, DPM sent at 04/20/2018  6:03 PM EDT ----- Val- please let her know that her infection markers were normal. Her A1c is elevated at 6.5. I will send this to her PCP. Thanks.

## 2018-04-21 NOTE — Telephone Encounter (Signed)
-----   Message from Trula Slade, DPM sent at 04/20/2018  6:06 PM EDT ----- Tivis Ringer- can you please send all blood to her PCP to make sure they are aware. Thanks.

## 2018-04-21 NOTE — Telephone Encounter (Deleted)
-----   Message from Trula Slade, DPM sent at 04/20/2018  6:06 PM EDT ----- Tivis Ringer- can you please send all blood to her PCP to make sure they are aware. Thanks.

## 2018-04-21 NOTE — Telephone Encounter (Signed)
I informed pt's dtr, [name] of the results and that I had faxed the results to Dr. Sharlet Salina. [name] states pt is not longer with Dr. Sharlet Salina, and see Dr. Maurice Small at [phone].

## 2018-04-21 NOTE — Telephone Encounter (Signed)
My mom is waiting to hear about blood work. If someone could call me back with those results at [phone].

## 2018-04-22 NOTE — Telephone Encounter (Signed)
I called Ddr. Jason Nest office for fax number. Faxed labs 04/19/2018 to Dr. Maurice Small.

## 2020-01-17 ENCOUNTER — Other Ambulatory Visit: Payer: Self-pay

## 2020-01-17 ENCOUNTER — Emergency Department (HOSPITAL_COMMUNITY): Payer: Medicare Other

## 2020-01-17 ENCOUNTER — Encounter (HOSPITAL_COMMUNITY): Payer: Self-pay

## 2020-01-17 ENCOUNTER — Inpatient Hospital Stay (HOSPITAL_COMMUNITY)
Admission: EM | Admit: 2020-01-17 | Discharge: 2020-01-31 | DRG: 177 | Disposition: A | Discharge status: Skilled Nursing Facility | Payer: Medicare Other | Source: Skilled Nursing Facility | Attending: Family Medicine | Admitting: Family Medicine

## 2020-01-17 DIAGNOSIS — R778 Other specified abnormalities of plasma proteins: Secondary | ICD-10-CM | POA: Diagnosis not present

## 2020-01-17 DIAGNOSIS — R531 Weakness: Secondary | ICD-10-CM

## 2020-01-17 DIAGNOSIS — I5023 Acute on chronic systolic (congestive) heart failure: Secondary | ICD-10-CM | POA: Diagnosis not present

## 2020-01-17 DIAGNOSIS — J9601 Acute respiratory failure with hypoxia: Secondary | ICD-10-CM

## 2020-01-17 DIAGNOSIS — E7800 Pure hypercholesterolemia, unspecified: Secondary | ICD-10-CM | POA: Diagnosis not present

## 2020-01-17 DIAGNOSIS — Z79899 Other long term (current) drug therapy: Secondary | ICD-10-CM

## 2020-01-17 DIAGNOSIS — K219 Gastro-esophageal reflux disease without esophagitis: Secondary | ICD-10-CM | POA: Diagnosis present

## 2020-01-17 DIAGNOSIS — I10 Essential (primary) hypertension: Secondary | ICD-10-CM | POA: Diagnosis present

## 2020-01-17 DIAGNOSIS — Z952 Presence of prosthetic heart valve: Secondary | ICD-10-CM

## 2020-01-17 DIAGNOSIS — Z20822 Contact with and (suspected) exposure to covid-19: Secondary | ICD-10-CM | POA: Diagnosis present

## 2020-01-17 DIAGNOSIS — Z853 Personal history of malignant neoplasm of breast: Secondary | ICD-10-CM

## 2020-01-17 DIAGNOSIS — Z880 Allergy status to penicillin: Secondary | ICD-10-CM

## 2020-01-17 DIAGNOSIS — N1832 Chronic kidney disease, stage 3b: Secondary | ICD-10-CM | POA: Diagnosis present

## 2020-01-17 DIAGNOSIS — Z7982 Long term (current) use of aspirin: Secondary | ICD-10-CM

## 2020-01-17 DIAGNOSIS — G20 Parkinson's disease: Secondary | ICD-10-CM | POA: Diagnosis present

## 2020-01-17 DIAGNOSIS — E785 Hyperlipidemia, unspecified: Secondary | ICD-10-CM | POA: Diagnosis present

## 2020-01-17 DIAGNOSIS — I447 Left bundle-branch block, unspecified: Secondary | ICD-10-CM | POA: Diagnosis present

## 2020-01-17 DIAGNOSIS — R0902 Hypoxemia: Secondary | ICD-10-CM | POA: Diagnosis not present

## 2020-01-17 DIAGNOSIS — E039 Hypothyroidism, unspecified: Secondary | ICD-10-CM | POA: Diagnosis present

## 2020-01-17 DIAGNOSIS — Z8673 Personal history of transient ischemic attack (TIA), and cerebral infarction without residual deficits: Secondary | ICD-10-CM | POA: Diagnosis not present

## 2020-01-17 DIAGNOSIS — Z9189 Other specified personal risk factors, not elsewhere classified: Secondary | ICD-10-CM | POA: Diagnosis not present

## 2020-01-17 DIAGNOSIS — J690 Pneumonitis due to inhalation of food and vomit: Principal | ICD-10-CM | POA: Diagnosis present

## 2020-01-17 DIAGNOSIS — N390 Urinary tract infection, site not specified: Secondary | ICD-10-CM | POA: Diagnosis present

## 2020-01-17 DIAGNOSIS — I130 Hypertensive heart and chronic kidney disease with heart failure and stage 1 through stage 4 chronic kidney disease, or unspecified chronic kidney disease: Secondary | ICD-10-CM | POA: Diagnosis present

## 2020-01-17 DIAGNOSIS — J9811 Atelectasis: Secondary | ICD-10-CM | POA: Diagnosis present

## 2020-01-17 DIAGNOSIS — L89611 Pressure ulcer of right heel, stage 1: Secondary | ICD-10-CM | POA: Diagnosis present

## 2020-01-17 DIAGNOSIS — G20C Parkinsonism, unspecified: Secondary | ICD-10-CM

## 2020-01-17 DIAGNOSIS — I248 Other forms of acute ischemic heart disease: Secondary | ICD-10-CM | POA: Diagnosis present

## 2020-01-17 DIAGNOSIS — R0600 Dyspnea, unspecified: Secondary | ICD-10-CM

## 2020-01-17 DIAGNOSIS — I509 Heart failure, unspecified: Secondary | ICD-10-CM

## 2020-01-17 DIAGNOSIS — Z7189 Other specified counseling: Secondary | ICD-10-CM | POA: Diagnosis not present

## 2020-01-17 DIAGNOSIS — E875 Hyperkalemia: Secondary | ICD-10-CM | POA: Diagnosis not present

## 2020-01-17 DIAGNOSIS — Z515 Encounter for palliative care: Secondary | ICD-10-CM

## 2020-01-17 DIAGNOSIS — Z79890 Hormone replacement therapy (postmenopausal): Secondary | ICD-10-CM

## 2020-01-17 DIAGNOSIS — B9620 Unspecified Escherichia coli [E. coli] as the cause of diseases classified elsewhere: Secondary | ICD-10-CM | POA: Diagnosis present

## 2020-01-17 DIAGNOSIS — R1310 Dysphagia, unspecified: Secondary | ICD-10-CM | POA: Diagnosis present

## 2020-01-17 DIAGNOSIS — I5043 Acute on chronic combined systolic (congestive) and diastolic (congestive) heart failure: Secondary | ICD-10-CM | POA: Diagnosis present

## 2020-01-17 DIAGNOSIS — Z993 Dependence on wheelchair: Secondary | ICD-10-CM

## 2020-01-17 DIAGNOSIS — Z66 Do not resuscitate: Secondary | ICD-10-CM | POA: Diagnosis present

## 2020-01-17 DIAGNOSIS — E876 Hypokalemia: Secondary | ICD-10-CM | POA: Diagnosis not present

## 2020-01-17 DIAGNOSIS — G231 Progressive supranuclear ophthalmoplegia [Steele-Richardson-Olszewski]: Secondary | ICD-10-CM | POA: Diagnosis present

## 2020-01-17 DIAGNOSIS — I493 Ventricular premature depolarization: Secondary | ICD-10-CM | POA: Diagnosis not present

## 2020-01-17 DIAGNOSIS — L8990 Pressure ulcer of unspecified site, unspecified stage: Secondary | ICD-10-CM | POA: Insufficient documentation

## 2020-01-17 DIAGNOSIS — R7989 Other specified abnormal findings of blood chemistry: Secondary | ICD-10-CM

## 2020-01-17 LAB — COMPREHENSIVE METABOLIC PANEL
ALT: 26 U/L (ref 0–44)
AST: 28 U/L (ref 15–41)
Albumin: 3.9 g/dL (ref 3.5–5.0)
Alkaline Phosphatase: 56 U/L (ref 38–126)
Anion gap: 8 (ref 5–15)
BUN: 29 mg/dL — ABNORMAL HIGH (ref 8–23)
CO2: 27 mmol/L (ref 22–32)
Calcium: 8.8 mg/dL — ABNORMAL LOW (ref 8.9–10.3)
Chloride: 101 mmol/L (ref 98–111)
Creatinine, Ser: 0.8 mg/dL (ref 0.44–1.00)
GFR calc Af Amer: >60 mL/min (ref >60)
GFR calc non Af Amer: >60 mL/min (ref >60)
Glucose, Bld: 82 mg/dL (ref 70–99)
Potassium: 4.2 mmol/L (ref 3.5–5.1)
Sodium: 136 mmol/L (ref 135–145)
Total Bilirubin: 0.7 mg/dL (ref 0.3–1.2)
Total Protein: 7.6 g/dL (ref 6.5–8.1)

## 2020-01-17 LAB — CBC WITH DIFFERENTIAL/PLATELET
Abs Immature Granulocytes: 0.01 10*3/uL (ref 0.00–0.07)
Basophils Absolute: 0 10*3/uL (ref 0.0–0.1)
Basophils Relative: 0 %
Eosinophils Absolute: 0.1 10*3/uL (ref 0.0–0.5)
Eosinophils Relative: 3 %
HCT: 38.3 % (ref 36.0–46.0)
Hemoglobin: 12.1 g/dL (ref 12.0–15.0)
Immature Granulocytes: 0 %
Lymphocytes Relative: 14 %
Lymphs Abs: 0.8 10*3/uL (ref 0.7–4.0)
MCH: 31.4 pg (ref 26.0–34.0)
MCHC: 31.6 g/dL (ref 30.0–36.0)
MCV: 99.5 fL (ref 80.0–100.0)
Monocytes Absolute: 0.6 10*3/uL (ref 0.1–1.0)
Monocytes Relative: 12 %
Neutro Abs: 3.8 10*3/uL (ref 1.7–7.7)
Neutrophils Relative %: 71 %
Platelets: 215 10*3/uL (ref 150–400)
RBC: 3.85 MIL/uL — ABNORMAL LOW (ref 3.87–5.11)
RDW: 13.8 % (ref 11.5–15.5)
WBC: 5.3 10*3/uL (ref 4.0–10.5)
nRBC: 0 % (ref 0.0–0.2)

## 2020-01-17 LAB — TROPONIN I (HIGH SENSITIVITY)
Troponin I (High Sensitivity): 18 ng/L — ABNORMAL HIGH (ref <18)
Troponin I (High Sensitivity): 20 ng/L — ABNORMAL HIGH (ref <18)

## 2020-01-17 LAB — URINALYSIS, ROUTINE W REFLEX MICROSCOPIC
Bilirubin Urine: NEGATIVE
Glucose, UA: NEGATIVE mg/dL
Ketones, ur: NEGATIVE mg/dL
Nitrite: POSITIVE — AB
Protein, ur: NEGATIVE mg/dL
Specific Gravity, Urine: 1.01 (ref 1.005–1.030)
pH: 5.5 (ref 5.0–8.0)

## 2020-01-17 LAB — URINALYSIS, MICROSCOPIC (REFLEX): WBC, UA: >50 WBC/hpf (ref 0–5)

## 2020-01-17 LAB — SARS CORONAVIRUS 2 BY RT PCR (HOSPITAL ORDER, PERFORMED IN ~~LOC~~ HOSPITAL LAB): SARS Coronavirus 2: NEGATIVE

## 2020-01-17 LAB — BRAIN NATRIURETIC PEPTIDE: B Natriuretic Peptide: 504.5 pg/mL — ABNORMAL HIGH (ref 0.0–100.0)

## 2020-01-17 MED ORDER — FUROSEMIDE 10 MG/ML IJ SOLN
20.0000 mg | Freq: Two times a day (BID) | INTRAMUSCULAR | Status: DC
  Filled 2020-01-17: qty 2

## 2020-01-17 MED ORDER — HYDRALAZINE HCL 20 MG/ML IJ SOLN
5.0000 mg | INTRAMUSCULAR | Status: DC | PRN
  Administered 2020-01-17: 5 mg via INTRAVENOUS
  Filled 2020-01-17: qty 1

## 2020-01-17 MED ORDER — ACETAMINOPHEN 325 MG PO TABS
650.0000 mg | ORAL_TABLET | ORAL | Status: DC | PRN
  Administered 2020-01-24 – 2020-01-30 (×5): 650 mg via ORAL
  Filled 2020-01-17 (×5): qty 2

## 2020-01-17 MED ORDER — SODIUM CHLORIDE 0.9% FLUSH
3.0000 mL | Freq: Two times a day (BID) | INTRAVENOUS | Status: DC
  Administered 2020-01-17 – 2020-01-30 (×22): 3 mL via INTRAVENOUS

## 2020-01-17 MED ORDER — GUAIFENESIN ER 600 MG PO TB12
600.0000 mg | ORAL_TABLET | Freq: Two times a day (BID) | ORAL | Status: DC
  Administered 2020-01-17 – 2020-01-31 (×22): 600 mg via ORAL
  Filled 2020-01-17 (×24): qty 1

## 2020-01-17 MED ORDER — FUROSEMIDE 10 MG/ML IJ SOLN
20.0000 mg | Freq: Once | INTRAMUSCULAR | Status: AC
  Administered 2020-01-17: 20 mg via INTRAVENOUS
  Filled 2020-01-17: qty 4

## 2020-01-17 MED ORDER — SODIUM CHLORIDE 0.9 % IV SOLN: 250.0000 mL | INTRAVENOUS | Status: DC | PRN

## 2020-01-17 MED ORDER — ENOXAPARIN SODIUM 40 MG/0.4ML ~~LOC~~ SOLN
40.0000 mg | SUBCUTANEOUS | Status: DC
  Administered 2020-01-17 – 2020-01-28 (×12): 40 mg via SUBCUTANEOUS
  Filled 2020-01-17 (×12): qty 0.4

## 2020-01-17 MED ORDER — SODIUM CHLORIDE (PF) 0.9 % IJ SOLN
INTRAMUSCULAR | Status: AC
  Filled 2020-01-17: qty 50

## 2020-01-17 MED ORDER — IOHEXOL 350 MG/ML SOLN
100.0000 mL | Freq: Once | INTRAVENOUS | Status: AC | PRN
  Administered 2020-01-17: 100 mL via INTRAVENOUS

## 2020-01-17 MED ORDER — SODIUM CHLORIDE 0.9% FLUSH
3.0000 mL | INTRAVENOUS | Status: DC | PRN
  Administered 2020-01-20: 3 mL via INTRAVENOUS

## 2020-01-17 NOTE — ED Triage Notes (Signed)
Patient reports to the ER from [facility]. Patient is coming in for SOB. Patient has audible wheezing. Patient's breathing is labored.

## 2020-01-17 NOTE — H&P (Signed)
History and Physical    Betty Chan SWF:[id number] DOB: 02/11/1930 DOA: 01/17/2020  PCP: Hoyt Koch, MD Patient coming from: [facility]  Chief Complaint: Shortness of breath  HPI: Betty Chan is a 86 y.o. female with medical history significant of severe aortic stenosis status post TAVR in 2015, breast cancer status post lumpectomy, hypertension, hyperlipidemia, hypothyroidism, GERD, and other comorbidities presenting with complaints of shortness of breath.  Patient reports 5-day history of dyspnea which especially worse with exertion.  She is also coughing a little and her ankles have been swollen.  Denies fevers.  Denies history of asthma, COPD, or CHF.  She has no other complaints.  ED Course: Afebrile.  Not tachycardic.  Oxygen saturation 90% on room air, improved to 100% with 4 L supplemental oxygen.  Labs showing no leukocytosis.  BNP elevated at 504.  Initial high-sensitivity troponin 18, repeat pending.  SARS-CoV-2 PCR test negative.  Chest x-ray with findings favored to reflect pulmonary edema versus atypical infection.  CT angiogram chest negative for PE.  Showing evidence of patchy atelectasis, nonspecific peribronchial thickening, and cardiomegaly.  Patient received IV Lasix 20 mg.  Review of Systems:  All systems reviewed and apart from history of presenting illness, are negative.  Past Medical History:  Diagnosis Date  . Aortic stenosis    s/p TAVR 08/2013 -follows at [facility] cards [facility])  . Basal cell carcinoma   . Cancer of right breast [facility]) 2004   lumpectomy  . Dyslipidemia   . GERD (gastroesophageal reflux disease)   . Heart murmur   . Hypertension   . Hypothyroid   . OA (osteoarthritis) of knee    "right" (11/24/2014)  . Osteopenia 02/13/2014   DEXA 01/2014: -2.2  . PHN (postherpetic neuralgia)    L anterior thigh 2007, recurrent 04/2013  . Seizure ([place]) 2007   single event, off Keppra since 11/2013; "not sure if it was a TIA or seizure"   . Squamous cell cancer of skin of forearm   . TIA (transient ischemic attack) 2007   "not sure if it was a TIA or seizure"    Past Surgical History:  Procedure Laterality Date  . BASAL CELL CARCINOMA EXCISION     sternum  . BREAST BIOPSY Right 2004  . BREAST LUMPECTOMY Right 08/2002 X 2  . CARDIAC CATHETERIZATION  "several"  . CARDIAC VALVE REPLACEMENT    . CATARACT EXTRACTION W/ INTRAOCULAR LENS  IMPLANT, BILATERAL Bilateral 03/2014-07/2014  . SQUAMOUS CELL CARCINOMA EXCISION Left 06/2014   'forearm"  . TONSILLECTOMY    . TRANSCATHETER AORTIC VALVE REPLACEMENT, TRANSFEMORAL  09/06/13   [facility]     reports that she has never smoked. She has never used smokeless tobacco. She reports that she does not drink alcohol and does not use drugs.  Allergies  Allergen Reactions  . Amoxicillin Rash  . Penicillins Rash    Family History  Problem Relation Age of Onset  . Prostate cancer Father   . Breast cancer Sister   . Stroke Mother 100  . Dementia Sister     Prior to Admission medications   Medication Sig Start Date End Date Taking? Authorizing Provider  acetaminophen (TYLENOL) 500 MG tablet Take 1 tablet (500 mg total) by mouth every 6 (six) hours as needed for fever. Patient taking differently: Take 500 mg by mouth every 8 (eight) hours.  06/26/14   Rowe Clack, MD  aspirin 81 MG tablet Take 81 mg by mouth daily.    [provider]  atorvastatin (LIPITOR) 20 MG tablet TAKE 1 TABLET BY MOUTH EVERY DAY AT 6 PM 07/25/14   Rowe Clack, MD  Biotin 5000 MCG CAPS Take 5,000 mcg by mouth daily.    [provider]  carbidopa-levodopa (SINEMET IR) 25-100 MG tablet Take 2 tablets by mouth 3 (three) times daily. 10/21/17   Tat, Eustace Quail, DO  cholecalciferol (VITAMIN D) 1000 UNITS tablet Take 1,000 Units by mouth 2 (two) times daily.     [provider]  clindamycin (CLEOCIN) 150 MG capsule Take 600 mg by mouth as needed (1 hour prior to dental appointment).   11/07/14   [provider]  diclofenac sodium (VOLTAREN) 1 % GEL APPLY 2 GRAMS TOPICALLY 3 (THREE) TIMES DAILY. 04/13/17   Lyndal Pulley, DO  doxycycline (VIBRA-TABS) 100 MG tablet Take 1 tablet (100 mg total) by mouth 2 (two) times daily. 03/30/18   Trula Slade, DPM  glucosamine-chondroitin 500-400 MG tablet Take 1 tablet by mouth 2 (two) times daily.    [provider]  levothyroxine (SYNTHROID, LEVOTHROID) 50 MCG tablet Take 1 tablet (50 mcg total) by mouth daily before breakfast. 03/28/14   Rowe Clack, MD  lisinopril (PRINIVIL,ZESTRIL) 5 MG tablet Take 1 tablet (5 mg total) by mouth daily. Annual visit due in Jan for further refills 03/10/17   Hoyt Koch, MD  metoprolol tartrate (LOPRESSOR) 25 MG tablet Take 0.5 tablets (12.5 mg total) by mouth 2 (two) times daily. 02/24/17   Hoyt Koch, MD  mirabegron ER (MYRBETRIQ) 50 MG TB24 tablet Take 50 mg by mouth daily.    [provider]  Multiple Vitamins-Minerals (CENTRUM FLAVOR BURST ADULT) CHEW Chew 2 tablets by mouth daily.    [provider]  omeprazole (PRILOSEC) 20 MG capsule Take 1 capsule (20 mg total) by mouth daily. 03/28/14   Rowe Clack, MD  oxybutynin (DITROPAN) 5 MG tablet Take 2 tablets (10 mg total) by mouth 2 (two) times daily. 01/17/16   Hoyt Koch, MD  sertraline (ZOLOFT) 50 MG tablet Take 50 mg by mouth daily.    [provider]  TURMERIC PO Take 1 tablet by mouth daily.    [provider]    Physical Exam: Vitals:   01/17/20 1519 01/17/20 1830  BP: (!) 152/81 (!) 166/72  Pulse: 84 96  Resp: 15 (!) 11  Temp: 97.8 F (36.6 C)   TempSrc: Oral   SpO2: 100% 99%    Physical Exam Constitutional:      General: She is not in acute distress. HENT:     Head: Normocephalic and atraumatic.  Eyes:     Extraocular Movements: Extraocular movements intact.     Conjunctiva/sclera: Conjunctivae normal.  Neck:     Comments:  +JVD Cardiovascular:     Rate and Rhythm: Normal rate and regular rhythm.     Pulses: Normal pulses.  Pulmonary:     Effort: Pulmonary effort is normal.     Breath sounds: Rales present. No wheezing.     Comments: Bibasilar rales Abdominal:     General: Bowel sounds are normal. There is no distension.     Palpations: Abdomen is soft.     Tenderness: There is no abdominal tenderness. There is no guarding.  Musculoskeletal:     Cervical back: Normal range of motion and neck supple.     Right lower leg: Edema present.     Left lower leg: Edema present.     Comments: +3 pitting  edema bilateral lower extremities  Skin:    General: Skin is warm and dry.  Neurological:     General: No focal deficit present.     Mental Status: She is alert and oriented to person, place, and time.     Labs on Admission: I have personally reviewed following labs and imaging studies  CBC: Recent Labs  Lab 01/17/20 1500  WBC 5.3  NEUTROABS 3.8  HGB 12.1  HCT 38.3  MCV 99.5  PLT 096   Basic Metabolic Panel: Recent Labs  Lab 01/17/20 1500  NA 136  K 4.2  CL 101  CO2 27  GLUCOSE 82  BUN 29*  CREATININE 0.80  CALCIUM 8.8*   GFR: CrCl cannot be calculated (Unknown ideal weight.). Liver Function Tests: Recent Labs  Lab 01/17/20 1500  AST 28  ALT 26  ALKPHOS 56  BILITOT 0.7  PROT 7.6  ALBUMIN 3.9   No results for input(s): LIPASE, AMYLASE in the last 168 hours. No results for input(s): AMMONIA in the last 168 hours. Coagulation Profile: No results for input(s): INR, PROTIME in the last 168 hours. Cardiac Enzymes: No results for input(s): CKTOTAL, CKMB, CKMBINDEX, TROPONINI in the last 168 hours. BNP (last 3 results) No results for input(s): PROBNP in the last 8760 hours. HbA1C: No results for input(s): HGBA1C in the last 72 hours. CBG: No results for input(s): GLUCAP in the last 168 hours. Lipid Profile: No results for input(s): CHOL, HDL, LDLCALC, TRIG, CHOLHDL, LDLDIRECT in  the last 72 hours. Thyroid Function Tests: No results for input(s): TSH, T4TOTAL, FREET4, T3FREE, THYROIDAB in the last 72 hours. Anemia Panel: No results for input(s): VITAMINB12, FOLATE, FERRITIN, TIBC, IRON, RETICCTPCT in the last 72 hours. Urine analysis:    Component Value Date/Time   COLORURINE YELLOW 11/25/2014 0220   APPEARANCEUR CLOUDY (A) 11/25/2014 0220   LABSPEC 1.019 11/25/2014 0220   PHURINE 6.0 11/25/2014 0220   GLUCOSEU NEGATIVE 11/25/2014 0220   HGBUR TRACE (A) 11/25/2014 0220   BILIRUBINUR NEGATIVE 11/25/2014 0220   KETONESUR NEGATIVE 11/25/2014 0220   PROTEINUR NEGATIVE 11/25/2014 0220   UROBILINOGEN 1.0 11/25/2014 0220   NITRITE NEGATIVE 11/25/2014 0220   LEUKOCYTESUR MODERATE (A) 11/25/2014 0220    Radiological Exams on Admission: DG Chest 2 View  Result Date: 01/17/2020 CLINICAL DATA:  Shortness of breath EXAM: CHEST - 2 VIEW COMPARISON:  None. FINDINGS: The cardiomediastinal silhouette is mildly enlarged in contour.Status post TAVR. Atherosclerotic calcifications the aorta. Tortuous thoracic aorta. Hiatal hernia. Elevation the RIGHT hemidiaphragm. Diffuse reticular prominence. Mild peribronchial cuffing. No pleural effusion. No pneumothorax. LEFT basilar linear opacities, likely atelectasis. Surgical clips of the RIGHT axilla. Visualized abdomen is unremarkable. Multilevel degenerative changes of the thoracic spine. IMPRESSION: 1. Constellation of findings are favored to reflect pulmonary edema scattered areas of atelectasis. Atypical infection could present similarly. Electronically Signed   By: Valentino Saxon MD   On: 01/17/2020 15:26   CT Angio Chest PE W/Cm &/Or Wo Cm  Result Date: 01/17/2020 CLINICAL DATA:  Chest pain, shortness of breath EXAM: CT ANGIOGRAPHY CHEST WITH CONTRAST TECHNIQUE: Multidetector CT imaging of the chest was performed using the standard protocol during bolus administration of intravenous contrast. Multiplanar CT image reconstructions  and MIPs were obtained to evaluate the vascular anatomy. CONTRAST:  13mL OMNIPAQUE IOHEXOL 350 MG/ML SOLN COMPARISON:  None. FINDINGS: Cardiovascular: Satisfactory opacification of the pulmonary arteries to the lobar level and some proximal segmental arteries. Evaluation is more limited at the lung bases. No evidence of pulmonary  embolism. Cardiomegaly. Aortic valve replacement. No pericardial effusion. Thoracic aorta calcification. Coronary artery calcification. Mediastinum/Nodes: Nonspecific mildly enlarged right suprahilar lymph node. Esophagus is unremarkable. Lungs/Pleura: Imaging is in expiration. Patchy atelectasis is present. Probable scarring at the lung bases. Nonspecific peribronchial thickening. No pleural effusion or pneumothorax. Upper Abdomen: No acute abnormality. Musculoskeletal: No acute osseous abnormality. No significant chest wall finding. Review of the MIP images confirms the above findings. IMPRESSION: No evidence of acute pulmonary embolism within the above limitation. Imaging in expiration with patchy atelectasis. Nonspecific peribronchial thickening. Cardiomegaly. Aortic Atherosclerosis (ICD10-I70.0). Electronically Signed   By: Macy Mis M.D.   On: 01/17/2020 18:15    EKG: Independently reviewed.  Sinus rhythm, PVCs.  LBBB new compared to prior tracing from 2016.  Assessment/Plan Principal Problem:   Acute exacerbation of CHF (congestive heart failure) (HCC) Active Problems:   Hyperlipidemia   Essential hypertension   Elevated troponin   Acute respiratory failure with hypoxia (HCC)   Acute hypoxic respiratory failure, suspect secondary to acute CHF exacerbation: Patient is presenting with complaints of dyspnea, cough, and bilateral lower extremity edema.  Oxygen saturation 90% on room air, improved to 100% with 4 L supplemental oxygen.  No increased work of breathing.  Although CT angiogram is not showing overt pulmonary edema, patient appears volume overloaded on exam  with JVD, rales, and +3 pitting edema bilateral lower extremities.  BNP elevated at 504.  Suspect patient's presentation is due to CHF exacerbation.  She has a history of severe aortic stenosis status post TAVR in 2015.  Per records from [place], echo in 2016 showing EF 45% and grade 1 diastolic dysfunction.  She is not on diuretic therapy. -IV Lasix 20 mg ordered in the ED.  Continue IV Lasix 20 mg twice daily starting in the morning.  Mucinex for cough.  Order echocardiogram.  Intake and output, daily weights, and low-sodium diet with fluid restriction.  Continue to monitor renal function.  Mild troponin elevation: EKG showing LBBB which appears new compared to prior tracing from 2016.  However, ACS less likely as patient denies chest pain and high-sensitivity troponin 18.  Suspect borderline troponin elevation is due to acutely decompensated CHF. -Cardiac monitoring, echocardiogram, trend troponin  Hypertension: Blood pressure slightly elevated with systolic in the 163W to 466Z. -Lasix as above.  Resume home metoprolol after pharmacy med rec is done.  Order hydralazine as needed for SBP greater than 170.  Hyperlipidemia -Resume home Lipitor after pharmacy med rec is done  Hypothyroidism -Resume Synthroid after pharmacy med rec is done  GERD -Resume PPI after pharmacy med rec is done  DVT prophylaxis: Lovenox Code Status: Full code-discussed with the patient Family Communication: No family available at this time. Disposition Plan: Status is: Inpatient  Remains inpatient appropriate because:IV treatments appropriate due to intensity of illness or inability to take PO and Inpatient level of care appropriate due to severity of illness   Dispo: The patient is from: ALF              Anticipated d/c is to: SNF              Anticipated d/c date is: 3 days              Patient currently is not medically stable to d/c.  The medical decision making on this patient was of high complexity and the  patient is at high risk for clinical deterioration, therefore this is a level 3 visit.  Shela Leff MD [facility]  If 7PM-7AM, please contact night-coverage [email or web address]  01/17/2020, 8:37 PM

## 2020-01-17 NOTE — ED Notes (Signed)
Attempted to call report to 4W. Receiving RN states she is unable to take report at this time and will call back.

## 2020-01-17 NOTE — ED Provider Notes (Signed)
[facility] DEPT Provider Note   CSN: [id number] Arrival date & time: 01/17/20  1442     History Chief Complaint  Patient presents with  . Shortness of Breath    Betty Chan is a 86 y.o. female with pertinent past medical history of aortic stenosis, dyslipidemia, hypertension that presents the emergency department via EMS from [place] states for shortness of breath.  Patient states that she has been feeling short of breath for the past couple of days, states that she did not want to go to the ER then but now her shortness of breath has increased and she is now here.  States that she also has a mild cough with some production.  Clear sputum.  Denies any chest pain, weakness, paresthesias, abdominal pain, nausea, vomiting, sore throat, headache, vision changes.  Denies any weight changes, fevers, chills.  States that she was normal health before this.  Has never had anything like this before.  Denies any lung history.  No history of CHF, COPD, asthma, allergies.  No recent hospitalizations.  Has gotten her Covid vaccines.  No sick contacts.  Is not on oxygen at home.  EMS states that she was satting at 90% on room air, did increase oxygen to 4 L and is now satting 100%.  Has not taken anything for this.  HPI     Past Medical History:  Diagnosis Date  . Aortic stenosis    s/p TAVR 08/2013 -follows at [facility] cards [facility])  . Basal cell carcinoma   . Cancer of right breast [facility]) 2004   lumpectomy  . Dyslipidemia   . GERD (gastroesophageal reflux disease)   . Heart murmur   . Hypertension   . Hypothyroid   . OA (osteoarthritis) of knee    "right" (11/24/2014)  . Osteopenia 02/13/2014   DEXA 01/2014: -2.2  . PHN (postherpetic neuralgia)    L anterior thigh 2007, recurrent 04/2013  . Seizure ([place]) 2007   single event, off Keppra since 11/2013; "not sure if it was a TIA or seizure"  . Squamous cell cancer of skin of forearm   . TIA (transient ischemic  attack) 2007   "not sure if it was a TIA or seizure"    Patient Active Problem List   Diagnosis Date Noted  . Acute on chronic respiratory failure with hypoxia ([place]) 01/17/2020  . Gait instability 01/17/2016  . Routine general medical examination at a health care facility 07/20/2015  . Chronic venous insufficiency 01/03/2015  . Osteoarthritis of right lower extremity 11/13/2014  . Osteopenia 02/13/2014  . Essential hypertension   . History of breast cancer 11/09/2013  . Seizures ([place]) 10/04/2013  . H/O aortic valve replacement TAVR 09/07/2013  . Hypothyroid   . Hyperlipidemia   . PHN (postherpetic neuralgia)   . GERD (gastroesophageal reflux disease)   . OAB (overactive bladder)     Past Surgical History:  Procedure Laterality Date  . BASAL CELL CARCINOMA EXCISION     sternum  . BREAST BIOPSY Right 2004  . BREAST LUMPECTOMY Right 08/2002 X 2  . CARDIAC CATHETERIZATION  "several"  . CARDIAC VALVE REPLACEMENT    . CATARACT EXTRACTION W/ INTRAOCULAR LENS  IMPLANT, BILATERAL Bilateral 03/2014-07/2014  . SQUAMOUS CELL CARCINOMA EXCISION Left 06/2014   'forearm"  . TONSILLECTOMY    . TRANSCATHETER AORTIC VALVE REPLACEMENT, TRANSFEMORAL  09/06/13   [facility]     OB History   No obstetric history on file.     Family History  Problem Relation Age of Onset  . Prostate cancer Father   . Breast cancer Sister   . Stroke Mother 100  . Dementia Sister     Social History   Tobacco Use  . Smoking status: Never Smoker  . Smokeless tobacco: Never Used  . Tobacco comment: independent living with assistance  Substance Use Topics  . Alcohol use: No  . Drug use: No    Home Medications Prior to Admission medications   Medication Sig Start Date End Date Taking? Authorizing Provider  acetaminophen (TYLENOL) 500 MG tablet Take 1 tablet (500 mg total) by mouth every 6 (six) hours as needed for fever. Patient taking differently: Take 500 mg by mouth every 8 (eight) hours.  06/26/14    Rowe Clack, MD  aspirin 81 MG tablet Take 81 mg by mouth daily.    [provider]  atorvastatin (LIPITOR) 20 MG tablet TAKE 1 TABLET BY MOUTH EVERY DAY AT 6 PM 07/25/14   Rowe Clack, MD  Biotin 5000 MCG CAPS Take 5,000 mcg by mouth daily.    [provider]  carbidopa-levodopa (SINEMET IR) 25-100 MG tablet Take 2 tablets by mouth 3 (three) times daily. 10/21/17   Tat, Eustace Quail, DO  cholecalciferol (VITAMIN D) 1000 UNITS tablet Take 1,000 Units by mouth 2 (two) times daily.     [provider]  clindamycin (CLEOCIN) 150 MG capsule Take 600 mg by mouth as needed (1 hour prior to dental appointment).  11/07/14   [provider]  diclofenac sodium (VOLTAREN) 1 % GEL APPLY 2 GRAMS TOPICALLY 3 (THREE) TIMES DAILY. 04/13/17   Lyndal Pulley, DO  doxycycline (VIBRA-TABS) 100 MG tablet Take 1 tablet (100 mg total) by mouth 2 (two) times daily. 03/30/18   Trula Slade, DPM  glucosamine-chondroitin 500-400 MG tablet Take 1 tablet by mouth 2 (two) times daily.    [provider]  levothyroxine (SYNTHROID, LEVOTHROID) 50 MCG tablet Take 1 tablet (50 mcg total) by mouth daily before breakfast. 03/28/14   Rowe Clack, MD  lisinopril (PRINIVIL,ZESTRIL) 5 MG tablet Take 1 tablet (5 mg total) by mouth daily. Annual visit due in Jan for further refills 03/10/17   Hoyt Koch, MD  metoprolol tartrate (LOPRESSOR) 25 MG tablet Take 0.5 tablets (12.5 mg total) by mouth 2 (two) times daily. 02/24/17   Hoyt Koch, MD  mirabegron ER (MYRBETRIQ) 50 MG TB24 tablet Take 50 mg by mouth daily.    [provider]  Multiple Vitamins-Minerals (CENTRUM FLAVOR BURST ADULT) CHEW Chew 2 tablets by mouth daily.    [provider]  omeprazole (PRILOSEC) 20 MG capsule Take 1 capsule (20 mg total) by mouth daily. 03/28/14   Rowe Clack, MD  oxybutynin (DITROPAN) 5 MG tablet Take 2 tablets (10 mg total) by mouth 2 (two) times  daily. 01/17/16   Hoyt Koch, MD  sertraline (ZOLOFT) 50 MG tablet Take 50 mg by mouth daily.    [provider]  TURMERIC PO Take 1 tablet by mouth daily.    [provider]    Allergies    Amoxicillin and Penicillins  Review of Systems   Review of Systems  Constitutional: Negative for chills, diaphoresis, fatigue and fever.  HENT: Negative for congestion, sore throat and trouble swallowing.   Eyes: Negative for pain and visual disturbance.  Respiratory: Positive for cough and shortness of breath. Negative for wheezing.   Cardiovascular: Negative for chest pain, palpitations and leg  swelling.  Gastrointestinal: Negative for abdominal distention, abdominal pain, diarrhea, nausea and vomiting.  Genitourinary: Negative for difficulty urinating.  Musculoskeletal: Negative for back pain, neck pain and neck stiffness.  Skin: Negative for pallor.  Neurological: Negative for dizziness, speech difficulty, weakness and headaches.  Psychiatric/Behavioral: Negative for confusion.    Physical Exam Updated Vital Signs BP (!) 166/72   Pulse 96   Temp 97.8 F (36.6 C) (Oral)   Resp (!) 11   SpO2 99%   Physical Exam Constitutional:      General: She is not in acute distress.    Appearance: Normal appearance. She is not ill-appearing, toxic-appearing or diaphoretic.     Comments: Patient's breathing is labored, is not in any respiratory distress.  Is not using accessory muscle use.  Is able to speak to me in full sentences.  Handling secretions well.  HENT:     Mouth/Throat:     Mouth: Mucous membranes are moist.     Pharynx: Oropharynx is clear.  Eyes:     General: No scleral icterus.    Extraocular Movements: Extraocular movements intact.     Pupils: Pupils are equal, round, and reactive to light.  Cardiovascular:     Rate and Rhythm: Normal rate and regular rhythm.     Pulses: Normal pulses.     Heart sounds: Normal heart sounds.  Pulmonary:     Effort:  Pulmonary effort is normal. Tachypnea present. No accessory muscle usage or respiratory distress.     Breath sounds: No stridor. Examination of the right-upper field reveals rales. Examination of the left-upper field reveals rales. Examination of the right-middle field reveals rales. Examination of the left-middle field reveals rales. Examination of the right-lower field reveals rales. Examination of the left-lower field reveals rales. Wheezing (Heard sporadically throughout) and rales present. No rhonchi.  Chest:     Chest wall: No tenderness.  Abdominal:     General: Abdomen is flat. There is no distension.     Palpations: Abdomen is soft.     Tenderness: There is no abdominal tenderness. There is no guarding or rebound.  Musculoskeletal:        General: No swelling or tenderness. Normal range of motion.     Cervical back: Normal range of motion and neck supple. No rigidity.     Right lower leg: Edema (Same as left) present.     Left lower leg: Edema (1+ no overlying signs of infection, goes up until mid shin. ) present.  Skin:    General: Skin is warm and dry.     Capillary Refill: Capillary refill takes less than 2 seconds.     Coloration: Skin is not pale.  Neurological:     General: No focal deficit present.     Mental Status: She is alert and oriented to person, place, and time.     Cranial Nerves: No cranial nerve deficit.     Sensory: No sensory deficit.     Motor: No weakness.     Coordination: Coordination normal.  Psychiatric:        Mood and Affect: Mood normal.        Behavior: Behavior normal.     ED Results / Procedures / Treatments   Labs (all labs ordered are listed, but only abnormal results are displayed) Labs Reviewed  COMPREHENSIVE METABOLIC PANEL - Abnormal; Notable for the following components:      Result Value   BUN 29 (*)    Calcium 8.8 (*)  All other components within normal limits  CBC WITH DIFFERENTIAL/PLATELET - Abnormal; Notable for the  following components:   RBC 3.85 (*)    All other components within normal limits  BRAIN NATRIURETIC PEPTIDE - Abnormal; Notable for the following components:   B Natriuretic Peptide 504.5 (*)    All other components within normal limits  TROPONIN I (HIGH SENSITIVITY) - Abnormal; Notable for the following components:   Troponin I (High Sensitivity) 18 (*)    All other components within normal limits  TROPONIN I (HIGH SENSITIVITY) - Abnormal; Notable for the following components:   Troponin I (High Sensitivity) 20 (*)    All other components within normal limits  SARS CORONAVIRUS 2 BY RT PCR (HOSPITAL ORDER, [facility])  URINALYSIS, ROUTINE W REFLEX MICROSCOPIC    EKG EKG Interpretation  Date/Time:  Tuesday January 17 2020 15:20:54 EDT Ventricular Rate:  81 PR Interval:    QRS Duration: 128 QT Interval:  423 QTC Calculation: 491 R Axis:   -161 Text Interpretation: Sinus rhythm Multiple ventricular premature complexes Left bundle branch block Confirmed by Madalyn Rob [phone]) on 01/17/2020 4:32:58 PM   Radiology DG Chest 2 View  Result Date: 01/17/2020 CLINICAL DATA:  Shortness of breath EXAM: CHEST - 2 VIEW COMPARISON:  None. FINDINGS: The cardiomediastinal silhouette is mildly enlarged in contour.Status post TAVR. Atherosclerotic calcifications the aorta. Tortuous thoracic aorta. Hiatal hernia. Elevation the RIGHT hemidiaphragm. Diffuse reticular prominence. Mild peribronchial cuffing. No pleural effusion. No pneumothorax. LEFT basilar linear opacities, likely atelectasis. Surgical clips of the RIGHT axilla. Visualized abdomen is unremarkable. Multilevel degenerative changes of the thoracic spine. IMPRESSION: 1. Constellation of findings are favored to reflect pulmonary edema scattered areas of atelectasis. Atypical infection could present similarly. Electronically Signed   By: Valentino Saxon MD   On: 01/17/2020 15:26   CT Angio Chest PE W/Cm &/Or Wo  Cm  Result Date: 01/17/2020 CLINICAL DATA:  Chest pain, shortness of breath EXAM: CT ANGIOGRAPHY CHEST WITH CONTRAST TECHNIQUE: Multidetector CT imaging of the chest was performed using the standard protocol during bolus administration of intravenous contrast. Multiplanar CT image reconstructions and MIPs were obtained to evaluate the vascular anatomy. CONTRAST:  15mL OMNIPAQUE IOHEXOL 350 MG/ML SOLN COMPARISON:  None. FINDINGS: Cardiovascular: Satisfactory opacification of the pulmonary arteries to the lobar level and some proximal segmental arteries. Evaluation is more limited at the lung bases. No evidence of pulmonary embolism. Cardiomegaly. Aortic valve replacement. No pericardial effusion. Thoracic aorta calcification. Coronary artery calcification. Mediastinum/Nodes: Nonspecific mildly enlarged right suprahilar lymph node. Esophagus is unremarkable. Lungs/Pleura: Imaging is in expiration. Patchy atelectasis is present. Probable scarring at the lung bases. Nonspecific peribronchial thickening. No pleural effusion or pneumothorax. Upper Abdomen: No acute abnormality. Musculoskeletal: No acute osseous abnormality. No significant chest wall finding. Review of the MIP images confirms the above findings. IMPRESSION: No evidence of acute pulmonary embolism within the above limitation. Imaging in expiration with patchy atelectasis. Nonspecific peribronchial thickening. Cardiomegaly. Aortic Atherosclerosis (ICD10-I70.0). Electronically Signed   By: Macy Mis M.D.   On: 01/17/2020 18:15    Procedures Procedures (including critical care time)  Medications Ordered in ED Medications  sodium chloride (PF) 0.9 % injection (has no administration in time range)  furosemide (LASIX) injection 20 mg (has no administration in time range)  iohexol (OMNIPAQUE) 350 MG/ML injection 100 mL (100 mLs Intravenous Contrast Given 01/17/20 1722)    ED Course  I have reviewed the triage vital signs and the nursing  notes.  Pertinent labs & imaging results that were available during my care of the patient were reviewed by me and considered in my medical decision making (see chart for details).    MDM Rules/Calculators/A&P                         Betty Chan is a 86 y.o. female with pertinent past medical history of aortic stenosis, dyslipidemia, hypertension that presents the emergency department via EMS from [place] states for shortness of breath.  Patient is not in respiratory stress, however is tachypneic and lungs do not sound clear with rales and wheezes throughout.  Patient does not have any lung or cardiac history.  Bilateral lower edema is baseline for patient.  Patient with normal neuro exam, alert and oriented x3 with no weakness or sensory deficit.  Patient was satting at 90% on room air, is now at 100% on 4 L.  Chest x-ray shows finding of pulmonary edema versus atypical infection.  Will obtain CT imaging to obtain better imaging.  CT PE study negative for clot, does show diffuse atelectasis.  No consolidation.  Patient does appear fluid overloaded,20 of IV Lasix given.  BNP 504.  CBC and CMP acutely normal.  EKG shows new left bundle branch, confirmed by Dr. Roslynn Amble.  First troponin 18, will consult hospitalist this time for admission most likely due to new onset congestive heart failure.  738 spoke to Dr. Marlowe Sax, she will accept the patient. She states that I do not need to speak to cardiology at this time.  The patient appears reasonably stabilized for admission considering the current resources, flow, and capabilities available in the ED at this time, and I doubt any other [facility] requiring further screening and/or treatment in the ED prior to admission.  I discussed this case with my attending physician who cosigned this note including patient's presenting symptoms, physical exam, and planned diagnostics and interventions. Attending physician stated agreement with plan or made changes to plan  which were implemented.   Attending physician assessed patient at bedside.  Final Clinical Impression(s) / ED Diagnoses Final diagnoses:  Hypoxia  New onset of congestive heart failure [facility])    Rx / DC Orders ED Discharge Orders    None       Alfredia Client, PA-C 01/17/20 2002    Lucrezia Starch, MD 01/19/20 [phone]

## 2020-01-17 NOTE — ED Notes (Signed)
ED TO INPATIENT HANDOFF REPORT  Name/Age/Gender Betty Chan 86 y.o. female  Code Status    Code Status Orders  (From admission, onward)         Start     Ordered   01/17/20 2029  Full code  Continuous        01/17/20 2030        Code Status History    Date Active Date Inactive Code Status Order ID Comments User Context   11/24/2014 1727 11/26/2014 1657 Full Code [id number]  [facility] Inpatient   Advance Care Planning Activity    Advance Directive Documentation     Most Recent Value  Type of Advance Directive Living will  Pre-existing out of facility DNR order (yellow form or pink MOST form) --  "MOST" Form in Place? --      Home/SNF/Other Nursing Home  Chief Complaint Acute on chronic respiratory failure with hypoxia ([place]) [J96.21]  Level of Care/Admitting Diagnosis ED Disposition    ED Disposition Condition [place]: [facility] [100102]  Level of Care: Telemetry [5]  Admit to tele based on following criteria: Monitor for Ischemic changes  May admit patient to [facility] or [facility] if equivalent level of care is available:: Yes  Covid Evaluation: Asymptomatic Screening Protocol (No Symptoms)  Diagnosis: Acute on chronic respiratory failure with hypoxia [facility]) [[id number]]  Admitting Physician: Shela Leff [[id number]]  Attending Physician: Shela Leff [[id number]]  Estimated length of stay: past midnight tomorrow  Certification:: I certify this patient will need inpatient services for at least 2 midnights       Medical History Past Medical History:  Diagnosis Date  . Aortic stenosis    s/p TAVR 08/2013 -follows at [facility] cards [facility])  . Basal cell carcinoma   . Cancer of right breast [facility]) 2004   lumpectomy  . Dyslipidemia   . GERD (gastroesophageal reflux disease)   . Heart murmur   . Hypertension   . Hypothyroid   . OA (osteoarthritis) of knee    "right" (11/24/2014)  . Osteopenia  02/13/2014   DEXA 01/2014: -2.2  . PHN (postherpetic neuralgia)    L anterior thigh 2007, recurrent 04/2013  . Seizure ([place]) 2007   single event, off Keppra since 11/2013; "not sure if it was a TIA or seizure"  . Squamous cell cancer of skin of forearm   . TIA (transient ischemic attack) 2007   "not sure if it was a TIA or seizure"    Allergies Allergies  Allergen Reactions  . Amoxicillin Rash  . Penicillins Rash    IV Location/Drains/Wounds Patient Lines/Drains/Airways Status    Active Line/Drains/Airways    Name Placement date Placement time Site Days   Peripheral IV 01/17/20 Left Hand 01/17/20  --  Hand  less than 1   Peripheral IV 01/17/20 Left Antecubital 01/17/20  1630  Antecubital  less than 1          Labs/Imaging Results for orders placed or performed during the hospital encounter of 01/17/20 (from the past 48 hour(s))  Comprehensive metabolic panel     Status: Abnormal   Collection Time: 01/17/20  3:00 PM  Result Value Ref Range   Sodium 136 135 - 145 mmol/L   Potassium 4.2 3.5 - 5.1 mmol/L   Chloride 101 98 - 111 mmol/L   CO2 27 22 - 32 mmol/L   Glucose, Bld 82 70 - 99 mg/dL    Comment: Glucose reference  range applies only to samples taken after fasting for at least 8 hours.   BUN 29 (H) 8 - 23 mg/dL   Creatinine, Ser 0.80 0.44 - 1.00 mg/dL   Calcium 8.8 (L) 8.9 - 10.3 mg/dL   Total Protein 7.6 6.5 - 8.1 g/dL   Albumin 3.9 3.5 - 5.0 g/dL   AST 28 15 - 41 U/L   ALT 26 0 - 44 U/L   Alkaline Phosphatase 56 38 - 126 U/L   Total Bilirubin 0.7 0.3 - 1.2 mg/dL   GFR calc non Af Amer >60 >60 mL/min   GFR calc Af Amer >60 >60 mL/min   Anion gap 8 5 - 15    Comment: Performed at [facility], [place] [address], [place], [place] [address]  CBC WITH DIFFERENTIAL     Status: Abnormal   Collection Time: 01/17/20  3:00 PM  Result Value Ref Range   WBC 5.3 4.0 - 10.5 K/uL   RBC 3.85 (L) 3.87 - 5.11 MIL/uL   Hemoglobin 12.1 12.0 - 15.0 g/dL   HCT 38.3  36 - 46 %   MCV 99.5 80.0 - 100.0 fL   MCH 31.4 26.0 - 34.0 pg   MCHC 31.6 30.0 - 36.0 g/dL   RDW 13.8 11.5 - 15.5 %   Platelets 215 150 - 400 K/uL   nRBC 0.0 0.0 - 0.2 %   Neutrophils Relative % 71 %   Neutro Abs 3.8 1.7 - 7.7 K/uL   Lymphocytes Relative 14 %   Lymphs Abs 0.8 0.7 - 4.0 K/uL   Monocytes Relative 12 %   Monocytes Absolute 0.6 0 - 1 K/uL   Eosinophils Relative 3 %   Eosinophils Absolute 0.1 0 - 0 K/uL   Basophils Relative 0 %   Basophils Absolute 0.0 0 - 0 K/uL   Immature Granulocytes 0 %   Abs Immature Granulocytes 0.01 0.00 - 0.07 K/uL    Comment: Performed at [facility], [place] [address], [place], [place] [address]  Troponin I (High Sensitivity)     Status: Abnormal   Collection Time: 01/17/20  3:00 PM  Result Value Ref Range   Troponin I (High Sensitivity) 18 (H) <18 ng/L    Comment: (NOTE) Elevated high sensitivity troponin I (hsTnI) values and significant  changes across serial measurements may suggest ACS but many other  chronic and acute conditions are known to elevate hsTnI results.  Refer to the "Links" section for chest pain algorithms and additional  guidance. Performed at [facility], [place] [address], [place], [place] [address]   SARS Coronavirus 2 by RT PCR (hospital order, performed in [facility] hospital lab) Nasopharyngeal Nasopharyngeal Swab     Status: None   Collection Time: 01/17/20  3:12 PM   Specimen: Nasopharyngeal Swab  Result Value Ref Range   SARS Coronavirus 2 NEGATIVE NEGATIVE    Comment: (NOTE) SARS-CoV-2 target nucleic acids are NOT DETECTED.  The SARS-CoV-2 RNA is generally detectable in upper and lower respiratory specimens during the acute phase of infection. The lowest concentration of SARS-CoV-2 viral copies this assay can detect is 250 copies / mL. A negative result does not preclude SARS-CoV-2 infection and should not be used as the sole basis for treatment or other patient  management decisions.  A negative result may occur with improper specimen collection / handling, submission of specimen other than nasopharyngeal swab, presence of viral mutation(s) within the areas targeted by this assay, and inadequate  number of viral copies (<250 copies / mL). A negative result must be combined with clinical observations, patient history, and epidemiological information.  Fact Sheet for Patients:   StrictlyIdeas.no  Fact Sheet for Healthcare Providers: BankingDealers.co.za  This test is not yet approved or  cleared by the [place] FDA and has been authorized for detection and/or diagnosis of SARS-CoV-2 by FDA under an Emergency Use Authorization (EUA).  This EUA will remain in effect (meaning this test can be used) for the duration of the COVID-19 declaration under Section 564(b)(1) of the Act, 21 U.S.C. section 360bbb-3(b)(1), unless the authorization is terminated or revoked sooner.  Performed at [facility], [place] [address] [place], [place] [address]   Brain natriuretic peptide     Status: Abnormal   Collection Time: 01/17/20  3:47 PM  Result Value Ref Range   B Natriuretic Peptide 504.5 (H) 0.0 - 100.0 pg/mL    Comment: Performed at [facility], [place] [address], [place], [place] [address]  Troponin I (High Sensitivity)     Status: Abnormal   Collection Time: 01/17/20  5:19 PM  Result Value Ref Range   Troponin I (High Sensitivity) 20 (H) <18 ng/L    Comment: (NOTE) Elevated high sensitivity troponin I (hsTnI) values and significant  changes across serial measurements may suggest ACS but many other  chronic and acute conditions are known to elevate hsTnI results.  Refer to the "Links" section for chest pain algorithms and additional  guidance. Performed at [facility], [place] [address] [place], [place] [address]    DG Chest 2 View  Result  Date: 01/17/2020 CLINICAL DATA:  Shortness of breath EXAM: CHEST - 2 VIEW COMPARISON:  None. FINDINGS: The cardiomediastinal silhouette is mildly enlarged in contour.Status post TAVR. Atherosclerotic calcifications the aorta. Tortuous thoracic aorta. Hiatal hernia. Elevation the RIGHT hemidiaphragm. Diffuse reticular prominence. Mild peribronchial cuffing. No pleural effusion. No pneumothorax. LEFT basilar linear opacities, likely atelectasis. Surgical clips of the RIGHT axilla. Visualized abdomen is unremarkable. Multilevel degenerative changes of the thoracic spine. IMPRESSION: 1. Constellation of findings are favored to reflect pulmonary edema scattered areas of atelectasis. Atypical infection could present similarly. Electronically Signed   By: Valentino Saxon MD   On: 01/17/2020 15:26   CT Angio Chest PE W/Cm &/Or Wo Cm  Result Date: 01/17/2020 CLINICAL DATA:  Chest pain, shortness of breath EXAM: CT ANGIOGRAPHY CHEST WITH CONTRAST TECHNIQUE: Multidetector CT imaging of the chest was performed using the standard protocol during bolus administration of intravenous contrast. Multiplanar CT image reconstructions and MIPs were obtained to evaluate the vascular anatomy. CONTRAST:  152mL OMNIPAQUE IOHEXOL 350 MG/ML SOLN COMPARISON:  None. FINDINGS: Cardiovascular: Satisfactory opacification of the pulmonary arteries to the lobar level and some proximal segmental arteries. Evaluation is more limited at the lung bases. No evidence of pulmonary embolism. Cardiomegaly. Aortic valve replacement. No pericardial effusion. Thoracic aorta calcification. Coronary artery calcification. Mediastinum/Nodes: Nonspecific mildly enlarged right suprahilar lymph node. Esophagus is unremarkable. Lungs/Pleura: Imaging is in expiration. Patchy atelectasis is present. Probable scarring at the lung bases. Nonspecific peribronchial thickening. No pleural effusion or pneumothorax. Upper Abdomen: No acute abnormality. Musculoskeletal:  No acute osseous abnormality. No significant chest wall finding. Review of the MIP images confirms the above findings. IMPRESSION: No evidence of acute pulmonary embolism within the above limitation. Imaging in expiration with patchy atelectasis. Nonspecific peribronchial thickening. Cardiomegaly. Aortic Atherosclerosis (ICD10-I70.0). Electronically Signed   By: Macy Mis M.D.   On: 01/17/2020 18:15  Pending Labs [employer] (From admission, onward) Comment          Start     Ordered   01/18/20 8841  Basic metabolic panel  Daily,   R      01/17/20 2030   01/17/20 1500  Urinalysis, Routine w reflex microscopic  ONCE - STAT,   STAT        01/17/20 1500          Vitals/Pain Today's Vitals   01/17/20 1519 01/17/20 1830 01/17/20 2030  BP: (!) 152/81 (!) 166/72 (!) 157/80  Pulse: 84 96 103  Resp: 15 (!) 11 18  Temp: 97.8 F (36.6 C)    TempSrc: Oral    SpO2: 100% 99% 97%    Isolation Precautions No active isolations  Medications Medications  sodium chloride (PF) 0.9 % injection (has no administration in time range)  sodium chloride flush (NS) 0.9 % injection 3 mL (has no administration in time range)  sodium chloride flush (NS) 0.9 % injection 3 mL (has no administration in time range)  0.9 %  sodium chloride infusion (has no administration in time range)  acetaminophen (TYLENOL) tablet 650 mg (has no administration in time range)  enoxaparin (LOVENOX) injection 40 mg (has no administration in time range)  furosemide (LASIX) injection 20 mg (has no administration in time range)  guaiFENesin (MUCINEX) 12 hr tablet 600 mg (has no administration in time range)  hydrALAZINE (APRESOLINE) injection 5 mg (has no administration in time range)  iohexol (OMNIPAQUE) 350 MG/ML injection 100 mL (100 mLs Intravenous Contrast Given 01/17/20 1722)  furosemide (LASIX) injection 20 mg (20 mg Intravenous Given 01/17/20 2057)    Mobility non-ambulatory

## 2020-01-17 NOTE — ED Notes (Signed)
Patient transported to X-ray 

## 2020-01-18 ENCOUNTER — Other Ambulatory Visit: Payer: Self-pay

## 2020-01-18 ENCOUNTER — Inpatient Hospital Stay (HOSPITAL_COMMUNITY): Payer: Medicare Other

## 2020-01-18 DIAGNOSIS — Z952 Presence of prosthetic heart valve: Secondary | ICD-10-CM

## 2020-01-18 DIAGNOSIS — I5043 Acute on chronic combined systolic (congestive) and diastolic (congestive) heart failure: Secondary | ICD-10-CM | POA: Diagnosis not present

## 2020-01-18 DIAGNOSIS — I5023 Acute on chronic systolic (congestive) heart failure: Secondary | ICD-10-CM

## 2020-01-18 DIAGNOSIS — R778 Other specified abnormalities of plasma proteins: Secondary | ICD-10-CM

## 2020-01-18 DIAGNOSIS — R0600 Dyspnea, unspecified: Secondary | ICD-10-CM

## 2020-01-18 DIAGNOSIS — J9601 Acute respiratory failure with hypoxia: Secondary | ICD-10-CM | POA: Diagnosis not present

## 2020-01-18 DIAGNOSIS — L8990 Pressure ulcer of unspecified site, unspecified stage: Secondary | ICD-10-CM | POA: Insufficient documentation

## 2020-01-18 LAB — BASIC METABOLIC PANEL
Anion gap: 9 (ref 5–15)
BUN: 22 mg/dL (ref 8–23)
CO2: 28 mmol/L (ref 22–32)
Calcium: 8.6 mg/dL — ABNORMAL LOW (ref 8.9–10.3)
Chloride: 98 mmol/L (ref 98–111)
Creatinine, Ser: 0.87 mg/dL (ref 0.44–1.00)
GFR calc Af Amer: >60 mL/min (ref >60)
GFR calc non Af Amer: 60 mL/min — ABNORMAL LOW (ref >60)
Glucose, Bld: 116 mg/dL — ABNORMAL HIGH (ref 70–99)
Potassium: 3.9 mmol/L (ref 3.5–5.1)
Sodium: 135 mmol/L (ref 135–145)

## 2020-01-18 LAB — MRSA PCR SCREENING: MRSA by PCR: NEGATIVE

## 2020-01-18 LAB — PROCALCITONIN: Procalcitonin: 0.66 ng/mL

## 2020-01-18 LAB — TSH: TSH: 1.6 u[IU]/mL (ref 0.350–4.500)

## 2020-01-18 LAB — MAGNESIUM: Magnesium: 1.8 mg/dL (ref 1.7–2.4)

## 2020-01-18 LAB — T4, FREE: Free T4: 0.79 ng/dL (ref 0.61–1.12)

## 2020-01-18 MED ORDER — FUROSEMIDE 10 MG/ML IJ SOLN
INTRAMUSCULAR | Status: AC
  Filled 2020-01-18: qty 2

## 2020-01-18 MED ORDER — FUROSEMIDE 10 MG/ML IJ SOLN
40.0000 mg | Freq: Two times a day (BID) | INTRAMUSCULAR | Status: DC
  Administered 2020-01-18 – 2020-01-19 (×3): 40 mg via INTRAVENOUS
  Filled 2020-01-18 (×2): qty 4

## 2020-01-18 MED ORDER — POTASSIUM CHLORIDE CRYS ER 20 MEQ PO TBCR
20.0000 meq | EXTENDED_RELEASE_TABLET | Freq: Two times a day (BID) | ORAL | Status: DC
  Administered 2020-01-18 – 2020-01-19 (×3): 20 meq via ORAL
  Filled 2020-01-18 (×3): qty 1

## 2020-01-18 MED ORDER — ATORVASTATIN CALCIUM 20 MG PO TABS
20.0000 mg | ORAL_TABLET | Freq: Every day | ORAL | Status: DC
  Administered 2020-01-18 – 2020-01-31 (×14): 20 mg via ORAL
  Filled 2020-01-18 (×14): qty 1

## 2020-01-18 MED ORDER — ORAL CARE MOUTH RINSE
15.0000 mL | Freq: Two times a day (BID) | OROMUCOSAL | Status: DC
  Administered 2020-01-18 – 2020-01-31 (×28): 15 mL via OROMUCOSAL

## 2020-01-18 MED ORDER — PANTOPRAZOLE SODIUM 40 MG PO TBEC
40.0000 mg | DELAYED_RELEASE_TABLET | Freq: Every day | ORAL | Status: DC
  Administered 2020-01-18 – 2020-01-31 (×14): 40 mg via ORAL
  Filled 2020-01-18 (×14): qty 1

## 2020-01-18 MED ORDER — MIRABEGRON ER 25 MG PO TB24
50.0000 mg | ORAL_TABLET | Freq: Every day | ORAL | Status: DC
  Administered 2020-01-18 – 2020-01-31 (×14): 50 mg via ORAL
  Filled 2020-01-18 (×14): qty 2

## 2020-01-18 MED ORDER — METOPROLOL SUCCINATE ER 25 MG PO TB24
25.0000 mg | ORAL_TABLET | Freq: Every day | ORAL | Status: DC
  Administered 2020-01-19: 25 mg via ORAL
  Filled 2020-01-18 (×2): qty 1

## 2020-01-18 MED ORDER — LEVOTHYROXINE SODIUM 50 MCG PO TABS
50.0000 ug | ORAL_TABLET | Freq: Every day | ORAL | Status: DC
  Administered 2020-01-18 – 2020-01-31 (×14): 50 ug via ORAL
  Filled 2020-01-18 (×14): qty 1

## 2020-01-18 MED ORDER — SODIUM CHLORIDE 0.9 % IV SOLN
1.0000 g | INTRAVENOUS | Status: DC
  Administered 2020-01-18 – 2020-01-22 (×5): 1 g via INTRAVENOUS
  Filled 2020-01-18 (×5): qty 1
  Filled 2020-01-18: qty 10

## 2020-01-18 NOTE — Consult Note (Addendum)
Cardiology Consultation:   Patient ID: Betty Chan MRN: [id number]; DOB: 01-Apr-1930  Admit date: 01/17/2020 Date of Consult: 01/18/2020  Primary Care Provider: Hoyt Koch, [facility] Cardiologist: Dr. Derinda [facility] at [facility] Electrophysiologist:  None    Patient Profile:   Betty Chan is a 86 y.o. female with a hx of HTN, HLD, severe aortic stenosis s/p TAVR 02/5637, chronic systolic dysfunction with EF 45% in 2016 (not on diuretic PTA), breast CA s/p lumpectomy, hypothyroidism, GERD, TIA vs seizure who is being seen today for the evaluation of SOB at the request of Dr. Bonner Chan.  History of Present Illness:  Betty Chan is followed at [facility] with above hx. She had a cath in 2013 showing normal coronary arteries. Pre-TAVR CT in 06/2013 patent coronaries without significant atherosclerotic disease. Last echo in 206 showed EF 45%, moderate LVH, grade 1 DD.  She presented to the [facility] with 5 day history of SOB, particularly with exertion, cough, and ankle swelling. This is per the chart as it is challenging to get a history out of her. When she attempts to speak it sounds as though she has lost her voice and has weak tone/respiratory effort while trying to speak. She is able to answer in brief answers but has rattly breath sounds and deep rattling cough. Upon arrival, she was found to have borderline O2 sat of 90% on RA, requiring supplemental O2. BNP was elevated at 504. Covid test negative. Chest x-ray with findings favored to reflect pulmonary edema versus atypical infection. CTA showed no PE, + evidence of patchy atelectasis, nonspecific peribronchial thickening, and cardiomegaly; no pleural effusion. hsTroponins 18-20. Otherwise labs nonacute except abnormal UA. She was hypertensive on admission but this has improved. Received 20mg  IV Lasix yesterday and written for 20mg  IV BID. Weight 171.9->169.3, I/O's not fully recorded although noted to have saturated the  bed after purewick leaked. Denies any recent chest pain.  Past Medical History:  Diagnosis Date  . Aortic stenosis    s/p TAVR 08/2013 -[facility] at [facility])  . Basal cell carcinoma   . Cancer of right breast [facility]) 2004   lumpectomy  . Dyslipidemia   . GERD (gastroesophageal reflux disease)   . Heart murmur   . Hypertension   . Hypothyroid   . OA (osteoarthritis) of knee    "right" (11/24/2014)  . Osteopenia 02/13/2014   DEXA 01/2014: -2.2  . PHN (postherpetic neuralgia)    L anterior thigh 2007, recurrent 04/2013  . Seizure ([place]) 2007   single event, off Keppra since 11/2013; "not sure if it was a TIA or seizure"  . Squamous cell cancer of skin of forearm   . TIA (transient ischemic attack) 2007   "not sure if it was a TIA or seizure"    Past Surgical History:  Procedure Laterality Date  . BASAL CELL CARCINOMA EXCISION     sternum  . BREAST BIOPSY Right 2004  . BREAST LUMPECTOMY Right 08/2002 X 2  . CARDIAC CATHETERIZATION  "several"  . CARDIAC VALVE REPLACEMENT    . CATARACT EXTRACTION W/ INTRAOCULAR LENS  IMPLANT, BILATERAL Bilateral 03/2014-07/2014  . SQUAMOUS CELL CARCINOMA EXCISION Left 06/2014   'forearm"  . TONSILLECTOMY    . TRANSCATHETER AORTIC VALVE REPLACEMENT, TRANSFEMORAL  09/06/13   [facility]     Home Medications:  Prior to Admission medications   Medication Sig Start Date End Date Taking? Authorizing Provider  acetaminophen (TYLENOL) 500 MG tablet Take 1 tablet (500 mg total) by  mouth every 6 (six) hours as needed for fever. Patient taking differently: Take 500 mg by mouth every 8 (eight) hours.  06/26/14  Yes Betty Clack, Chan  aspirin 81 MG tablet Take 81 mg by mouth daily.   Yes [facility]  atorvastatin (LIPITOR) 20 MG tablet TAKE 1 TABLET BY MOUTH EVERY DAY AT 6 PM Patient taking differently: Take 20 mg by mouth daily.  07/25/14  Yes Betty Clack, Chan  Biotin 5000 MCG CAPS Take 5,000 mcg by mouth daily.   Yes Provider, Historical,  Chan  carbidopa-levodopa (SINEMET IR) 25-100 MG tablet Take 2 tablets by mouth 3 (three) times daily. 10/21/17  Yes Chan, Betty Quail, Chan  cholecalciferol (VITAMIN D) 1000 UNITS tablet Take 1,000 Units by mouth 2 (two) times daily.    Yes [facility]  clindamycin (CLEOCIN) 150 MG capsule Take 600 mg by mouth as needed (1 hour prior to dental appointment).  11/07/14  Yes [facility]  levothyroxine (SYNTHROID, LEVOTHROID) 50 MCG tablet Take 1 tablet (50 mcg total) by mouth daily before breakfast. 03/28/14  Yes Betty Clack, Chan  lisinopril (PRINIVIL,ZESTRIL) 5 MG tablet Take 1 tablet (5 mg total) by mouth daily. Annual visit due in Jan for further refills 03/10/17  Yes Betty Koch, Chan  metoprolol succinate (TOPROL-XL) 25 MG 24 hr tablet Take 25 mg by mouth daily. 01/16/20  Yes [facility]  mirabegron ER (MYRBETRIQ) 50 MG TB24 tablet Take 50 mg by mouth daily.   Yes [facility]  Multiple Vitamins-Minerals (CENTRUM FLAVOR BURST ADULT) CHEW Chew 2 tablets by mouth daily.   Yes [facility]  omeprazole (PRILOSEC) 20 MG capsule Take 1 capsule (20 mg total) by mouth daily. 03/28/14  Yes Betty Clack, Chan  TURMERIC PO Take 1 tablet by mouth daily.   Yes [facility]  diclofenac sodium (VOLTAREN) 1 % GEL APPLY 2 GRAMS TOPICALLY 3 (THREE) TIMES DAILY. Patient not taking: Reported on 01/17/2020 04/13/17   [facility]  doxycycline (VIBRA-TABS) 100 MG tablet Take 1 tablet (100 mg total) by mouth 2 (two) times daily. Patient not taking: Reported on 01/17/2020 03/30/18   [facility]  metoprolol tartrate (LOPRESSOR) 25 MG tablet Take 0.5 tablets (12.5 mg total) by mouth 2 (two) times daily. Patient not taking: Reported on 01/17/2020 02/24/17   Betty Koch, Chan  oxybutynin (DITROPAN) 5 MG tablet Take 2 tablets (10 mg total) by mouth 2 (two) times daily. Patient not taking: Reported on 01/17/2020 01/17/16    Betty Koch, Chan    Inpatient Medications: Scheduled Meds: . enoxaparin (LOVENOX) injection  40 mg Subcutaneous Q24H  . furosemide  20 mg Intravenous BID  . guaiFENesin  600 mg Oral BID  . mouth rinse  15 mL Mouth Rinse BID  . sodium chloride flush  3 mL Intravenous Q12H   Continuous Infusions: . sodium chloride     PRN Meds: sodium chloride, acetaminophen, hydrALAZINE, sodium chloride flush  Allergies:    Allergies  Allergen Reactions  . Amoxicillin Rash  . Penicillins Rash    Social History:   Social History   Socioeconomic History  . Marital status: Widowed    Spouse name: Not on file  . Number of children: Not on file  . Years of education: Not on file  . Highest education level: Not on file  Occupational History  . Occupation: retired    Comment: Network engineer  Tobacco Use  .  Smoking status: Never Smoker  . Smokeless tobacco: Never Used  . Tobacco comment: independent living with assistance  Substance and Sexual Activity  . Alcohol use: No  . Drug use: No  . Sexual activity: Never  Other Topics Concern  . Not on file  Social History Narrative  . Not on file   Social Determinants of Health   Financial Resource Strain:   . Difficulty of Paying Living Expenses:   Food Insecurity:   . Worried About Charity fundraiser in the Last Year:   . Arboriculturist in the Last Year:   Transportation Needs:   . Film/video editor (Medical):   Marland Kitchen Lack of Transportation (Non-Medical):   Physical Activity:   . Days of Exercise per Week:   . Minutes of Exercise per Session:   Stress:   . Feeling of Stress :   Social Connections:   . Frequency of Communication with Friends and Family:   . Frequency of Social Gatherings with Friends and Family:   . Attends Religious Services:   . Active Member of Clubs or Organizations:   . Attends Archivist Meetings:   Marland Kitchen Marital Status:   Intimate Partner Violence:   . Fear of Current or Ex-Partner:   .  Emotionally Abused:   Marland Kitchen Physically Abused:   . Sexually Abused:     Family History:    Family History  Problem Relation Age of Onset  . Prostate cancer Father   . Breast cancer Sister   . Stroke Mother 100  . Dementia Sister      ROS:  Limited given patient's ability to speak clearly. Denies fever or chills.  Physical Exam/Data:   Vitals:   01/17/20 2226 01/17/20 2336 01/18/20 0148 01/18/20 0526  BP: (!) 162/74 (!) 140/63 119/67 (!) 123/50  Pulse: (!) 110 102 94 90  Resp: 23 19 20 18   Temp: 97.6 F (36.4 C) 97.9 F (36.6 C) 98.8 F (37.1 C) 98.8 F (37.1 C)  TempSrc: Oral Oral Axillary   SpO2: 98% 94% 96% 95%  Weight:    76.8 kg  Height:        Intake/Output Summary (Last 24 hours) at 01/18/2020 0938 Last data filed at 01/18/2020 0716 Gross per 24 hour  Intake 3 ml  Output 300 ml  Net -297 ml   Last 3 Weights 01/18/2020 01/17/2020 05/18/2017  Weight (lbs) 169 lb 5 oz 171 lb 15.3 oz (No Data)  Weight (kg) 76.8 kg 78 kg (No Data)     Body mass index is 26.52 kg/m.  General: Elderly WF in no acute distress but with rattly breath sounds and deep gurgling cough Head: Normocephalic, atraumatic, sclera non-icteric, no xanthomas, nares are without discharge. Neck: Negative for carotid bruits. JVP appears slightly elevated. Lungs: Breathing is unlabored but with diffuse rhonchorous breath sounds, making auscultation of other sounds impossible. No wheezing Heart: RRR S1 S2 without murmurs, rubs, or gallops.  Abdomen: Soft, non-tender, non-distended with normoactive bowel sounds. No rebound/guarding. Extremities: No clubbing or cyanosis. 1+ BLE edema. Distal pedal pulses are 2+ and equal bilaterally. Neuro: Alert and oriented X 3 but challenging to understand patient due to weak tone behind voice, only able to understand patient when she speaks a few words at a time. Follows commands Psych: Calm  EKG:  The EKG was personally reviewed and demonstrates:  NSR 81bpm,  borderline LBBB type pattern, occasional PVCs, nonspecific STTW changes in V4-V6  Telemetry:  Telemetry was personally  reviewed and demonstrates:  NSR with frequent PVCs  Relevant CV Studies: 2D echo pending  Laboratory Data:  High Sensitivity Troponin:   Recent Labs  Lab 01/17/20 1500 01/17/20 1719  TROPONINIHS 18* 20*     Chemistry Recent Labs  Lab 01/17/20 1500 01/18/20 0429  NA 136 135  K 4.2 3.9  CL 101 98  CO2 27 28  GLUCOSE 82 116*  BUN 29* 22  CREATININE 0.80 0.87  CALCIUM 8.8* 8.6*  GFRNONAA >60 60*  GFRAA >60 >60  ANIONGAP 8 9    Recent Labs  Lab 01/17/20 1500  PROT 7.6  ALBUMIN 3.9  AST 28  ALT 26  ALKPHOS 56  BILITOT 0.7   Hematology Recent Labs  Lab 01/17/20 1500  WBC 5.3  RBC 3.85*  HGB 12.1  HCT 38.3  MCV 99.5  MCH 31.4  MCHC 31.6  RDW 13.8  PLT 215   BNP Recent Labs  Lab 01/17/20 1547  BNP 504.5*    DDimer No results for input(s): DDIMER in the last 168 hours.   Radiology/Studies:  DG Chest 2 View  Result Date: 01/17/2020 CLINICAL DATA:  Shortness of breath EXAM: CHEST - 2 VIEW COMPARISON:  None. FINDINGS: The cardiomediastinal silhouette is mildly enlarged in contour.Status post TAVR. Atherosclerotic calcifications the aorta. Tortuous thoracic aorta. Hiatal hernia. Elevation the RIGHT hemidiaphragm. Diffuse reticular prominence. Mild peribronchial cuffing. No pleural effusion. No pneumothorax. LEFT basilar linear opacities, likely atelectasis. Surgical clips of the RIGHT axilla. Visualized abdomen is unremarkable. Multilevel degenerative changes of the thoracic spine. IMPRESSION: 1. Constellation of findings are favored to reflect pulmonary edema scattered areas of atelectasis. Atypical infection could present similarly. Electronically Signed   By: Valentino Saxon Chan   On: 01/17/2020 15:26   CT Angio Chest PE W/Cm &/Or Wo Cm  Result Date: 01/17/2020 CLINICAL DATA:  Chest pain, shortness of breath EXAM: CT ANGIOGRAPHY CHEST  WITH CONTRAST TECHNIQUE: Multidetector CT imaging of the chest was performed using the standard protocol during bolus administration of intravenous contrast. Multiplanar CT image reconstructions and MIPs were obtained to evaluate the vascular anatomy. CONTRAST:  167mL OMNIPAQUE IOHEXOL 350 MG/ML SOLN COMPARISON:  None. FINDINGS: Cardiovascular: Satisfactory opacification of the pulmonary arteries to the lobar level and some proximal segmental arteries. Evaluation is more limited at the lung bases. No evidence of pulmonary embolism. Cardiomegaly. Aortic valve replacement. No pericardial effusion. Thoracic aorta calcification. Coronary artery calcification. Mediastinum/Nodes: Nonspecific mildly enlarged right suprahilar lymph node. Esophagus is unremarkable. Lungs/Pleura: Imaging is in expiration. Patchy atelectasis is present. Probable scarring at the lung bases. Nonspecific peribronchial thickening. No pleural effusion or pneumothorax. Upper Abdomen: No acute abnormality. Musculoskeletal: No acute osseous abnormality. No significant chest wall finding. Review of the MIP images confirms the above findings. IMPRESSION: No evidence of acute pulmonary embolism within the above limitation. Imaging in expiration with patchy atelectasis. Nonspecific peribronchial thickening. Cardiomegaly. Aortic Atherosclerosis (ICD10-I70.0). Electronically Signed   By: Macy Mis M.D.   On: 01/17/2020 18:15    New York Heart Association (NYHA) Functional Class NYHA Class IV Upon arrival, baseline unknown  Assessment and Plan:   1. Acute hypoxic respiratory failure with potential acute combined CHF; prior LVEF 45% - patient sounds terrible this morning with deep rattly cough and gurgling breath sounds although no increased WOB, stable O2 sat on nasal cannula this morning - will increase Lasix to 40mg  IV BID - Covid negative - add respiratory panel - minimally elevated troponin not felt to represent ACS at this time (  known  normal coronaries 2015, 2016) - also discussed with Dr. Tana Coast as she may need additional pulmonary hygiene measures in case of potential atypical infection as noted on CXR/CT. Dr. Tana Coast also indicates plans to involve palliative team early to investigate goals of care  2. History of TAVR - 2D echocardiogram pending  3. Essential HTN - increased on admission, now normal - follow in context of above  4. Hypothyroidism - check thyroid function  5. Abnormal UA with + small leuks, + nitrite, many bacteria - will defer to primary team  6. Frequent PVCs noted on telemetry - question contribution to dyspnea although clearly not the whole picture - echo pending - K 3.9 - given ongoing IV Lasix will start 34meq BID - check Mg and recommend repletion to keep goal 2.0 or greater - consider low dose BB as blood pressure allows, but await improvement of respiratory status first  For questions or updates, please contact [facility] Please consult [email or web address] for contact info under    Signed, Charlie Pitter, PA-C  01/18/2020 9:38 AM   I have seen and examined the patient along with Charlie Pitter, PA-C .  I have reviewed the chart, notes and new data.  I agree with PA/NP's note.  Key new complaints: appears chronically and acutely ill; she is difficult to wake up and even harder to understand. Gurgly weak cough and wet rattling breaths Key examination changes: upper airway coarse sounds, no basilar rales or wheezes, hard to hear heart sounds, RRR with frequent ectopy, faint Ao ej murmur, no diastolic murmurs, mild peripheral edema Key new findings / data: mild BNP elevation (baseline unknown), imaging studies without frank pulmonary edema.  Notes from Dr. Mina Chan in 2019 document declining functional status due to both physical limitations and concerns for neurological impairment.  PLAN: I am concerned that she has chronic aspiration, more so than heart failure. Echo pending. Prognosis likely to  be poor. Palliative care consultation appears appropriate.  Sanda Klein, Chan, [place] [phone] 01/18/2020, 1:14 PM

## 2020-01-18 NOTE — Progress Notes (Signed)
  Echocardiogram 2D Echocardiogram has been performed.  Betty [facility] G [facility] 01/18/2020, 3:09 PM

## 2020-01-18 NOTE — Progress Notes (Signed)
SLP Cancellation Note  Patient Details Name: Henrine Hayter MRN: [id number] DOB: 13-Oct-1929   Cancelled treatment:       Reason Eval/Treat Not Completed: Patient at procedure or test/unavailable. Pt currently having ultrasound in room. Will continue efforts.  Mayleigh Tetrault B. Quentin Ore, [facility], [place] Speech Language Pathologist Office: [phone]  Shonna Chock 01/18/2020, 2:50 PM

## 2020-01-18 NOTE — Progress Notes (Signed)
   01/18/20 1700  Take Vital Signs  Increase Vital Sign Frequency  Red: Q 1hr X 4 then Q 4hr X 4, if remains red, continue Q 4hrs  Escalate  MEWS: Escalate Red: discuss with charge nurse/RN and provider, consider discussing with RRT  Notify: Charge Nurse/RN  Name of Charge Nurse/RN Notified Sharyn Blitz, RN  Date Charge Nurse/RN Notified 01/18/20  Time Charge Nurse/RN Notified 1719  Notify: Provider  Provider Name/Title Rai  Date Provider Notified 01/18/20  Time Provider Notified 1720  Notification Type Page  Notification Reason Change in status  Response Other (Comment) (1800 Lasix dose given; respiratory called; nonrebreather)  Date of Provider Response 01/18/20  Time of Provider Response 1710  Document  Patient Outcome Stabilized after interventions  Progress note created (see row info) Yes   Above is remaining RED MEWS data from previous note

## 2020-01-18 NOTE — Progress Notes (Addendum)
[facility]                                                                              Patient Demographics  Betty Chan, is a 86 y.o. female, DOB - 04/01/1930, BJS:[id number]  Admit date - 01/17/2020   Admitting Physician Shela Leff, MD  Outpatient Primary MD for the patient is Hoyt Koch, MD  Outpatient specialists:   LOS - 1  days   Medical records reviewed and are as summarized below:    Chief Complaint  Patient presents with  . Shortness of Breath       Brief summary   Patient is 86 year old female with history of severe aortic stenosis status post TAVR in 2015, breast CA status post lumpectomy, hypertension, hyperlipidemia, hypothyroidism, GERD presented with shortness of breath.  Patient reported 5-day history of dyspnea, worse with exertion.  Also coughing and ankles have been swollen.  Denies prior history of COPD or CHF. Chest x-ray showed pulmonary edema versus atypical infection CTA chest negative for PE, showed patchy atelectasis, nonspecific peribronchial thickening and cardiomegaly, BNP elevated 504.  Assessment & Plan    Principal Problem: Acute respiratory failure with hypoxia secondary to acute on chronic systolic and diastolic exacerbation of CHF (congestive heart failure) (HCC) -At the time of my examination, visibly dyspneic, coarse breath sounds throughout, patient was placed on 4 L O2. -History of severe aortic stenosis status post TAVR, echo in 2016 ([facility]) showed EF 60%, grade 1 diastolic dysfunction -Follow 2D echo, repeat chest x-ray, follow respiratory virus panel, procalcitonin - IV Lasix for diuresis, cardiology consulted, increased Lasix to 40 mg IV twice daily -Strict I's and O's and daily weights -Covid negative, however given multiple comorbidities, new respiratory failure with hypoxia, age, high risk for mortality, palliative medicine consulted for goals of care. -SLP evaluation to rule out  aspiration  Active Problems: Elevated troponin -Mildly elevated troponin likely due to #1, follow 2D echo -Currently no chest pain, cardiology following  UTI -UA showed positive nitrite, leukocytes, many bacteria, > 50 WBC -Follow urine culture and sensitivities, placed on IV Rocephin   Hypothyroidism -Continue Synthroid, follow TSH  Essential hypertension -Continue Lasix, resume metoprolol  History of aortic stenosis status post TAVR in 2015 -Follow 2D echo, cardiology following  Generalized debility, Parkinson's disease  -Very deconditioned, needs goals of care, PT evaluation - per daughter, wheel chair bound for last 4-5 years, otherwise cognitively intact, has a hard time vocalizing (whispering is chronic for the patient per daughter)    Pressure injury of skin -Right heel stage I, POA   Code Status: Full CODE STATUS DVT Prophylaxis:  Lovenox Family Communication: Discussed all imaging results, lab results, explained to the patient and daughter, [name] on the phone.   Disposition Plan:     Status is: Inpatient  Remains inpatient appropriate because:Inpatient level of care appropriate due to severity of illness   Dispo: The patient is from: SNF              Anticipated d/c is to: SNF              Anticipated d/c date is: >  3 days              Patient currently is not medically stable to d/c.   Time Spent in minutes   35  Procedures:  none  Consultants:   Cardiology Palliative  Antimicrobials:   Anti-infectives (From admission, onward)   Start     Dose/Rate Route Frequency Ordered Stop   01/18/20 1100  cefTRIAXone (ROCEPHIN) 1 g in sodium chloride 0.9 % 100 mL IVPB     Discontinue     1 g 200 mL/hr over 30 Minutes Intravenous Every 24 hours 01/18/20 1010            Medications  Scheduled Meds: . enoxaparin (LOVENOX) injection  40 mg Subcutaneous Q24H  . furosemide  40 mg Intravenous BID  . guaiFENesin  600 mg Oral BID  . mouth rinse   15 mL Mouth Rinse BID  . potassium chloride  20 mEq Oral BID  . sodium chloride flush  3 mL Intravenous Q12H   Continuous Infusions: . sodium chloride    . cefTRIAXone (ROCEPHIN)  IV     PRN Meds:.sodium chloride, acetaminophen, hydrALAZINE, sodium chloride flush      Subjective:   Carlton Sweaney was seen and examined today.  Coarse breath sounds throughout with gurgling sounds.  No chest pain.  No fevers or chills.  Difficulty comprehending review of systems due to hard time vocalizing.  No acute events overnight  Objective:   Vitals:   01/17/20 2226 01/17/20 2336 01/18/20 0148 01/18/20 0526  BP: (!) 162/74 (!) 140/63 119/67 (!) 123/50  Pulse: (!) 110 102 94 90  Resp: 23 19 20 18   Temp: 97.6 F (36.4 C) 97.9 F (36.6 C) 98.8 F (37.1 C) 98.8 F (37.1 C)  TempSrc: Oral Oral Axillary   SpO2: 98% 94% 96% 95%  Weight:    76.8 kg  Height:        Intake/Output Summary (Last 24 hours) at 01/18/2020 1024 Last data filed at 01/18/2020 4193 Gross per 24 hour  Intake 3 ml  Output 300 ml  Net -297 ml     Wt Readings from Last 3 Encounters:  01/18/20 76.8 kg  08/25/16 74.4 kg  08/18/16 76.2 kg     Exam  General: Alert and awake, oriented to self however difficulty comprehending  Cardiovascular: S1 S2 auscultated, no murmurs, RRR  Respiratory: Bibasilar Rales with coarse breath sounds  Gastrointestinal: Soft, nontender, nondistended, + bowel sounds  Ext: 2+ pedal edema bilaterally  Neuro: alert and awake however whispering, difficulty vocalizing  Musculoskeletal: No digital cyanosis, clubbing  Skin: No rashes  Psych: alert and awake   Data Reviewed:  I have personally reviewed following labs and imaging studies  Micro Results Recent Results (from the past 240 hour(s))  SARS Coronavirus 2 by RT PCR (hospital order, performed in [place] hospital lab) Nasopharyngeal Nasopharyngeal Swab     Status: None   Collection Time: 01/17/20  3:12 PM   Specimen:  Nasopharyngeal Swab  Result Value Ref Range Status   SARS Coronavirus 2 NEGATIVE NEGATIVE Final    Comment: (NOTE) SARS-CoV-2 target nucleic acids are NOT DETECTED.  The SARS-CoV-2 RNA is generally detectable in upper and lower respiratory specimens during the acute phase of infection. The lowest concentration of SARS-CoV-2 viral copies this assay can detect is 250 copies / mL. A negative result does not preclude SARS-CoV-2 infection and should not be used as the sole basis for treatment or other patient management decisions.  A  negative result may occur with improper specimen collection / handling, submission of specimen other than nasopharyngeal swab, presence of viral mutation(s) within the areas targeted by this assay, and inadequate number of viral copies (<250 copies / mL). A negative result must be combined with clinical observations, patient history, and epidemiological information.  Fact Sheet for Patients:   StrictlyIdeas.no  Fact Sheet for Healthcare Providers: BankingDealers.co.za  This test is not yet approved or  cleared by the [place] FDA and has been authorized for detection and/or diagnosis of SARS-CoV-2 by FDA under an Emergency Use Authorization (EUA).  This EUA will remain in effect (meaning this test can be used) for the duration of the COVID-19 declaration under Section 564(b)(1) of the Act, 21 U.S.C. section 360bbb-3(b)(1), unless the authorization is terminated or revoked sooner.  Performed at [facility], [place] [address], [place], [place] [address]   MRSA PCR Screening     Status: None   Collection Time: 01/17/20 11:33 PM   Specimen: Nasal Mucosa; Nasopharyngeal  Result Value Ref Range Status   MRSA by PCR NEGATIVE NEGATIVE Final    Comment:        The GeneXpert MRSA Assay (FDA approved for NASAL specimens only), is one component of a comprehensive MRSA  colonization surveillance program. It is not intended to diagnose MRSA infection nor to guide or monitor treatment for MRSA infections. Performed at [facility], [place] [address] [place], [place] [address]     Radiology Reports DG Chest 2 View  Result Date: 01/17/2020 CLINICAL DATA:  Shortness of breath EXAM: CHEST - 2 VIEW COMPARISON:  None. FINDINGS: The cardiomediastinal silhouette is mildly enlarged in contour.Status post TAVR. Atherosclerotic calcifications the aorta. Tortuous thoracic aorta. Hiatal hernia. Elevation the RIGHT hemidiaphragm. Diffuse reticular prominence. Mild peribronchial cuffing. No pleural effusion. No pneumothorax. LEFT basilar linear opacities, likely atelectasis. Surgical clips of the RIGHT axilla. Visualized abdomen is unremarkable. Multilevel degenerative changes of the thoracic spine. IMPRESSION: 1. Constellation of findings are favored to reflect pulmonary edema scattered areas of atelectasis. Atypical infection could present similarly. Electronically Signed   By: Valentino Saxon MD   On: 01/17/2020 15:26   CT Angio Chest PE W/Cm &/Or Wo Cm  Result Date: 01/17/2020 CLINICAL DATA:  Chest pain, shortness of breath EXAM: CT ANGIOGRAPHY CHEST WITH CONTRAST TECHNIQUE: Multidetector CT imaging of the chest was performed using the standard protocol during bolus administration of intravenous contrast. Multiplanar CT image reconstructions and MIPs were obtained to evaluate the vascular anatomy. CONTRAST:  110mL OMNIPAQUE IOHEXOL 350 MG/ML SOLN COMPARISON:  None. FINDINGS: Cardiovascular: Satisfactory opacification of the pulmonary arteries to the lobar level and some proximal segmental arteries. Evaluation is more limited at the lung bases. No evidence of pulmonary embolism. Cardiomegaly. Aortic valve replacement. No pericardial effusion. Thoracic aorta calcification. Coronary artery calcification. Mediastinum/Nodes: Nonspecific mildly enlarged right  suprahilar lymph node. Esophagus is unremarkable. Lungs/Pleura: Imaging is in expiration. Patchy atelectasis is present. Probable scarring at the lung bases. Nonspecific peribronchial thickening. No pleural effusion or pneumothorax. Upper Abdomen: No acute abnormality. Musculoskeletal: No acute osseous abnormality. No significant chest wall finding. Review of the MIP images confirms the above findings. IMPRESSION: No evidence of acute pulmonary embolism within the above limitation. Imaging in expiration with patchy atelectasis. Nonspecific peribronchial thickening. Cardiomegaly. Aortic Atherosclerosis (ICD10-I70.0). Electronically Signed   By: Macy Mis M.D.   On: 01/17/2020 18:15    Lab Data:  CBC: Recent Labs  Lab 01/17/20 1500  WBC 5.3  NEUTROABS 3.8  HGB 12.1  HCT 38.3  MCV 99.5  PLT 597   Basic Metabolic Panel: Recent Labs  Lab 01/17/20 1500 01/18/20 0429  NA 136 135  K 4.2 3.9  CL 101 98  CO2 27 28  GLUCOSE 82 116*  BUN 29* 22  CREATININE 0.80 0.87  CALCIUM 8.8* 8.6*   GFR: Estimated Creatinine Clearance: 48.7 mL/min (by C-G formula based on SCr of 0.87 mg/dL). Liver Function Tests: Recent Labs  Lab 01/17/20 1500  AST 28  ALT 26  ALKPHOS 56  BILITOT 0.7  PROT 7.6  ALBUMIN 3.9   No results for input(s): LIPASE, AMYLASE in the last 168 hours. No results for input(s): AMMONIA in the last 168 hours. Coagulation Profile: No results for input(s): INR, PROTIME in the last 168 hours. Cardiac Enzymes: No results for input(s): CKTOTAL, CKMB, CKMBINDEX, TROPONINI in the last 168 hours. BNP (last 3 results) No results for input(s): PROBNP in the last 8760 hours. HbA1C: No results for input(s): HGBA1C in the last 72 hours. CBG: No results for input(s): GLUCAP in the last 168 hours. Lipid Profile: No results for input(s): CHOL, HDL, LDLCALC, TRIG, CHOLHDL, LDLDIRECT in the last 72 hours. Thyroid Function Tests: Recent Labs    01/18/20 0429  TSH 1.600    Anemia Panel: No results for input(s): VITAMINB12, FOLATE, FERRITIN, TIBC, IRON, RETICCTPCT in the last 72 hours. Urine analysis:    Component Value Date/Time   COLORURINE YELLOW 01/17/2020 2130   APPEARANCEUR HAZY (A) 01/17/2020 2130   LABSPEC 1.010 01/17/2020 2130   PHURINE 5.5 01/17/2020 2130   GLUCOSEU NEGATIVE 01/17/2020 2130   HGBUR TRACE (A) 01/17/2020 2130   BILIRUBINUR NEGATIVE 01/17/2020 2130   KETONESUR NEGATIVE 01/17/2020 2130   PROTEINUR NEGATIVE 01/17/2020 2130   UROBILINOGEN 1.0 11/25/2014 0220   NITRITE POSITIVE (A) 01/17/2020 2130   LEUKOCYTESUR SMALL (A) 01/17/2020 2130     Renee Beale M.D. [facility] 01/18/2020, 10:24 AM   Call night coverage person covering after 7pm

## 2020-01-18 NOTE — Progress Notes (Signed)
   01/17/20 2226  Assess: MEWS Score  Temp 97.6 F (36.4 C)  BP (!) 162/74  Pulse Rate (!) 110  Resp 23  Level of Consciousness Alert  SpO2 98 %  O2 Device Nasal Cannula  O2 Flow Rate (L/min) 4 L/min  Assess: MEWS Score  MEWS Temp 0  MEWS Systolic 0  MEWS Pulse 1  MEWS RR 1  MEWS LOC 0  MEWS Score 2  MEWS Score Color Yellow  Assess: if the MEWS score is Yellow or Red  Were vital signs taken at a resting state? Yes  Focused Assessment No change from prior assessment  Early Detection of Sepsis Score *See Row Information* Low  MEWS guidelines implemented *See Row Information* Yes  Treat  MEWS Interventions Other (Comment);Escalated (See documentation below) Insurance account manager)

## 2020-01-18 NOTE — Progress Notes (Signed)
Purewick leaked and the entire bed was saturated this morning. Gave full bed and changed linen. Patient stated that she didn't even feel wet. Will continue to monitor.   Urine culture is in the lab being processed.

## 2020-01-18 NOTE — Evaluation (Signed)
Clinical/Bedside Swallow Evaluation Patient Details  Name: Betty Chan MRN: [id number] Date of Birth: Nov 13, 1929  Today's Date: 01/18/2020 Time: SLP Start Time (ACUTE ONLY): 31 SLP Stop Time (ACUTE ONLY): 1555 SLP Time Calculation (min) (ACUTE ONLY): 25 min  Past Medical History:  Past Medical History:  Diagnosis Date  . Aortic stenosis    s/p TAVR 08/2013 -follows at [facility] cards [facility])  . Basal cell carcinoma   . Cancer of right breast [facility]) 2004   lumpectomy  . Dyslipidemia   . GERD (gastroesophageal reflux disease)   . Heart murmur   . Hypertension   . Hypothyroid   . OA (osteoarthritis) of knee    "right" (11/24/2014)  . Osteopenia 02/13/2014   DEXA 01/2014: -2.2  . PHN (postherpetic neuralgia)    L anterior thigh 2007, recurrent 04/2013  . Seizure ([place]) 2007   single event, off Keppra since 11/2013; "not sure if it was a TIA or seizure"  . Squamous cell cancer of skin of forearm   . TIA (transient ischemic attack) 2007   "not sure if it was a TIA or seizure"   Past Surgical History:  Past Surgical History:  Procedure Laterality Date  . BASAL CELL CARCINOMA EXCISION     sternum  . BREAST BIOPSY Right 2004  . BREAST LUMPECTOMY Right 08/2002 X 2  . CARDIAC CATHETERIZATION  "several"  . CARDIAC VALVE REPLACEMENT    . CATARACT EXTRACTION W/ INTRAOCULAR LENS  IMPLANT, BILATERAL Bilateral 03/2014-07/2014  . SQUAMOUS CELL CARCINOMA EXCISION Left 06/2014   'forearm"  . TONSILLECTOMY    . TRANSCATHETER AORTIC VALVE REPLACEMENT, TRANSFEMORAL  09/06/13   [facility]   HPI:  86yo female admitted 01/17/20 with SOB, cough, BLE edema - suspect CHF exacerbation. PMH: severe aortic stenosis s/p TAVR (2015) breast cancer, HTN, HLD, hypothyroidism, GERD, OA, osteopenia, seizure, TIA (2007). CXR = no evidence of acute disease.   Assessment / Plan / Recommendation Clinical Impression  Pt was sleeping upon arrival of SLP. Open mouth breathing noted, with crackly breath sounds. Pt awakened  easily, and was receptive to having oral care completed. Suction was set up to facilitate effective oral care. Following oral care, pt accepted trials of thin liquid and puree textures. No overt s/s aspiration observed despite being challenged with multiple consecutive boluses of thin liquid and several boluses of puree. However, pt respiratory status is concerning, and may be prohibitive to safe po intake at this time. Given pt weak nonproductive volitional cough, I fear that if she does aspirate, she will not have the strength to remove it from her airway, further compromising respiratory status.   Recommend continuing with NPO status except for ice chips and sips of water for oral comfort, given after oral care only when pt is fully awake and alert. SLP will continue to assess readiness for PO intake as respiratory status improves. RN and MD informed.   SLP Visit Diagnosis: Dysphagia, unspecified (R13.10)    Aspiration Risk  Moderate aspiration risk    Diet Recommendation NPO   Medication Administration: Via alternative means    Other  Recommendations Oral Care Recommendations: Oral care prior to ice chip/H20;Oral care QID;Staff/trained caregiver to provide oral care Other Recommendations: Have oral suction available   Follow up Recommendations 24 hour supervision/assistance      Frequency and Duration min 2x/week  1 week;2 weeks       Prognosis Prognosis for Safe Diet Advancement: Fair      Swallow Study   General Date of  Onset: 01/17/20 HPI: 86yo female admitted 01/17/20 with SOB, cough, BLE edema - suspect CHF exacerbation. PMH: severe aortic stenosis s/p TAVR (2015) breast cancer, HTN, HLD, hypothyroidism, GERD, OA, osteopenia, seizure, TIA (2007). CXR = no evidence of acute disease. Type of Study: Bedside Swallow Evaluation Previous Swallow Assessment: none Diet Prior to this Study: NPO Temperature Spikes Noted: No Respiratory Status: Nasal cannula History of Recent  Intubation: No Behavior/Cognition: Alert;Cooperative Oral Cavity Assessment: Dry Oral Care Completed by SLP: Yes Oral Cavity - Dentition: Adequate natural dentition Vision:  (kept eyes closed the whole evaluation) Self-Feeding Abilities: Total assist Patient Positioning: Upright in bed Baseline Vocal Quality: Low vocal intensity Volitional Cough: Congested;Weak Volitional Swallow: Able to elicit    Oral/Motor/Sensory Function Overall Oral Motor/Sensory Function: Generalized oral weakness   Ice Chips Ice chips: Within functional limits Presentation: Spoon   Thin Liquid Thin Liquid: Within functional limits Presentation: Straw    Nectar Thick Nectar Thick Liquid: Not tested   Honey Thick Honey Thick Liquid: Not tested   Puree Puree: Within functional limits Presentation: Spoon   Solid     Solid: Not tested     Imane Burrough B. Quentin Ore, [facility], [place] Speech Language Pathologist Office: [phone]  Shonna Chock 01/18/2020,4:04 PM

## 2020-01-18 NOTE — Progress Notes (Addendum)
°   01/18/20 1700  Assess: MEWS Score  Temp 99.4 F (37.4 C)  BP 124/69  Pulse Rate (!) 123  Resp (!) 38  SpO2 99 %  Assess: MEWS Score  MEWS Temp 0  MEWS Systolic 0  MEWS Pulse 2  MEWS RR 3  MEWS LOC 0  MEWS Score 5  MEWS Score Color Red  Assess: if the MEWS score is Yellow or Red  Were vital signs taken at a resting state? Yes  Focused Assessment Change from prior assessment (see assessment flowsheet)  Early Detection of Sepsis Score *See Row Information* Low  MEWS guidelines implemented *See Row Information* Yes  Treat  MEWS Interventions Escalated (See documentation below);Consulted Respiratory Therapy (contacted MD & Respiratory)  Take Vital Signs  Increase Vital Sign Frequency  Red: Q 1hr X 4 then Q 4hr X 4, if remains red, continue Q 4hrs

## 2020-01-19 DIAGNOSIS — I5023 Acute on chronic systolic (congestive) heart failure: Secondary | ICD-10-CM

## 2020-01-19 DIAGNOSIS — I5043 Acute on chronic combined systolic (congestive) and diastolic (congestive) heart failure: Secondary | ICD-10-CM | POA: Diagnosis not present

## 2020-01-19 DIAGNOSIS — G20 Parkinson's disease: Secondary | ICD-10-CM

## 2020-01-19 DIAGNOSIS — Z952 Presence of prosthetic heart valve: Secondary | ICD-10-CM | POA: Diagnosis not present

## 2020-01-19 DIAGNOSIS — Z515 Encounter for palliative care: Secondary | ICD-10-CM

## 2020-01-19 DIAGNOSIS — Z7189 Other specified counseling: Secondary | ICD-10-CM

## 2020-01-19 DIAGNOSIS — J9601 Acute respiratory failure with hypoxia: Secondary | ICD-10-CM | POA: Diagnosis not present

## 2020-01-19 LAB — BASIC METABOLIC PANEL
Anion gap: 12 (ref 5–15)
BUN: 27 mg/dL — ABNORMAL HIGH (ref 8–23)
CO2: 29 mmol/L (ref 22–32)
Calcium: 8.9 mg/dL (ref 8.9–10.3)
Chloride: 95 mmol/L — ABNORMAL LOW (ref 98–111)
Creatinine, Ser: 1.02 mg/dL — ABNORMAL HIGH (ref 0.44–1.00)
GFR calc Af Amer: 57 mL/min — ABNORMAL LOW (ref >60)
GFR calc non Af Amer: 49 mL/min — ABNORMAL LOW (ref >60)
Glucose, Bld: 107 mg/dL — ABNORMAL HIGH (ref 70–99)
Potassium: 3.4 mmol/L — ABNORMAL LOW (ref 3.5–5.1)
Sodium: 136 mmol/L (ref 135–145)

## 2020-01-19 LAB — PROCALCITONIN: Procalcitonin: 5.85 ng/mL

## 2020-01-19 LAB — ECHOCARDIOGRAM COMPLETE
AR max vel: 1.31 cm2
AV Area VTI: 1.33 cm2
AV Area mean vel: 1.16 cm2
AV Mean grad: 10 mmHg
AV Peak grad: 14.7 mmHg
Ao pk vel: 1.92 m/s
Area-P 1/2: 4.89 cm2
Height: 67 in
S' Lateral: 4.5 cm
Weight: 2709.01 oz

## 2020-01-19 LAB — CBC
HCT: 37.2 % (ref 36.0–46.0)
Hemoglobin: 12.3 g/dL (ref 12.0–15.0)
MCH: 31.3 pg (ref 26.0–34.0)
MCHC: 33.1 g/dL (ref 30.0–36.0)
MCV: 94.7 fL (ref 80.0–100.0)
Platelets: 225 10*3/uL (ref 150–400)
RBC: 3.93 MIL/uL (ref 3.87–5.11)
RDW: 13.5 % (ref 11.5–15.5)
WBC: 9.3 10*3/uL (ref 4.0–10.5)
nRBC: 0 % (ref 0.0–0.2)

## 2020-01-19 MED ORDER — LISINOPRIL 5 MG PO TABS
5.0000 mg | ORAL_TABLET | Freq: Every day | ORAL | Status: DC
  Administered 2020-01-20 – 2020-01-29 (×10): 5 mg via ORAL
  Filled 2020-01-19 (×11): qty 1

## 2020-01-19 MED ORDER — VANCOMYCIN HCL 1500 MG/300ML IV SOLN
1500.0000 mg | Freq: Once | INTRAVENOUS | Status: AC
  Administered 2020-01-19: 1500 mg via INTRAVENOUS
  Filled 2020-01-19 (×2): qty 300

## 2020-01-19 MED ORDER — LEVALBUTEROL HCL 0.63 MG/3ML IN NEBU
0.6300 mg | INHALATION_SOLUTION | Freq: Four times a day (QID) | RESPIRATORY_TRACT | Status: DC | PRN
  Administered 2020-01-19: 0.63 mg via RESPIRATORY_TRACT
  Filled 2020-01-19: qty 3

## 2020-01-19 MED ORDER — VANCOMYCIN HCL 750 MG/150ML IV SOLN
750.0000 mg | Freq: Two times a day (BID) | INTRAVENOUS | Status: DC
  Administered 2020-01-20 – 2020-01-21 (×3): 750 mg via INTRAVENOUS
  Filled 2020-01-19 (×4): qty 150

## 2020-01-19 MED ORDER — POTASSIUM CHLORIDE CRYS ER 20 MEQ PO TBCR
20.0000 meq | EXTENDED_RELEASE_TABLET | Freq: Three times a day (TID) | ORAL | Status: DC
  Administered 2020-01-19 – 2020-01-28 (×27): 20 meq via ORAL
  Filled 2020-01-19 (×27): qty 1

## 2020-01-19 MED ORDER — METOPROLOL SUCCINATE ER 25 MG PO TB24
25.0000 mg | ORAL_TABLET | Freq: Two times a day (BID) | ORAL | Status: DC
  Administered 2020-01-19 – 2020-01-20 (×2): 25 mg via ORAL
  Filled 2020-01-19 (×2): qty 1

## 2020-01-19 MED ORDER — METRONIDAZOLE IN NACL 5-0.79 MG/ML-% IV SOLN
500.0000 mg | Freq: Three times a day (TID) | INTRAVENOUS | Status: DC
  Administered 2020-01-19 – 2020-01-24 (×15): 500 mg via INTRAVENOUS
  Filled 2020-01-19 (×15): qty 100

## 2020-01-19 MED ORDER — FUROSEMIDE 40 MG PO TABS
40.0000 mg | ORAL_TABLET | Freq: Every day | ORAL | Status: DC
  Administered 2020-01-19 – 2020-01-30 (×12): 40 mg via ORAL
  Filled 2020-01-19 (×12): qty 1

## 2020-01-19 NOTE — Progress Notes (Signed)
[facility]                                                                              Patient Demographics  Betty Chan, is a 86 y.o. female, DOB - 1930/04/12, WIO:[id number]  Admit date - 01/17/2020   Admitting Physician Shela Leff, MD  Outpatient Primary MD for the patient is Maurice Small, MD  Outpatient specialists:   LOS - 2  days   Medical records reviewed and are as summarized below:    Chief Complaint  Patient presents with   Shortness of Breath       Brief summary   Patient is 86 year old female with history of severe aortic stenosis status post TAVR in 2015, breast CA status post lumpectomy, hypertension, hyperlipidemia, hypothyroidism, GERD presented with shortness of breath.  Patient reported 5-day history of dyspnea, worse with exertion.  Also coughing and ankles have been swollen.  Denies prior history of COPD or CHF. Chest x-ray showed pulmonary edema versus atypical infection CTA chest negative for PE, showed patchy atelectasis, nonspecific peribronchial thickening and cardiomegaly, BNP elevated 504.  Assessment & Plan    Principal Problem: Acute respiratory failure with hypoxia secondary to acute on chronic systolic and diastolic exacerbation of CHF (congestive heart failure) (HCC) --History of severe aortic stenosis status post TAVR, echo in 2016 ([facility]) showed EF 42%, grade 1 diastolic dysfunction -At the time of my examination, still coarse lung sounds, on NRB, elevated procalcitonin likely has some component of aspiration -Continue IV Lasix for diuresis, cardiology following -At the time of my examination on NRB, wean O2 as tolerated -2D echo showed EF of 25 to 30% with global hypokinesis, left ventricular severely decreased function  Aspiration pneumonia -Still having coarse breath sounds, likely has aspiration, SLP evaluation reviewed -Covid negative, however given multiple comorbidities, new respiratory failure with  hypoxia, age, high risk for mortality, palliative medicine consulted for goals of care. -Allergic to penicillin, hence add vancomycin and Flagyl   Elevated troponin -Mildly elevated troponin likely due to #1, follow 2D echo -Currently no chest pain, cardiology following  E. coli UTI -UA showed positive nitrite, leukocytes, many bacteria, > 50 WBC -Urine culture showed more than 100,000 colonies of E. coli, continue IV Rocephin  Hypothyroidism -Continue Synthroid, TSH 1.6, previously 4.79  Essential hypertension -Continue Lasix, resume metoprolol  History of aortic stenosis status post TAVR in 2015 -2D echo as above, cardiology following  Generalized debility, Parkinson's disease  -Very deconditioned, needs goals of care, PT evaluation - per daughter, wheel chair bound for last 4-5 years, otherwise cognitively intact, has a hard time vocalizing (whispering is chronic for the patient per daughter)    Pressure injury of skin -Right heel stage I, POA   Code Status: Full CODE STATUS DVT Prophylaxis:  Lovenox Family Communication: Discussed all imaging results, lab results, explained to the patient, son and daughter at the bedside   Disposition Plan:     Status is: Inpatient  Remains inpatient appropriate because:Inpatient level of care appropriate due to severity of illness   Dispo: The patient is from: SNF  Anticipated d/c is to: SNF              Anticipated d/c date is: > 3 days              Patient currently is not medically stable to d/c.   Time Spent in minutes   35  Procedures:  2D echo  Consultants:   Cardiology Palliative  Antimicrobials:   Anti-infectives (From admission, onward)   Start     Dose/Rate Route Frequency Ordered Stop   01/18/20 1200  cefTRIAXone (ROCEPHIN) 1 g in sodium chloride 0.9 % 100 mL IVPB     Discontinue     1 g 200 mL/hr over 30 Minutes Intravenous Every 24 hours 01/18/20 1010           Medications  Scheduled  Meds:  atorvastatin  20 mg Oral Daily   enoxaparin (LOVENOX) injection  40 mg Subcutaneous Q24H   furosemide  40 mg Oral Daily   guaiFENesin  600 mg Oral BID   levothyroxine  50 mcg Oral QAC breakfast   lisinopril  5 mg Oral Daily   mouth rinse  15 mL Mouth Rinse BID   metoprolol succinate  25 mg Oral BID   mirabegron ER  50 mg Oral Daily   pantoprazole  40 mg Oral Daily   potassium chloride  20 mEq Oral TID   sodium chloride flush  3 mL Intravenous Q12H   Continuous Infusions:  sodium chloride     cefTRIAXone (ROCEPHIN)  IV 1 g (01/19/20 1315)   PRN Meds:.sodium chloride, acetaminophen, hydrALAZINE, levalbuterol, sodium chloride flush      Subjective:   Norissa Bartee was seen and examined today. Still having coarse breath sounds throughout, no chest pain, fevers or chills. On NRB at the time of my examination. No fevers or chills. Family at the bedside  Objective:   Vitals:   01/18/20 1735 01/18/20 2208 01/19/20 0521 01/19/20 1234  BP: (!) 144/75 (!) 129/79 (!) 136/73 113/66  Pulse: 101 94 84 91  Resp: (!) 24 19 19 20   Temp: 99.5 F (37.5 C) 98.3 F (36.8 C) 98.1 F (36.7 C) 98.5 F (36.9 C)  TempSrc: Oral Oral  Oral  SpO2: 98% 91% 99% 92%  Weight:      Height:        Intake/Output Summary (Last 24 hours) at 01/19/2020 1443 Last data filed at 01/18/2020 2207 Gross per 24 hour  Intake 100 ml  Output 700 ml  Net -600 ml     Wt Readings from Last 3 Encounters:  01/18/20 76.8 kg  08/25/16 74.4 kg  08/18/16 76.2 kg   Physical Exam  General: Alert and oriented x 3, NAD  Cardiovascular: S1 S2 clear, RRR. 2+ pedal edema b/l  Respiratory: Coarse breath sounds bilaterally  Gastrointestinal: Soft, nontender, nondistended, NBS  Ext: 2+ pedal edema bilaterally  Neuro: no new deficits  Musculoskeletal: No cyanosis, clubbing  Skin: No rashes  Psych: Normal affect and demeanor, alert and oriented x3    Data Reviewed:  I have personally  reviewed following labs and imaging studies  Micro Results Recent Results (from the past 240 hour(s))  SARS Coronavirus 2 by RT PCR (hospital order, performed in [place] hospital lab) Nasopharyngeal Nasopharyngeal Swab     Status: None   Collection Time: 01/17/20  3:12 PM   Specimen: Nasopharyngeal Swab  Result Value Ref Range Status   SARS Coronavirus 2 NEGATIVE NEGATIVE Final    Comment: (NOTE) SARS-CoV-2 target  nucleic acids are NOT DETECTED.  The SARS-CoV-2 RNA is generally detectable in upper and lower respiratory specimens during the acute phase of infection. The lowest concentration of SARS-CoV-2 viral copies this assay can detect is 250 copies / mL. A negative result does not preclude SARS-CoV-2 infection and should not be used as the sole basis for treatment or other patient management decisions.  A negative result may occur with improper specimen collection / handling, submission of specimen other than nasopharyngeal swab, presence of viral mutation(s) within the areas targeted by this assay, and inadequate number of viral copies (<250 copies / mL). A negative result must be combined with clinical observations, patient history, and epidemiological information.  Fact Sheet for Patients:   StrictlyIdeas.no  Fact Sheet for Healthcare Providers: BankingDealers.co.za  This test is not yet approved or  cleared by the [place] FDA and has been authorized for detection and/or diagnosis of SARS-CoV-2 by FDA under an Emergency Use Authorization (EUA).  This EUA will remain in effect (meaning this test can be used) for the duration of the COVID-19 declaration under Section 564(b)(1) of the Act, 21 U.S.C. section 360bbb-3(b)(1), unless the authorization is terminated or revoked sooner.  Performed at [facility], [place] [address] [place], [place] [address]   Urine Culture     Status: Abnormal (Preliminary  result)   Collection Time: 01/17/20  9:30 PM   Specimen: Urine, Random  Result Value Ref Range Status   Specimen Description   Final    URINE, RANDOM Performed at [facility] [address] [place], [place] [address]    Special Requests   Final    NONE Performed at [facility], [place] [address] [place], [place] [address]    Culture (A)  Final    >=100,000 COLONIES/mL ESCHERICHIA COLI SUSCEPTIBILITIES TO FOLLOW Performed at [facility], [place] [address] [place], [place] [address]    Report Status PENDING  Incomplete  MRSA PCR Screening     Status: None   Collection Time: 01/17/20 11:33 PM   Specimen: Nasal Mucosa; Nasopharyngeal  Result Value Ref Range Status   MRSA by PCR NEGATIVE NEGATIVE Final    Comment:        The GeneXpert MRSA Assay (FDA approved for NASAL specimens only), is one component of a comprehensive MRSA colonization surveillance program. It is not intended to diagnose MRSA infection nor to guide or monitor treatment for MRSA infections. Performed at [facility], [place] [address] [place],  [address]     Radiology Reports DG Chest 2 View  Result Date: 01/17/2020 CLINICAL DATA:  Shortness of breath EXAM: CHEST - 2 VIEW COMPARISON:  None. FINDINGS: The cardiomediastinal silhouette is mildly enlarged in contour.Status post TAVR. Atherosclerotic calcifications the aorta. Tortuous thoracic aorta. Hiatal hernia. Elevation the RIGHT hemidiaphragm. Diffuse reticular prominence. Mild peribronchial cuffing. No pleural effusion. No pneumothorax. LEFT basilar linear opacities, likely atelectasis. Surgical clips of the RIGHT axilla. Visualized abdomen is unremarkable. Multilevel degenerative changes of the thoracic spine. IMPRESSION: 1. Constellation of findings are favored to reflect pulmonary edema scattered areas of atelectasis. Atypical infection could present similarly. Electronically Signed   By:  Valentino Saxon MD   On: 01/17/2020 15:26   CT Angio Chest PE W/Cm &/Or Wo Cm  Result Date: 01/17/2020 CLINICAL DATA:  Chest pain, shortness of breath EXAM: CT ANGIOGRAPHY CHEST WITH CONTRAST TECHNIQUE: Multidetector CT imaging of the chest was performed using the standard protocol during bolus  administration of intravenous contrast. Multiplanar CT image reconstructions and MIPs were obtained to evaluate the vascular anatomy. CONTRAST:  126mL OMNIPAQUE IOHEXOL 350 MG/ML SOLN COMPARISON:  None. FINDINGS: Cardiovascular: Satisfactory opacification of the pulmonary arteries to the lobar level and some proximal segmental arteries. Evaluation is more limited at the lung bases. No evidence of pulmonary embolism. Cardiomegaly. Aortic valve replacement. No pericardial effusion. Thoracic aorta calcification. Coronary artery calcification. Mediastinum/Nodes: Nonspecific mildly enlarged right suprahilar lymph node. Esophagus is unremarkable. Lungs/Pleura: Imaging is in expiration. Patchy atelectasis is present. Probable scarring at the lung bases. Nonspecific peribronchial thickening. No pleural effusion or pneumothorax. Upper Abdomen: No acute abnormality. Musculoskeletal: No acute osseous abnormality. No significant chest wall finding. Review of the MIP images confirms the above findings. IMPRESSION: No evidence of acute pulmonary embolism within the above limitation. Imaging in expiration with patchy atelectasis. Nonspecific peribronchial thickening. Cardiomegaly. Aortic Atherosclerosis (ICD10-I70.0). Electronically Signed   By: Macy Mis M.D.   On: 01/17/2020 18:15   DG CHEST PORT 1 VIEW  Result Date: 01/18/2020 CLINICAL DATA:  86 year old female with history of dyspnea. EXAM: PORTABLE CHEST 1 VIEW COMPARISON:  Chest x-ray 01/17/2020. FINDINGS: Lung volumes are low. Scattered areas of interstitial prominence, similar to prior examinations. No confluent consolidative airspace disease. No pleural effusions.  No evidence of pulmonary edema. No pneumothorax. Heart size is normal. The patient is rotated to the left on today's exam, resulting in distortion of the mediastinal contours and reduced diagnostic sensitivity and specificity for mediastinal pathology. Aortic atherosclerosis. Status post TAVR with Edwards Sapien valve projecting over the aortic root. Surgical clips project over the right axillary region from prior lymph node dissection. IMPRESSION: 1. Low lung volumes without radiographic evidence of acute cardiopulmonary disease. 2. Aortic atherosclerosis. 3. Postoperative changes, as above. Electronically Signed   By: Vinnie Langton M.D.   On: 01/18/2020 11:04   ECHOCARDIOGRAM COMPLETE  Result Date: 01/19/2020    ECHOCARDIOGRAM REPORT   Patient Name:   Betty Chan Date of Exam: 01/18/2020 Medical Rec #:  [id number]        Height:       67.0 in Accession #:    [id number]       Weight:       169.3 lb Date of Birth:  April 30, 1930       BSA:          1.884 m Patient Age:    82 years         BP:           123/50 mmHg Patient Gender: F                HR:           128 bpm. Exam Location:  Inpatient Procedure: 2D Echo, Cardiac Doppler and Color Doppler Indications:    I50.23 Acute on chronic systolic (congestive) heart failure  History:        Patient has no prior history of Echocardiogram examinations.                 TIA, Aortic Valve Disease, Signs/Symptoms:Murmur; Risk                 Factors:Hypertension and Dyslipidemia. Hypothyroidism. Seizures.                 Cancer. GERD.                 Aortic Valve: 29 mm Edwards Sapien prosthetic, stented (TAVR)  valve is present in the aortic position. Procedure Date: March                 2015.  Sonographer:    Jonelle Sidle Dance Referring Phys: [id number] [place]  1. Patient was tachycardic in the 120s throughout the study.  2. Compared with the echo report 01/1274, systolic function is worse. Left ventricular ejection fraction, by  estimation, is 25 to 30%. The left ventricle has severely decreased function. The left ventricle demonstrates global hypokinesis. There is mild concentric left ventricular hypertrophy. Left ventricular diastolic parameters are indeterminate.  3. Right ventricular systolic function is normal. The right ventricular size is normal.  4. Left atrial size was mildly dilated.  5. The mitral valve is normal in structure. Trivial mitral valve regurgitation. No evidence of mitral stenosis.  6. The aortic valve has been repaired/replaced. Aortic valve regurgitation is not visualized. No aortic stenosis is present. There is a 29 mm Edwards Sapien prosthetic (TAVR) valve present in the aortic position. Procedure Date: March 2015. Echo findings are consistent with normal structure and function of the aortic valve prosthesis. Aortic valve area, by VTI measures 1.33 cm. Aortic valve mean gradient measures 10.0 mmHg. Aortic valve Vmax measures 1.92 m/s.  7. Aortic dilatation noted. There is mild dilatation of the ascending aorta measuring 37 mm.  8. The inferior vena cava is normal in size with greater than 50% respiratory variability, suggesting right atrial pressure of 3 mmHg. FINDINGS  Left Ventricle: Compared with the echo report 06/6999, systolic function is worse. Left ventricular ejection fraction, by estimation, is 25 to 30%. The left ventricle has severely decreased function. The left ventricle demonstrates global hypokinesis. The left ventricular internal cavity size was normal in size. There is mild concentric left ventricular hypertrophy. Left ventricular diastolic parameters are indeterminate. Right Ventricle: The right ventricular size is normal. No increase in right ventricular wall thickness. Right ventricular systolic function is normal. Left Atrium: Left atrial size was mildly dilated. Right Atrium: Right atrial size was normal in size. Pericardium: There is no evidence of pericardial effusion. Mitral Valve: Mitral  valve leaflet tips appear to be attached as though she had a Mitraclip or Alfieri stitch. The mitral valve is normal in structure. Normal mobility of the mitral valve leaflets. Mild mitral annular calcification. Trivial mitral valve regurgitation. No evidence of mitral valve stenosis. Tricuspid Valve: The tricuspid valve is normal in structure. Tricuspid valve regurgitation is not demonstrated. No evidence of tricuspid stenosis. Aortic Valve: The aortic valve has been repaired/replaced. Aortic valve regurgitation is not visualized. No aortic stenosis is present. Aortic valve mean gradient measures 10.0 mmHg. Aortic valve peak gradient measures 14.7 mmHg. Aortic valve area, by VTI measures 1.33 cm. There is a 29 mm Edwards Sapien prosthetic, stented (TAVR) valve present in the aortic position. Procedure Date: March 2015. Echo findings are consistent with normal structure and function of the aortic valve prosthesis. Pulmonic Valve: The pulmonic valve was normal in structure. Pulmonic valve regurgitation is not visualized. No evidence of pulmonic stenosis. Aorta: Aortic dilatation noted. There is mild dilatation of the ascending aorta measuring 37 mm. Venous: The inferior vena cava is normal in size with greater than 50% respiratory variability, suggesting right atrial pressure of 3 mmHg. IAS/Shunts: No atrial level shunt detected by color flow Doppler.  LEFT VENTRICLE PLAX 2D LVIDd:         5.10 cm LVIDs:         4.50 cm LV PW:  1.10 cm LV IVS:        1.30 cm LVOT diam:     1.90 cm LV SV:         41 LV SV Index:   22 LVOT Area:     2.84 cm  RIGHT VENTRICLE          IVC RV Basal diam:  2.50 cm  IVC diam: 1.50 cm TAPSE (M-mode): 1.2 cm LEFT ATRIUM             Index       RIGHT ATRIUM           Index LA diam:        4.60 cm 2.44 cm/m  RA Area:     12.20 cm LA Vol (A2C):   73.4 ml 38.96 ml/m RA Volume:   25.10 ml  13.32 ml/m LA Vol (A4C):   36.9 ml 19.59 ml/m LA Biplane Vol: 53.9 ml 28.61 ml/m  AORTIC VALVE  AV Area (Vmax):    1.31 cm AV Area (Vmean):   1.16 cm AV Area (VTI):     1.33 cm AV Vmax:           192.00 cm/s AV Vmean:          147.000 cm/s AV VTI:            0.308 m AV Peak Grad:      14.7 mmHg AV Mean Grad:      10.0 mmHg LVOT Vmax:         88.65 cm/s LVOT Vmean:        60.050 cm/s LVOT VTI:          0.144 m LVOT/AV VTI ratio: 0.47  AORTA Ao Root diam: 3.30 cm Ao Asc diam:  3.70 cm MITRAL VALVE MV Area (PHT): 4.89 cm     SHUNTS MV Decel Time: 155 msec     Systemic VTI:  0.14 m MV E velocity: 112.50 cm/s  Systemic Diam: 1.90 cm Skeet Latch MD Electronically signed by Skeet Latch MD Signature Date/Time: 01/19/2020/6:30:29 AM    Final     Lab Data:  CBC: Recent Labs  Lab 01/17/20 1500 01/19/20 0421  WBC 5.3 9.3  NEUTROABS 3.8  --   HGB 12.1 12.3  HCT 38.3 37.2  MCV 99.5 94.7  PLT 215 361   Basic Metabolic Panel: Recent Labs  Lab 01/17/20 1500 01/18/20 0429 01/19/20 0421  NA 136 135 136  K 4.2 3.9 3.4*  CL 101 98 95*  CO2 27 28 29   GLUCOSE 82 116* 107*  BUN 29* 22 27*  CREATININE 0.80 0.87 1.02*  CALCIUM 8.8* 8.6* 8.9  MG  --  1.8  --    GFR: Estimated Creatinine Clearance: 41.5 mL/min (A) (by C-G formula based on SCr of 1.02 mg/dL (H)). Liver Function Tests: Recent Labs  Lab 01/17/20 1500  AST 28  ALT 26  ALKPHOS 56  BILITOT 0.7  PROT 7.6  ALBUMIN 3.9   No results for input(s): LIPASE, AMYLASE in the last 168 hours. No results for input(s): AMMONIA in the last 168 hours. Coagulation Profile: No results for input(s): INR, PROTIME in the last 168 hours. Cardiac Enzymes: No results for input(s): CKTOTAL, CKMB, CKMBINDEX, TROPONINI in the last 168 hours. BNP (last 3 results) No results for input(s): PROBNP in the last 8760 hours. HbA1C: No results for input(s): HGBA1C in the last 72 hours. CBG: No results for input(s): GLUCAP in the last 168 hours. Lipid  Profile: No results for input(s): CHOL, HDL, LDLCALC, TRIG, CHOLHDL, LDLDIRECT in the last  72 hours. Thyroid Function Tests: Recent Labs    01/18/20 0429 01/18/20 0930  TSH 1.600  --   FREET4  --  0.79   Anemia Panel: No results for input(s): VITAMINB12, FOLATE, FERRITIN, TIBC, IRON, RETICCTPCT in the last 72 hours. Urine analysis:    Component Value Date/Time   COLORURINE YELLOW 01/17/2020 2130   APPEARANCEUR HAZY (A) 01/17/2020 2130   LABSPEC 1.010 01/17/2020 2130   PHURINE 5.5 01/17/2020 2130   GLUCOSEU NEGATIVE 01/17/2020 2130   HGBUR TRACE (A) 01/17/2020 2130   BILIRUBINUR NEGATIVE 01/17/2020 2130   KETONESUR NEGATIVE 01/17/2020 2130   PROTEINUR NEGATIVE 01/17/2020 2130   UROBILINOGEN 1.0 11/25/2014 0220   NITRITE POSITIVE (A) 01/17/2020 2130   LEUKOCYTESUR SMALL (A) 01/17/2020 2130     Holly Iannaccone M.D. [facility] 01/19/2020, 2:43 PM   Call night coverage person covering after 7pm

## 2020-01-19 NOTE — Progress Notes (Signed)
  Speech Language Pathology Treatment:    Patient Details Name: Betty Chan MRN: [id number] DOB: 1930/03/19 Today's Date: 01/19/2020 Time: 5790-3833 SLP Time Calculation (min) (ACUTE ONLY): 30 min  Assessment / Plan / Recommendation Clinical Impression  Pt seen for dysphagia treatment, family education, readiness for instrumental eval and/or po.  Use of ice chips to faciltate oral care and pharyngeal clearance of retained secretions.  Pt able to cough and expectorate x2 during session but this is effortful for pt.  She appeared fatigue after intake and did not sound as if fully cleared secretions.  Advised family that given pt's baseline mild dysphagia, Parkinsonism vs PSP and current respiratory condition, her aspiration risk and asp pna is high. Family and pt desire for pt to have repeat MBS when indicated but SLP did advise even if swallow function is not severely impaired, her gross deconditioning, poor cough mechanics make her a chronic risk.  Will follow up for readiness for MBS when pt's secretions have cleared with improved respiratory status.    HPI HPI: 86yo female admitted 01/17/20 with SOB, cough, BLE edema - suspect CHF exacerbation. PMH: severe aortic stenosis s/p TAVR (2015) breast cancer, HTN, HLD, hypothyroidism, GERD, OA, osteopenia, seizure, TIA (2007). CXR = no evidence of acute disease.  Pt has h/o PSP vs Parkinsonism gleaned from prior MBS 2019 report.  Her daughter reports pt's voice and cough has been weak chronically.  Pt has good appetite PTA and does not cough with intake per family.  Daughter also reports pt has a decayed tooth that was diagnosed aprox 3 weeks ago - plan is for extraction in the future.      SLP Plan  Continue with current plan of care       Recommendations  Diet recommendations: NPO (single ice chips) Medication Administration: Via alternative means                Oral Care Recommendations: Oral care prior to ice chip/H20;Oral care  QID;Staff/trained caregiver to provide oral care Follow up Recommendations: 24 hour supervision/assistance SLP Visit Diagnosis: Dysphagia, unspecified (R13.10) Plan: Continue with current plan of care       GO                Macario Golds 01/19/2020, 10:35 AM

## 2020-01-19 NOTE — Consult Note (Signed)
Consultation Note Date: 01/19/2020   Patient Name: Betty Chan  DOB: 01-31-1930  MRN: [id number]  Age / Sex: 86 y.o., female  PCP: Maurice Small, MD Referring Physician: Mendel Corning, MD  Reason for Consultation: Establishing goals of care  HPI/Patient Profile: 86 y.o. female  with past medical history of severe aortic stenosis s/p TAVR 2015, breast cancer s/p lumpectomy, hypertension, hyperlipidemia, hypothyroidism, GERD admitted on 01/17/2020 with shortness of breath with cough and swollen ankles due to acute heart failure exacerbation EF 25-30% with global hypokinesis of left ventricle and severely decreased function. Also being treated for UTI. Noted to have Parkinson's disease with declining functional status and wheelchair bound for past 4-5 years. Also with right heel pressure ulcer stage 1.   Clinical Assessment and Goals of Care: I met today with Ms. Gohlke son and daughter, Ronalee Belts and [name]. We spoke privately regarding Ms. Krider goals of care. They provided good insight into their mother's history and wishes. She lives in a retirement community and has aides and is happy and well cared for with this arrangement. She loves to play Bingo. She has minimal expectations for quality of life and is very happy with her current arrangement. They explain that both their parents have always had the mentality to pursue all measures to prolong life. [name] becomes tearful explaining that her mother does NOT want a DNR and has stated this emphatically and [name] supports her wishes. On the other hand they both express that they do not want her to suffer.   We did spend some time discussing the specific concerns and expectations and trajectory with advanced heart failure along with already poor functional status with Parkinsonian syndrome. I explained that these two progressive diseases are expected to cause more  complications and overall decline. This does not mean that she will not have any improvement and is currently at end of life but she is likely to have ongoing struggles and faced with difficult decisions. I encouraged that it is more helpful to begin thinking, discussing, preparing for these scenarios and situations now so they have a plan and are not trying to make a major decision during an emotional time. I explained that this is exactly how palliative care helps is to try and prepare and anticipate issues and decisions that they will be faced with and learn more about the priorities of their mother's quality of life and care.   We also discussed issue of NPO status and unable to eat/drink. We discussed SLP role in making recommendations based on risks vs benefits of nutrition vs aspiration. Explained that hopefully we will be able to trial even a modified diet in the near future. We did also discuss if this is not safe the only other option to provide her nutrition to keep her alive would be from a feeding tube. They are not sure if this is something that she would want. I encouraged further conversation as a family. I provided Hard Choices booklet to [place]. I will follow up tomorrow. If continues  to be high risk for aspiration I will discuss more specific details regarding artificial feeding benefits vs consequences/risks.   All questions/concerns addressed to the best of my ability. Emotional support provided.   Primary Decision Maker  Patient with support of her children.     SUMMARY OF RECOMMENDATIONS   - Ongoing [place] conversations.   Code Status/Advance Care Planning:  Full code   Symptom Management:   Per attending, cardiology, SLP.   Palliative Prophylaxis:   Aspiration, Delirium Protocol, Frequent Pain Assessment, Oral Care and Turn Reposition  Additional Recommendations (Limitations, Scope, Preferences):  Full Scope Treatment  Psycho-social/Spiritual:   Desire for further  Chaplaincy support:no  Additional Recommendations: Caregiving  Support/Resources  Prognosis:   Overall prognosis guarded with advanced heart failure along with underlying Parkinsonian syndrome and poor functional status.   Discharge Planning: To Be Determined      Primary Diagnoses: Present on Admission: . Essential hypertension . Hyperlipidemia   I have reviewed the medical record, interviewed the patient and family, and examined the patient. The following aspects are pertinent.  Past Medical History:  Diagnosis Date  . Aortic stenosis    s/p TAVR 08/2013 -follows at [facility])  . Basal cell carcinoma   . Cancer of right breast [facility]) 2004   lumpectomy  . Dyslipidemia   . GERD (gastroesophageal reflux disease)   . Heart murmur   . Hypertension   . Hypothyroid   . OA (osteoarthritis) of knee    "right" (11/24/2014)  . Osteopenia 02/13/2014   DEXA 01/2014: -2.2  . PHN (postherpetic neuralgia)    L anterior thigh 2007, recurrent 04/2013  . Seizure ([place]) 2007   single event, off Keppra since 11/2013; "not sure if it was a TIA or seizure"  . Squamous cell cancer of skin of forearm   . TIA (transient ischemic attack) 2007   "not sure if it was a TIA or seizure"   Social History   Socioeconomic History  . Marital status: Widowed    Spouse name: Not on file  . Number of children: Not on file  . Years of education: Not on file  . Highest education level: Not on file  Occupational History  . Occupation: retired    Comment: Network engineer  Tobacco Use  . Smoking status: Never Smoker  . Smokeless tobacco: Never Used  . Tobacco comment: independent living with assistance  Substance and Sexual Activity  . Alcohol use: No  . Drug use: No  . Sexual activity: Never  Other Topics Concern  . Not on file  Social History Narrative  . Not on file   Social Determinants of Health   Financial Resource Strain:   . Difficulty of Paying Living Expenses:   Food Insecurity:   .  Worried About Charity fundraiser in the Last Year:   . Arboriculturist in the Last Year:   Transportation Needs:   . Film/video editor (Medical):   Marland Kitchen Lack of Transportation (Non-Medical):   Physical Activity:   . Days of Exercise per Week:   . Minutes of Exercise per Session:   Stress:   . Feeling of Stress :   Social Connections:   . Frequency of Communication with Friends and Family:   . Frequency of Social Gatherings with Friends and Family:   . Attends Religious Services:   . Active Member of Clubs or Organizations:   . Attends Archivist Meetings:   Marland Kitchen Marital Status:  Family History  Problem Relation Age of Onset  . Prostate cancer Father   . Breast cancer Sister   . Stroke Mother 100  . Dementia Sister    Scheduled Meds: . atorvastatin  20 mg Oral Daily  . enoxaparin (LOVENOX) injection  40 mg Subcutaneous Q24H  . furosemide  40 mg Intravenous BID  . guaiFENesin  600 mg Oral BID  . levothyroxine  50 mcg Oral QAC breakfast  . mouth rinse  15 mL Mouth Rinse BID  . metoprolol succinate  25 mg Oral Daily  . mirabegron ER  50 mg Oral Daily  . pantoprazole  40 mg Oral Daily  . potassium chloride  20 mEq Oral BID  . sodium chloride flush  3 mL Intravenous Q12H   Continuous Infusions: . sodium chloride    . cefTRIAXone (ROCEPHIN)  IV Stopped (01/18/20 2209)   PRN Meds:.sodium chloride, acetaminophen, hydrALAZINE, sodium chloride flush Allergies  Allergen Reactions  . Amoxicillin Rash  . Penicillins Rash   Review of Systems  Physical Exam Constitutional:      General: She is not in acute distress.    Comments: Frail, elderly  Cardiovascular:     Rate and Rhythm: Normal rate.  Pulmonary:     Effort: No tachypnea, accessory muscle usage or respiratory distress.  Abdominal:     General: Abdomen is flat.  Neurological:     Mental Status: She is alert and oriented to person, place, and time.     Vital Signs: BP (!) 136/73 (BP Location: Left  Arm)   Pulse 84   Temp 98.1 F (36.7 C)   Resp 19   Ht 5' 7"  (1.702 m)   Wt 76.8 kg   SpO2 99%   BMI 26.52 kg/m  Pain Scale: Faces   Pain Score: Asleep   SpO2: SpO2: 99 % O2 Device:SpO2: 99 % O2 Flow Rate: .O2 Flow Rate (L/min): 15 L/min  IO: Intake/output summary:   Intake/Output Summary (Last 24 hours) at 01/19/2020 1033 Last data filed at 01/18/2020 2207 Gross per 24 hour  Intake 800 ml  Output 1700 ml  Net -900 ml    LBM: Last BM Date: 01/17/20 Baseline Weight: Weight: 78 kg Most recent weight: Weight: 76.8 kg     Palliative Assessment/Data:     Time In: 1450 Time Out: 1540 Time Total: 50 min Greater than 50%  of this time was spent counseling and coordinating care related to the above assessment and plan.  Signed by: Vinie Sill, NP Palliative Medicine Team Pager # [phone] (M-F 8a-5p) Team Phone # [phone] (Nights/Weekends)

## 2020-01-19 NOTE — Progress Notes (Signed)
1906 1500mg  Vancomycin was spilled on the floor, due to unconnected IVPB tube. Pharmacy contacted for replacement bag. Order marked as not given, will rescan under original order once medication is available.

## 2020-01-19 NOTE — Progress Notes (Signed)
Pharmacy Antibiotic Note  Betty Chan is a 86 y.o. female with hx HF, presented to the ED on 01/17/2020 with SOB.  Chest CTA on 7/27 was negative for PE.  She was started on ceftriaxone on 7/28 for UTI.  Pharmacy is consulted on 7/29 to add vancomycin to abx regimen for suspected aspiration PNA.   Plan: - vancomycin 1500 mg IV x1, then 750 mg IV q12h - continue ceftriaxone 1gm IV q24h and flagyl 500 mg IV q8h per MD _____________________________  Height: 5\' 7"  (170.2 cm) Weight: 76.8 kg (169 lb 5 oz) IBW/kg (Calculated) : 61.6  Temp (24hrs), Avg:98.8 F (37.1 C), Min:98.1 F (36.7 C), Max:99.5 F (37.5 C)  Recent Labs  Lab 01/17/20 1500 01/18/20 0429 01/19/20 0421  WBC 5.3  --  9.3  CREATININE 0.80 0.87 1.02*    Estimated Creatinine Clearance: 41.5 mL/min (A) (by C-G formula based on SCr of 1.02 mg/dL (H)).    Allergies  Allergen Reactions  . Amoxicillin Rash  . Penicillins Rash   Antimicrobials this admission:  7/29 vanc>> 7/28 CTX>> 7/29 Flagyl>>   Microbiology results:  7/27 UCx: >100K ecoli 7/27 MRSA PCR: neg   Thank you for allowing pharmacy to be a part of this patient's care.  Lynelle Doctor 01/19/2020 3:01 PM

## 2020-01-19 NOTE — Progress Notes (Signed)
Progress Note  Patient Name: Betty Chan Date of Encounter: 01/19/2020  [facility] Cardiologist: No primary care provider on file. [name], MD ([facility])  Subjective   Remarkably better today. Alert, smiling. Denies dyspnea.  Inpatient Medications    Scheduled Meds: . atorvastatin  20 mg Oral Daily  . enoxaparin (LOVENOX) injection  40 mg Subcutaneous Q24H  . furosemide  40 mg Intravenous BID  . furosemide  40 mg Oral Daily  . guaiFENesin  600 mg Oral BID  . levothyroxine  50 mcg Oral QAC breakfast  . lisinopril  5 mg Oral Daily  . mouth rinse  15 mL Mouth Rinse BID  . metoprolol succinate  25 mg Oral BID  . mirabegron ER  50 mg Oral Daily  . pantoprazole  40 mg Oral Daily  . potassium chloride  20 mEq Oral BID  . sodium chloride flush  3 mL Intravenous Q12H   Continuous Infusions: . sodium chloride    . cefTRIAXone (ROCEPHIN)  IV Stopped (01/18/20 2209)   PRN Meds: sodium chloride, acetaminophen, hydrALAZINE, sodium chloride flush   Vital Signs    Vitals:   01/18/20 1700 01/18/20 1735 01/18/20 2208 01/19/20 0521  BP: 124/69 (!) 144/75 (!) 129/79 (!) 136/73  Pulse: (!) 123 101 94 84  Resp: (!) 38 (!) 24 19 19   Temp: 99.4 F (37.4 C) 99.5 F (37.5 C) 98.3 F (36.8 C) 98.1 F (36.7 C)  TempSrc: Oral Oral Oral   SpO2: 99% 98% 91% 99%  Weight:      Height:        Intake/Output Summary (Last 24 hours) at 01/19/2020 1209 Last data filed at 01/18/2020 2207 Gross per 24 hour  Intake 800 ml  Output 1700 ml  Net -900 ml   Last 3 Weights 01/18/2020 01/17/2020 05/18/2017  Weight (lbs) 169 lb 5 oz 171 lb 15.3 oz (No Data)  Weight (kg) 76.8 kg 78 kg (No Data)      Telemetry    NSR with very frequent monomorphic PVCs, occasional couplets, periods of bigeminy - Personally Reviewed  ECG    No new tracing - Personally Reviewed  Physical Exam  Alert, smiling GEN: No acute distress.   Neck: 6 cm JVD Cardiac: RRR w frequent ectopy, faint Ao ejection  murmur, no diastolic murmurs, rubs, or gallops.  Respiratory: Clear to auscultation bilaterally. GI: Soft, nontender, non-distended  MS: No edema; No deformity. Neuro:  Nonfocal  Psych: Normal affect   Labs    High Sensitivity Troponin:   Recent Labs  Lab 01/17/20 1500 01/17/20 1719  TROPONINIHS 18* 20*      Chemistry Recent Labs  Lab 01/17/20 1500 01/18/20 0429 01/19/20 0421  NA 136 135 136  K 4.2 3.9 3.4*  CL 101 98 95*  CO2 27 28 29   GLUCOSE 82 116* 107*  BUN 29* 22 27*  CREATININE 0.80 0.87 1.02*  CALCIUM 8.8* 8.6* 8.9  PROT 7.6  --   --   ALBUMIN 3.9  --   --   AST 28  --   --   ALT 26  --   --   ALKPHOS 56  --   --   BILITOT 0.7  --   --   GFRNONAA >60 60* 49*  GFRAA >60 >60 57*  ANIONGAP 8 9 12      Hematology Recent Labs  Lab 01/17/20 1500 01/19/20 0421  WBC 5.3 9.3  RBC 3.85* 3.93  HGB 12.1 12.3  HCT 38.3 37.2  MCV 99.5 94.7  MCH 31.4 31.3  MCHC 31.6 33.1  RDW 13.8 13.5  PLT 215 225    BNP Recent Labs  Lab 01/17/20 1547  BNP 504.5*     DDimer No results for input(s): DDIMER in the last 168 hours.   Radiology    DG Chest 2 View  Result Date: 01/17/2020 CLINICAL DATA:  Shortness of breath EXAM: CHEST - 2 VIEW COMPARISON:  None. FINDINGS: The cardiomediastinal silhouette is mildly enlarged in contour.Status post TAVR. Atherosclerotic calcifications the aorta. Tortuous thoracic aorta. Hiatal hernia. Elevation the RIGHT hemidiaphragm. Diffuse reticular prominence. Mild peribronchial cuffing. No pleural effusion. No pneumothorax. LEFT basilar linear opacities, likely atelectasis. Surgical clips of the RIGHT axilla. Visualized abdomen is unremarkable. Multilevel degenerative changes of the thoracic spine. IMPRESSION: 1. Constellation of findings are favored to reflect pulmonary edema scattered areas of atelectasis. Atypical infection could present similarly. Electronically Signed   By: Valentino Saxon MD   On: 01/17/2020 15:26   CT Angio  Chest PE W/Cm &/Or Wo Cm  Result Date: 01/17/2020 CLINICAL DATA:  Chest pain, shortness of breath EXAM: CT ANGIOGRAPHY CHEST WITH CONTRAST TECHNIQUE: Multidetector CT imaging of the chest was performed using the standard protocol during bolus administration of intravenous contrast. Multiplanar CT image reconstructions and MIPs were obtained to evaluate the vascular anatomy. CONTRAST:  128mL OMNIPAQUE IOHEXOL 350 MG/ML SOLN COMPARISON:  None. FINDINGS: Cardiovascular: Satisfactory opacification of the pulmonary arteries to the lobar level and some proximal segmental arteries. Evaluation is more limited at the lung bases. No evidence of pulmonary embolism. Cardiomegaly. Aortic valve replacement. No pericardial effusion. Thoracic aorta calcification. Coronary artery calcification. Mediastinum/Nodes: Nonspecific mildly enlarged right suprahilar lymph node. Esophagus is unremarkable. Lungs/Pleura: Imaging is in expiration. Patchy atelectasis is present. Probable scarring at the lung bases. Nonspecific peribronchial thickening. No pleural effusion or pneumothorax. Upper Abdomen: No acute abnormality. Musculoskeletal: No acute osseous abnormality. No significant chest wall finding. Review of the MIP images confirms the above findings. IMPRESSION: No evidence of acute pulmonary embolism within the above limitation. Imaging in expiration with patchy atelectasis. Nonspecific peribronchial thickening. Cardiomegaly. Aortic Atherosclerosis (ICD10-I70.0). Electronically Signed   By: Macy Mis M.D.   On: 01/17/2020 18:15   DG CHEST PORT 1 VIEW  Result Date: 01/18/2020 CLINICAL DATA:  86 year old female with history of dyspnea. EXAM: PORTABLE CHEST 1 VIEW COMPARISON:  Chest x-ray 01/17/2020. FINDINGS: Lung volumes are low. Scattered areas of interstitial prominence, similar to prior examinations. No confluent consolidative airspace disease. No pleural effusions. No evidence of pulmonary edema. No pneumothorax. Heart size  is normal. The patient is rotated to the left on today's exam, resulting in distortion of the mediastinal contours and reduced diagnostic sensitivity and specificity for mediastinal pathology. Aortic atherosclerosis. Status post TAVR with Edwards Sapien valve projecting over the aortic root. Surgical clips project over the right axillary region from prior lymph node dissection. IMPRESSION: 1. Low lung volumes without radiographic evidence of acute cardiopulmonary disease. 2. Aortic atherosclerosis. 3. Postoperative changes, as above. Electronically Signed   By: Vinnie Langton M.D.   On: 01/18/2020 11:04   ECHOCARDIOGRAM COMPLETE  Result Date: 01/19/2020    ECHOCARDIOGRAM REPORT   Patient Name:   AUTYM SIESS Date of Exam: 01/18/2020 Medical Rec #:  [id number]        Height:       67.0 in Accession #:    [id number]       Weight:       169.3 lb Date of Birth:  08/20/1929       BSA:          1.884 m Patient Age:    64 years         BP:           123/50 mmHg Patient Gender: F                HR:           128 bpm. Exam Location:  Inpatient Procedure: 2D Echo, Cardiac Doppler and Color Doppler Indications:    I50.23 Acute on chronic systolic (congestive) heart failure  History:        Patient has no prior history of Echocardiogram examinations.                 TIA, Aortic Valve Disease, Signs/Symptoms:Murmur; Risk                 Factors:Hypertension and Dyslipidemia. Hypothyroidism. Seizures.                 Cancer. GERD.                 Aortic Valve: 29 mm Edwards Sapien prosthetic, stented (TAVR)                 valve is present in the aortic position. Procedure Date: March                 2015.  Sonographer:    Jonelle Sidle Dance Referring Phys: [id number] [place]  1. Patient was tachycardic in the 120s throughout the study.  2. Compared with the echo report 07/4233, systolic function is worse. Left ventricular ejection fraction, by estimation, is 25 to 30%. The left ventricle has severely  decreased function. The left ventricle demonstrates global hypokinesis. There is mild concentric left ventricular hypertrophy. Left ventricular diastolic parameters are indeterminate.  3. Right ventricular systolic function is normal. The right ventricular size is normal.  4. Left atrial size was mildly dilated.  5. The mitral valve is normal in structure. Trivial mitral valve regurgitation. No evidence of mitral stenosis.  6. The aortic valve has been repaired/replaced. Aortic valve regurgitation is not visualized. No aortic stenosis is present. There is a 29 mm Edwards Sapien prosthetic (TAVR) valve present in the aortic position. Procedure Date: March 2015. Echo findings are consistent with normal structure and function of the aortic valve prosthesis. Aortic valve area, by VTI measures 1.33 cm. Aortic valve mean gradient measures 10.0 mmHg. Aortic valve Vmax measures 1.92 m/s.  7. Aortic dilatation noted. There is mild dilatation of the ascending aorta measuring 37 mm.  8. The inferior vena cava is normal in size with greater than 50% respiratory variability, suggesting right atrial pressure of 3 mmHg. FINDINGS  Left Ventricle: Compared with the echo report 08/6142, systolic function is worse. Left ventricular ejection fraction, by estimation, is 25 to 30%. The left ventricle has severely decreased function. The left ventricle demonstrates global hypokinesis. The left ventricular internal cavity size was normal in size. There is mild concentric left ventricular hypertrophy. Left ventricular diastolic parameters are indeterminate. Right Ventricle: The right ventricular size is normal. No increase in right ventricular wall thickness. Right ventricular systolic function is normal. Left Atrium: Left atrial size was mildly dilated. Right Atrium: Right atrial size was normal in size. Pericardium: There is no evidence of pericardial effusion. Mitral Valve: Mitral valve leaflet tips appear to be attached as though she  had a Mitraclip or Alfieri stitch. The  mitral valve is normal in structure. Normal mobility of the mitral valve leaflets. Mild mitral annular calcification. Trivial mitral valve regurgitation. No evidence of mitral valve stenosis. Tricuspid Valve: The tricuspid valve is normal in structure. Tricuspid valve regurgitation is not demonstrated. No evidence of tricuspid stenosis. Aortic Valve: The aortic valve has been repaired/replaced. Aortic valve regurgitation is not visualized. No aortic stenosis is present. Aortic valve mean gradient measures 10.0 mmHg. Aortic valve peak gradient measures 14.7 mmHg. Aortic valve area, by VTI measures 1.33 cm. There is a 29 mm Edwards Sapien prosthetic, stented (TAVR) valve present in the aortic position. Procedure Date: March 2015. Echo findings are consistent with normal structure and function of the aortic valve prosthesis. Pulmonic Valve: The pulmonic valve was normal in structure. Pulmonic valve regurgitation is not visualized. No evidence of pulmonic stenosis. Aorta: Aortic dilatation noted. There is mild dilatation of the ascending aorta measuring 37 mm. Venous: The inferior vena cava is normal in size with greater than 50% respiratory variability, suggesting right atrial pressure of 3 mmHg. IAS/Shunts: No atrial level shunt detected by color flow Doppler.  LEFT VENTRICLE PLAX 2D LVIDd:         5.10 cm LVIDs:         4.50 cm LV PW:         1.10 cm LV IVS:        1.30 cm LVOT diam:     1.90 cm LV SV:         41 LV SV Index:   22 LVOT Area:     2.84 cm  RIGHT VENTRICLE          IVC RV Basal diam:  2.50 cm  IVC diam: 1.50 cm TAPSE (M-mode): 1.2 cm LEFT ATRIUM             Index       RIGHT ATRIUM           Index LA diam:        4.60 cm 2.44 cm/m  RA Area:     12.20 cm LA Vol (A2C):   73.4 ml 38.96 ml/m RA Volume:   25.10 ml  13.32 ml/m LA Vol (A4C):   36.9 ml 19.59 ml/m LA Biplane Vol: 53.9 ml 28.61 ml/m  AORTIC VALVE AV Area (Vmax):    1.31 cm AV Area (Vmean):   1.16  cm AV Area (VTI):     1.33 cm AV Vmax:           192.00 cm/s AV Vmean:          147.000 cm/s AV VTI:            0.308 m AV Peak Grad:      14.7 mmHg AV Mean Grad:      10.0 mmHg LVOT Vmax:         88.65 cm/s LVOT Vmean:        60.050 cm/s LVOT VTI:          0.144 m LVOT/AV VTI ratio: 0.47  AORTA Ao Root diam: 3.30 cm Ao Asc diam:  3.70 cm MITRAL VALVE MV Area (PHT): 4.89 cm     SHUNTS MV Decel Time: 155 msec     Systemic VTI:  0.14 m MV E velocity: 112.50 cm/s  Systemic Diam: 1.90 cm Skeet Latch MD Electronically signed by Skeet Latch MD Signature Date/Time: 01/19/2020/6:30:29 AM    Final     Cardiac Studies   ECHO 01/18/2020 1. Patient was tachycardic  in the 120s throughout the study.  2. Compared with the echo report 11/4330, systolic function is worse. Left  ventricular ejection fraction, by estimation, is 25 to 30%. The left  ventricle has severely decreased function. The left ventricle demonstrates  global hypokinesis. There is mild  concentric left ventricular hypertrophy. Left ventricular diastolic  parameters are indeterminate.  3. Right ventricular systolic function is normal. The right ventricular  size is normal.  4. Left atrial size was mildly dilated.  5. The mitral valve is normal in structure. Trivial mitral valve  regurgitation. No evidence of mitral stenosis.  6. The aortic valve has been repaired/replaced. Aortic valve  regurgitation is not visualized. No aortic stenosis is present. There is a  29 mm Edwards Sapien prosthetic (TAVR) valve present in the aortic  position. Procedure Date: March 2015. Echo  findings are consistent with normal structure and function of the aortic  valve prosthesis. Aortic valve area, by VTI measures 1.33 cm. Aortic  valve mean gradient measures 10.0 mmHg. Aortic valve Vmax measures 1.92  m/s.  7. Aortic dilatation noted. There is mild dilatation of the ascending  aorta measuring 37 mm.  8. The inferior vena cava is normal  in size with greater than 50%  respiratory variability, suggesting right atrial pressure of 3 mmHg.   Patient Profile     86 y.o. female with a hx of HTN, HLD, severe aortic stenosis s/p TAVR 02/5187, chronic systolic dysfunction with EF 45% in 2016 (not on diuretic PTA), breast CA s/p lumpectomy, hypothyroidism, GERD, TIA vs seizure admitted with acute hypoxic respiratory failure and found to have worsening LV systolic dysfunction (EF 41-66%)  Assessment & Plan    1. CHF: presume this is nonischemic (Dr. Leland Her note pre TAVR: "She underwent cardiac catheterization showing insig CAD...". 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 Increase metoprolol, restart the ACEi.  BUN starting to increase, K low. Replace K and switch to PO diuretics. 2. Dysphagia: big concern for chronic aspiration. Evaluation in progress. 3. TAVR: normal prosthetic valve function.  Notes from Dr. Mina Marble in 2019 document declining functional status due to both physical limitations and concerns for neurological impairment.       For questions or updates, please contact [place] Please consult [email or web address] for contact info under        Signed, Sanda Klein, MD  01/19/2020, 12:09 PM

## 2020-01-20 ENCOUNTER — Inpatient Hospital Stay (HOSPITAL_COMMUNITY): Payer: Medicare Other

## 2020-01-20 DIAGNOSIS — I493 Ventricular premature depolarization: Secondary | ICD-10-CM

## 2020-01-20 DIAGNOSIS — I5043 Acute on chronic combined systolic (congestive) and diastolic (congestive) heart failure: Secondary | ICD-10-CM | POA: Diagnosis not present

## 2020-01-20 DIAGNOSIS — Z952 Presence of prosthetic heart valve: Secondary | ICD-10-CM | POA: Diagnosis not present

## 2020-01-20 DIAGNOSIS — I10 Essential (primary) hypertension: Secondary | ICD-10-CM

## 2020-01-20 LAB — BASIC METABOLIC PANEL
Anion gap: 12 (ref 5–15)
BUN: 33 mg/dL — ABNORMAL HIGH (ref 8–23)
CO2: 26 mmol/L (ref 22–32)
Calcium: 8.8 mg/dL — ABNORMAL LOW (ref 8.9–10.3)
Chloride: 96 mmol/L — ABNORMAL LOW (ref 98–111)
Creatinine, Ser: 1.1 mg/dL — ABNORMAL HIGH (ref 0.44–1.00)
GFR calc Af Amer: 52 mL/min — ABNORMAL LOW (ref >60)
GFR calc non Af Amer: 45 mL/min — ABNORMAL LOW (ref >60)
Glucose, Bld: 109 mg/dL — ABNORMAL HIGH (ref 70–99)
Potassium: 3.4 mmol/L — ABNORMAL LOW (ref 3.5–5.1)
Sodium: 134 mmol/L — ABNORMAL LOW (ref 135–145)

## 2020-01-20 LAB — URINE CULTURE: Culture: >=100000 — AB

## 2020-01-20 LAB — MISC LABCORP TEST (SEND OUT): Labcorp test code: 139650

## 2020-01-20 LAB — PROCALCITONIN: Procalcitonin: 4.31 ng/mL

## 2020-01-20 MED ORDER — METOPROLOL SUCCINATE ER 25 MG PO TB24
37.5000 mg | ORAL_TABLET | Freq: Two times a day (BID) | ORAL | Status: DC
  Administered 2020-01-20 – 2020-01-21 (×2): 37.5 mg via ORAL
  Filled 2020-01-20 (×2): qty 2

## 2020-01-20 NOTE — Progress Notes (Signed)
[facility]                                                                              Patient Demographics  Betty Chan, is a 86 y.o. female, DOB - 06-02-1930, CBU:[id number]  Admit date - 01/17/2020   Admitting Physician Shela Leff, MD  Outpatient Primary MD for the patient is Maurice Small, MD  Outpatient specialists:   LOS - 3  days   Medical records reviewed and are as summarized below:    Chief Complaint  Patient presents with   Shortness of Breath       Brief summary   Patient is 86 year old female with history of severe aortic stenosis status post TAVR in 2015, breast CA status post lumpectomy, hypertension, hyperlipidemia, hypothyroidism, GERD presented with shortness of breath.  Patient reported 5-day history of dyspnea, worse with exertion.  Also coughing and ankles have been swollen.  Denies prior history of COPD or CHF. Chest x-ray showed pulmonary edema versus atypical infection CTA chest negative for PE, showed patchy atelectasis, nonspecific peribronchial thickening and cardiomegaly, BNP elevated 504.  Assessment & Plan    Principal Problem: Acute respiratory failure with hypoxia secondary to acute on chronic systolic and diastolic exacerbation of CHF (congestive heart failure) (HCC) --History of severe aortic stenosis status post TAVR, echo in 2016 ([facility]) showed EF 03%, grade 1 diastolic dysfunction -2D echo showed EF of 25 to 30% with global hypokinesis, left ventricular severely decreased function - patient placed on IV lasix, cardiology consulted. Negative balance 947cc, transitioned to oral lasix - appears to be breathing better today, O2 sats 94% on 4L.   Aspiration pneumonia -Covid negative, however given multiple comorbidities, new respiratory failure with hypoxia, age, high risk for mortality, palliative medicine consulted for goals of care. Recommendations reviewed, full code  -Allergic to penicillin, hence added  vancomycin and Flagyl - SLP revaluation today with MBS, hopefully can start on diet  - MRSA PCR negative - procalcitonin improving, will narrow antibiotics in am     Elevated troponin -Mildly elevated troponin likely due to #1,  -Currently no chest pain, cardiology following  E. coli UTI -UA showed positive nitrite, leukocytes, many bacteria, > 50 WBC -Urine culture showed more than 100,000 colonies of E. coli, continue IV Rocephin  Hypothyroidism -Continue Synthroid, TSH 1.6, previously 4.79  Essential hypertension -Continue Lasix, resume metoprolol  History of aortic stenosis status post TAVR in 2015 -2D echo as above, cardiology following  Generalized debility, Parkinson's disease  - per daughter, wheel chair bound for last 4-5 years, otherwise cognitively intact, has a hard time vocalizing (whispering is chronic for the patient per daughter) - start PT, OT today.     Pressure injury of skin -Right heel stage I, POA   Code Status: Full CODE STATUS DVT Prophylaxis:  Lovenox Family Communication: Discussed all imaging results, lab results, explained to the patient, son and daughter at the bedside on 7/29   Disposition Plan:     Status is: Inpatient  Remains inpatient appropriate because:Inpatient level of care appropriate due to severity of illness   Dispo: The patient is from: SNF  Anticipated d/c is to: SNF              Anticipated d/c date is: > 3 days              Patient currently is not medically stable to d/c., still NPO, progressively improving, not at baseline    Time Spent in minutes   70mins   Procedures:  2D echo  Consultants:   Cardiology Palliative  Antimicrobials:   Anti-infectives (From admission, onward)   Start     Dose/Rate Route Frequency Ordered Stop   01/20/20 1000  vancomycin (VANCOREADY) IVPB 750 mg/150 mL     Discontinue     750 mg 150 mL/hr over 60 Minutes Intravenous Every 12 hours 01/19/20 1510     01/19/20 1600   metroNIDAZOLE (FLAGYL) IVPB 500 mg     Discontinue     500 mg 100 mL/hr over 60 Minutes Intravenous Every 8 hours 01/19/20 1446     01/19/20 1600  vancomycin (VANCOREADY) IVPB 1500 mg/300 mL        1,500 mg 150 mL/hr over 120 Minutes Intravenous  Once 01/19/20 1510 01/19/20 2348   01/18/20 1200  cefTRIAXone (ROCEPHIN) 1 g in sodium chloride 0.9 % 100 mL IVPB     Discontinue     1 g 200 mL/hr over 30 Minutes Intravenous Every 24 hours 01/18/20 1010           Medications  Scheduled Meds:  atorvastatin  20 mg Oral Daily   enoxaparin (LOVENOX) injection  40 mg Subcutaneous Q24H   furosemide  40 mg Oral Daily   guaiFENesin  600 mg Oral BID   levothyroxine  50 mcg Oral QAC breakfast   lisinopril  5 mg Oral Daily   mouth rinse  15 mL Mouth Rinse BID   metoprolol succinate  37.5 mg Oral BID   mirabegron ER  50 mg Oral Daily   pantoprazole  40 mg Oral Daily   potassium chloride  20 mEq Oral TID   sodium chloride flush  3 mL Intravenous Q12H   Continuous Infusions:  sodium chloride     cefTRIAXone (ROCEPHIN)  IV 1 g (01/20/20 1151)   metronidazole 500 mg (01/20/20 0800)   vancomycin 750 mg (01/20/20 1322)   PRN Meds:.sodium chloride, acetaminophen, hydrALAZINE, levalbuterol, sodium chloride flush      Subjective:   [name] was seen and examined today. Breathing better, On 4L O2 via [place]. No fevers. No nausea, vomiting, chest pain, acute dyspnea. No acute events overnight   Objective:   Vitals:   01/19/20 1455 01/19/20 2037 01/20/20 0522 01/20/20 1312  BP:  123/67 (!) 121/64 (!) 136/97  Pulse:  88 79 74  Resp:  19 18 19   Temp:  99.8 F (37.7 C) 98 F (36.7 C) 98.6 F (37 C)  TempSrc:  Oral  Oral  SpO2: 93% 94% 94% 99%  Weight:      Height:        Intake/Output Summary (Last 24 hours) at 01/20/2020 1547 Last data filed at 01/20/2020 1500 Gross per 24 hour  Intake 950 ml  Output 700 ml  Net 250 ml     Wt Readings from Last 3 Encounters:   01/18/20 76.8 kg  08/25/16 74.4 kg  08/18/16 76.2 kg   Physical Exam  General: Alert and oriented x 3, NAD  Cardiovascular: S1 S2 clear, RRR. 1+ pedal edema b/l  Respiratory: scattered rhonchi B/L improving   Gastrointestinal: Soft, nontender, nondistended, NBS  Ext: 1+ pedal edema bilaterally  Neuro: no new deficits  Musculoskeletal: No cyanosis, clubbing  Skin: No rashes  Psych: Normal affect and demeanor, alert and oriented     Data Reviewed:  I have personally reviewed following labs and imaging studies  Micro Results Recent Results (from the past 240 hour(s))  SARS Coronavirus 2 by RT PCR (hospital order, performed in [facility] hospital lab) Nasopharyngeal Nasopharyngeal Swab     Status: None   Collection Time: 01/17/20  3:12 PM   Specimen: Nasopharyngeal Swab  Result Value Ref Range Status   SARS Coronavirus 2 NEGATIVE NEGATIVE Final    Comment: (NOTE) SARS-CoV-2 target nucleic acids are NOT DETECTED.  The SARS-CoV-2 RNA is generally detectable in upper and lower respiratory specimens during the acute phase of infection. The lowest concentration of SARS-CoV-2 viral copies this assay can detect is 250 copies / mL. A negative result does not preclude SARS-CoV-2 infection and should not be used as the sole basis for treatment or other patient management decisions.  A negative result may occur with improper specimen collection / handling, submission of specimen other than nasopharyngeal swab, presence of viral mutation(s) within the areas targeted by this assay, and inadequate number of viral copies (<250 copies / mL). A negative result must be combined with clinical observations, patient history, and epidemiological information.  Fact Sheet for Patients:   StrictlyIdeas.no  Fact Sheet for Healthcare Providers: BankingDealers.co.za  This test is not yet approved or  cleared by the [place] FDA and has  been authorized for detection and/or diagnosis of SARS-CoV-2 by FDA under an Emergency Use Authorization (EUA).  This EUA will remain in effect (meaning this test can be used) for the duration of the COVID-19 declaration under Section 564(b)(1) of the Act, 21 U.S.C. section 360bbb-3(b)(1), unless the authorization is terminated or revoked sooner.  Performed at [facility], [place] [address] [place], [place] [address]   Urine Culture     Status: Abnormal   Collection Time: 01/17/20  9:30 PM   Specimen: Urine, Random  Result Value Ref Range Status   Specimen Description   Final    URINE, RANDOM Performed at [place] [address] [place], [place] [address]    Special Requests   Final    NONE Performed at [facility], [place] [address] [place], [place] [address]    Culture >=100,000 COLONIES/mL ESCHERICHIA COLI (A)  Final   Report Status 01/20/2020 FINAL  Final   Organism ID, Bacteria ESCHERICHIA COLI (A)  Final      Susceptibility   Escherichia coli - MIC*    AMPICILLIN 8 SENSITIVE Sensitive     CEFAZOLIN <=4 SENSITIVE Sensitive     CEFTRIAXONE <=0.25 SENSITIVE Sensitive     CIPROFLOXACIN <=0.25 SENSITIVE Sensitive     GENTAMICIN <=1 SENSITIVE Sensitive     IMIPENEM <=0.25 SENSITIVE Sensitive     NITROFURANTOIN 32 SENSITIVE Sensitive     TRIMETH/SULFA <=20 SENSITIVE Sensitive     AMPICILLIN/SULBACTAM 4 SENSITIVE Sensitive     PIP/TAZO <=4 SENSITIVE Sensitive     * >=100,000 COLONIES/mL ESCHERICHIA COLI  MRSA PCR Screening     Status: None   Collection Time: 01/17/20 11:33 PM   Specimen: Nasal Mucosa; Nasopharyngeal  Result Value Ref Range Status   MRSA by PCR NEGATIVE NEGATIVE Final    Comment:        The GeneXpert MRSA Assay (FDA approved for NASAL specimens only), is one component  of a comprehensive MRSA colonization surveillance program. It is not intended to diagnose MRSA infection nor to guide  or monitor treatment for MRSA infections. Performed at [facility], [place] [address], [place], [place] [address]     Radiology Reports DG Chest 2 View  Result Date: 01/17/2020 CLINICAL DATA:  Shortness of breath EXAM: CHEST - 2 VIEW COMPARISON:  None. FINDINGS: The cardiomediastinal silhouette is mildly enlarged in contour.Status post TAVR. Atherosclerotic calcifications the aorta. Tortuous thoracic aorta. Hiatal hernia. Elevation the RIGHT hemidiaphragm. Diffuse reticular prominence. Mild peribronchial cuffing. No pleural effusion. No pneumothorax. LEFT basilar linear opacities, likely atelectasis. Surgical clips of the RIGHT axilla. Visualized abdomen is unremarkable. Multilevel degenerative changes of the thoracic spine. IMPRESSION: 1. Constellation of findings are favored to reflect pulmonary edema scattered areas of atelectasis. Atypical infection could present similarly. Electronically Signed   By: Valentino Saxon MD   On: 01/17/2020 15:26   CT Angio Chest PE W/Cm &/Or Wo Cm  Result Date: 01/17/2020 CLINICAL DATA:  Chest pain, shortness of breath EXAM: CT ANGIOGRAPHY CHEST WITH CONTRAST TECHNIQUE: Multidetector CT imaging of the chest was performed using the standard protocol during bolus administration of intravenous contrast. Multiplanar CT image reconstructions and MIPs were obtained to evaluate the vascular anatomy. CONTRAST:  154mL OMNIPAQUE IOHEXOL 350 MG/ML SOLN COMPARISON:  None. FINDINGS: Cardiovascular: Satisfactory opacification of the pulmonary arteries to the lobar level and some proximal segmental arteries. Evaluation is more limited at the lung bases. No evidence of pulmonary embolism. Cardiomegaly. Aortic valve replacement. No pericardial effusion. Thoracic aorta calcification. Coronary artery calcification. Mediastinum/Nodes: Nonspecific mildly enlarged right suprahilar lymph node. Esophagus is unremarkable. Lungs/Pleura: Imaging is in expiration. Patchy  atelectasis is present. Probable scarring at the lung bases. Nonspecific peribronchial thickening. No pleural effusion or pneumothorax. Upper Abdomen: No acute abnormality. Musculoskeletal: No acute osseous abnormality. No significant chest wall finding. Review of the MIP images confirms the above findings. IMPRESSION: No evidence of acute pulmonary embolism within the above limitation. Imaging in expiration with patchy atelectasis. Nonspecific peribronchial thickening. Cardiomegaly. Aortic Atherosclerosis (ICD10-I70.0). Electronically Signed   By: Macy Mis M.D.   On: 01/17/2020 18:15   DG CHEST PORT 1 VIEW  Result Date: 01/18/2020 CLINICAL DATA:  86 year old female with history of dyspnea. EXAM: PORTABLE CHEST 1 VIEW COMPARISON:  Chest x-ray 01/17/2020. FINDINGS: Lung volumes are low. Scattered areas of interstitial prominence, similar to prior examinations. No confluent consolidative airspace disease. No pleural effusions. No evidence of pulmonary edema. No pneumothorax. Heart size is normal. The patient is rotated to the left on today's exam, resulting in distortion of the mediastinal contours and reduced diagnostic sensitivity and specificity for mediastinal pathology. Aortic atherosclerosis. Status post TAVR with Edwards Sapien valve projecting over the aortic root. Surgical clips project over the right axillary region from prior lymph node dissection. IMPRESSION: 1. Low lung volumes without radiographic evidence of acute cardiopulmonary disease. 2. Aortic atherosclerosis. 3. Postoperative changes, as above. Electronically Signed   By: Vinnie Langton M.D.   On: 01/18/2020 11:04   DG Swallowing Func-Speech Pathology  Result Date: 01/20/2020 Objective Swallowing Evaluation: Type of Study: MBS-Modified Barium Swallow Study  Patient Details Name: Chantay Whitelock MRN: [id number] Date of Birth: 1929/07/24 Today's Date: 01/20/2020 Time: SLP Start Time (ACUTE ONLY): 1205 -SLP Stop Time (ACUTE ONLY): 1239  SLP Time Calculation (min) (ACUTE ONLY): 34 min Past Medical History: Past Medical History: Diagnosis Date  Aortic stenosis   s/p TAVR 08/2013 -follows at [facility] cards [facility])  Basal cell  carcinoma   Cancer of right breast ([place]) 2004  lumpectomy  Dyslipidemia   GERD (gastroesophageal reflux disease)   Heart murmur   Hypertension   Hypothyroid   OA (osteoarthritis) of knee   "right" (11/24/2014)  Osteopenia 02/13/2014  DEXA 01/2014: -2.2  PHN (postherpetic neuralgia)   L anterior thigh 2007, recurrent 04/2013  Seizure ([place]) 2007  single event, off Keppra since 11/2013; "not sure if it was a TIA or seizure"  Squamous cell cancer of skin of forearm   TIA (transient ischemic attack) 2007  "not sure if it was a TIA or seizure" Past Surgical History: Past Surgical History: Procedure Laterality Date  BASAL CELL CARCINOMA EXCISION    sternum  BREAST BIOPSY Right 2004  BREAST LUMPECTOMY Right 08/2002 X 2  CARDIAC CATHETERIZATION  "several"  CARDIAC VALVE REPLACEMENT    CATARACT EXTRACTION W/ INTRAOCULAR LENS  IMPLANT, BILATERAL Bilateral 03/2014-07/2014  SQUAMOUS CELL CARCINOMA EXCISION Left 06/2014  'forearm"  TONSILLECTOMY    TRANSCATHETER AORTIC VALVE REPLACEMENT, TRANSFEMORAL  09/06/13  [facility] HPI: 86yo female admitted 01/17/20 with SOB, cough, BLE edema - suspect CHF exacerbation. PMH: severe aortic stenosis s/p TAVR (2015) breast cancer, HTN, HLD, hypothyroidism, GERD, OA, osteopenia, seizure, TIA (2007). CXR = no evidence of acute disease.  Pt has h/o PSP vs Parkinsonism gleaned from prior MBS 2019 report.  Her daughter reports pt's voice and cough has been weak chronically.  Pt has good appetite PTA and does not cough with intake per family.  Daughter also reports pt has a decayed tooth that was diagnosed aprox 3 weeks ago - plan is for extraction in the future. Last CXR showed low lung volumes.  Subjective: pt awake in bed Assessment / Plan / Recommendation CHL IP CLINICAL IMPRESSIONS 01/20/2020 Clinical  Impression MBS completed, Full report to follow.  Patient continues with mild oropharyngeal dysphagia with sensorimotor impairments with mild worsening compared to evaluation in 06/2017.  Mild decreased oral control with oral retention spilling into pharynx.  Residuals of liquids in oral cavity were swallowed but with delay and allowed intermittent aspiration of thin. Although aspiration was trace, it did not clear with cued cough.   Delayed pharyngeal swallow initiation, anterior position of epiglottis and decreased laryngeal elevation allows laryngeal penetration of thin/nectar and mild aspiration of thin liquids.  Chin tuck worsened laryngeal penetration.   Laying pt back to 45* compensated for decreased laryngeal closure and prevented penetration as it kept bolus posterior in pharynx.    No aspiration with nectar, pudding, cracker boluses.  No pharyngeal retention of solid or puree.   Pt did not transit tablet with pudding with 2 attempts, cued to expectorate.  Upon esophageal sweep, pt appeared with some residuals without awareness, residuals appeared with lighter shading ? secretions. Thin barium swallows appeared to help clear. Radiologist not present to confirm.     With family, pt agreeing to known chronicity of aspiration risk, recommend dys3/thin.  Medicine with puree - crushed with puree if large.  Will follow up for dysphagia management.  Recommend HOB lowered to 45* for po and remain in position for at least 30 minutes after meals.  Follow solids with liquids. SLP Visit Diagnosis Dysphagia, oropharyngeal phase (R13.12) Attention and concentration deficit following -- Frontal lobe and executive function deficit following -- Impact on safety and function Moderate aspiration risk   CHL IP TREATMENT RECOMMENDATION 01/20/2020 Treatment Recommendations Therapy as outlined in treatment plan below   Prognosis 01/20/2020 Prognosis for Safe Diet Advancement Fair Barriers to Reach  Goals -- Barriers/Prognosis Comment  -- CHL IP DIET RECOMMENDATION 01/20/2020 SLP Diet Recommendations Dysphagia 3 (Mech soft) solids;Thin liquid Liquid Administration via Cup;Straw Medication Administration Whole meds with liquid Compensations Slow rate;Small sips/bites Postural Changes --   CHL IP OTHER RECOMMENDATIONS 01/20/2020 Recommended Consults -- Oral Care Recommendations Oral care QID Other Recommendations Have oral suction available   CHL IP FOLLOW UP RECOMMENDATIONS 01/20/2020 Follow up Recommendations 24 hour supervision/assistance   CHL IP FREQUENCY AND DURATION 01/20/2020 Speech Therapy Frequency (ACUTE ONLY) min 2x/week Treatment Duration 1 week;2 weeks      CHL IP ORAL PHASE 01/20/2020 Oral Phase Impaired Oral - Pudding Teaspoon -- Oral - Pudding Cup -- Oral - Honey Teaspoon -- Oral - Honey Cup -- Oral - Nectar Teaspoon -- Oral - Nectar Cup Weak lingual manipulation Oral - Nectar Straw Lingual/palatal residue;Weak lingual manipulation Oral - Thin Teaspoon Weak lingual manipulation Oral - Thin Cup Weak lingual manipulation;Lingual/palatal residue Oral - Thin Straw Weak lingual manipulation;Lingual/palatal residue Oral - Puree Reduced posterior propulsion;Lingual/palatal residue Oral - Mech Soft Reduced posterior propulsion;Impaired mastication Oral - Regular -- Oral - Multi-Consistency -- Oral - Pill Other (Comment);Decreased bolus cohesion;Delayed oral transit;Weak lingual manipulation Oral Phase - Comment --  CHL IP PHARYNGEAL PHASE 01/20/2020 Pharyngeal Phase Impaired Pharyngeal- Pudding Teaspoon -- Pharyngeal -- Pharyngeal- Pudding Cup -- Pharyngeal -- Pharyngeal- Honey Teaspoon -- Pharyngeal -- Pharyngeal- Honey Cup -- Pharyngeal -- Pharyngeal- Nectar Teaspoon -- Pharyngeal -- Pharyngeal- Nectar Cup Delayed swallow initiation-vallecula;Penetration/Aspiration during swallow;Reduced airway/laryngeal closure Pharyngeal Material enters airway, remains ABOVE vocal cords then ejected out Pharyngeal- Nectar Straw Penetration/Aspiration during  swallow;Reduced airway/laryngeal closure Pharyngeal Material enters airway, remains ABOVE vocal cords and not ejected out Pharyngeal- Thin Teaspoon -- Pharyngeal -- Pharyngeal- Thin Cup Reduced airway/laryngeal closure;Reduced laryngeal elevation;Pharyngeal residue - valleculae;Penetration/Aspiration during swallow Pharyngeal Material enters airway, passes BELOW cords without attempt by patient to eject out (silent aspiration) Pharyngeal- Thin Straw Compensatory strategies attempted (with notebox);Reduced laryngeal elevation;Reduced airway/laryngeal closure Pharyngeal Material enters airway, passes BELOW cords without attempt by patient to eject out (silent aspiration) Pharyngeal- Puree WFL Pharyngeal Material does not enter airway Pharyngeal- Mechanical Soft WFL Pharyngeal Material does not enter airway Pharyngeal- Regular -- Pharyngeal -- Pharyngeal- Multi-consistency -- Pharyngeal -- Pharyngeal- Pill NT Pharyngeal -- Pharyngeal Comment pt with TRACE laryngeal penetration of thin with HOB lowered to 45*- trace penetration x1 of  boluses with HOB lowered  CHL IP CERVICAL ESOPHAGEAL PHASE 01/20/2020 Cervical Esophageal Phase Impaired Pudding Teaspoon -- Pudding Cup -- Honey Teaspoon -- Honey Cup -- Nectar Teaspoon -- Nectar Cup -- Nectar Straw -- Thin Teaspoon -- Thin Cup -- Thin Straw -- Puree -- Mechanical Soft -- Regular -- Multi-consistency -- Pill -- Cervical Esophageal Comment Pt did not transit tablet with pudding with 2 attempts, cued to expectorate.  Upon esophageal sweep, pt appeared with some residuals without awareness, residuals appeared with lighter shading ? secretions. Thin barium swallows appeared to help clear. Radiologist not present to confirm. Kathleen Lime, MS [facility] SLP Acute Rehab Services Office [phone] Macario Golds 01/20/2020, 2:06 PM              ECHOCARDIOGRAM COMPLETE  Result Date: 01/19/2020    ECHOCARDIOGRAM REPORT   Patient Name:   Betty Chan Date of Exam: 01/18/2020 Medical  Rec #:  [id number]        Height:       67.0 in Accession #:    [id number]       Weight:  169.3 lb Date of Birth:  [birth year]       BSA:          1.884 m Patient Age:    68 years         BP:           123/50 mmHg Patient Gender: F                HR:           128 bpm. Exam Location:  Inpatient Procedure: 2D Echo, Cardiac Doppler and Color Doppler Indications:    I50.23 Acute on chronic systolic (congestive) heart failure  History:        Patient has no prior history of Echocardiogram examinations.                 TIA, Aortic Valve Disease, Signs/Symptoms:Murmur; Risk                 Factors:Hypertension and Dyslipidemia. Hypothyroidism. Seizures.                 Cancer. GERD.                 Aortic Valve: 29 mm Edwards Sapien prosthetic, stented (TAVR)                 valve is present in the aortic position. Procedure Date: March                 2015.  Sonographer:    Jonelle Sidle Dance Referring Phys: [id number] [place]  1. Patient was tachycardic in the 120s throughout the study.  2. Compared with the echo report 09/5407, systolic function is worse. Left ventricular ejection fraction, by estimation, is 25 to 30%. The left ventricle has severely decreased function. The left ventricle demonstrates global hypokinesis. There is mild concentric left ventricular hypertrophy. Left ventricular diastolic parameters are indeterminate.  3. Right ventricular systolic function is normal. The right ventricular size is normal.  4. Left atrial size was mildly dilated.  5. The mitral valve is normal in structure. Trivial mitral valve regurgitation. No evidence of mitral stenosis.  6. The aortic valve has been repaired/replaced. Aortic valve regurgitation is not visualized. No aortic stenosis is present. There is a 29 mm Edwards Sapien prosthetic (TAVR) valve present in the aortic position. Procedure Date: March 2015. Echo findings are consistent with normal structure and function of the aortic valve prosthesis.  Aortic valve area, by VTI measures 1.33 cm. Aortic valve mean gradient measures 10.0 mmHg. Aortic valve Vmax measures 1.92 m/s.  7. Aortic dilatation noted. There is mild dilatation of the ascending aorta measuring 37 mm.  8. The inferior vena cava is normal in size with greater than 50% respiratory variability, suggesting right atrial pressure of 3 mmHg. FINDINGS  Left Ventricle: Compared with the echo report 01/1190, systolic function is worse. Left ventricular ejection fraction, by estimation, is 25 to 30%. The left ventricle has severely decreased function. The left ventricle demonstrates global hypokinesis. The left ventricular internal cavity size was normal in size. There is mild concentric left ventricular hypertrophy. Left ventricular diastolic parameters are indeterminate. Right Ventricle: The right ventricular size is normal. No increase in right ventricular wall thickness. Right ventricular systolic function is normal. Left Atrium: Left atrial size was mildly dilated. Right Atrium: Right atrial size was normal in size. Pericardium: There is no evidence of pericardial effusion. Mitral Valve: Mitral valve leaflet tips appear to be attached as though she had a  Mitraclip or Alfieri stitch. The mitral valve is normal in structure. Normal mobility of the mitral valve leaflets. Mild mitral annular calcification. Trivial mitral valve regurgitation. No evidence of mitral valve stenosis. Tricuspid Valve: The tricuspid valve is normal in structure. Tricuspid valve regurgitation is not demonstrated. No evidence of tricuspid stenosis. Aortic Valve: The aortic valve has been repaired/replaced. Aortic valve regurgitation is not visualized. No aortic stenosis is present. Aortic valve mean gradient measures 10.0 mmHg. Aortic valve peak gradient measures 14.7 mmHg. Aortic valve area, by VTI measures 1.33 cm. There is a 29 mm Edwards Sapien prosthetic, stented (TAVR) valve present in the aortic position. Procedure Date:  March 2015. Echo findings are consistent with normal structure and function of the aortic valve prosthesis. Pulmonic Valve: The pulmonic valve was normal in structure. Pulmonic valve regurgitation is not visualized. No evidence of pulmonic stenosis. Aorta: Aortic dilatation noted. There is mild dilatation of the ascending aorta measuring 37 mm. Venous: The inferior vena cava is normal in size with greater than 50% respiratory variability, suggesting right atrial pressure of 3 mmHg. IAS/Shunts: No atrial level shunt detected by color flow Doppler.  LEFT VENTRICLE PLAX 2D LVIDd:         5.10 cm LVIDs:         4.50 cm LV PW:         1.10 cm LV IVS:        1.30 cm LVOT diam:     1.90 cm LV SV:         41 LV SV Index:   22 LVOT Area:     2.84 cm  RIGHT VENTRICLE          IVC RV Basal diam:  2.50 cm  IVC diam: 1.50 cm TAPSE (M-mode): 1.2 cm LEFT ATRIUM             Index       RIGHT ATRIUM           Index LA diam:        4.60 cm 2.44 cm/m  RA Area:     12.20 cm LA Vol (A2C):   73.4 ml 38.96 ml/m RA Volume:   25.10 ml  13.32 ml/m LA Vol (A4C):   36.9 ml 19.59 ml/m LA Biplane Vol: 53.9 ml 28.61 ml/m  AORTIC VALVE AV Area (Vmax):    1.31 cm AV Area (Vmean):   1.16 cm AV Area (VTI):     1.33 cm AV Vmax:           192.00 cm/s AV Vmean:          147.000 cm/s AV VTI:            0.308 m AV Peak Grad:      14.7 mmHg AV Mean Grad:      10.0 mmHg LVOT Vmax:         88.65 cm/s LVOT Vmean:        60.050 cm/s LVOT VTI:          0.144 m LVOT/AV VTI ratio: 0.47  AORTA Ao Root diam: 3.30 cm Ao Asc diam:  3.70 cm MITRAL VALVE MV Area (PHT): 4.89 cm     SHUNTS MV Decel Time: 155 msec     Systemic VTI:  0.14 m MV E velocity: 112.50 cm/s  Systemic Diam: 1.90 cm Skeet Latch MD Electronically signed by Skeet Latch MD Signature Date/Time: 01/19/2020/6:30:29 AM    Final     Lab Data:  CBC: Recent  Labs  Lab 01/17/20 1500 01/19/20 0421  WBC 5.3 9.3  NEUTROABS 3.8  --   HGB 12.1 12.3  HCT 38.3 37.2  MCV 99.5 94.7    PLT 215 921   Basic Metabolic Panel: Recent Labs  Lab 01/17/20 1500 01/18/20 0429 01/19/20 0421 01/20/20 0440  NA 136 135 136 134*  K 4.2 3.9 3.4* 3.4*  CL 101 98 95* 96*  CO2 27 28 29 26   GLUCOSE 82 116* 107* 109*  BUN 29* 22 27* 33*  CREATININE 0.80 0.87 1.02* 1.10*  CALCIUM 8.8* 8.6* 8.9 8.8*  MG  --  1.8  --   --    GFR: Estimated Creatinine Clearance: 38.5 mL/min (A) (by C-G formula based on SCr of 1.1 mg/dL (H)). Liver Function Tests: Recent Labs  Lab 01/17/20 1500  AST 28  ALT 26  ALKPHOS 56  BILITOT 0.7  PROT 7.6  ALBUMIN 3.9   No results for input(s): LIPASE, AMYLASE in the last 168 hours. No results for input(s): AMMONIA in the last 168 hours. Coagulation Profile: No results for input(s): INR, PROTIME in the last 168 hours. Cardiac Enzymes: No results for input(s): CKTOTAL, CKMB, CKMBINDEX, TROPONINI in the last 168 hours. BNP (last 3 results) No results for input(s): PROBNP in the last 8760 hours. HbA1C: No results for input(s): HGBA1C in the last 72 hours. CBG: No results for input(s): GLUCAP in the last 168 hours. Lipid Profile: No results for input(s): CHOL, HDL, LDLCALC, TRIG, CHOLHDL, LDLDIRECT in the last 72 hours. Thyroid Function Tests: Recent Labs    01/18/20 0429 01/18/20 0930  TSH 1.600  --   FREET4  --  0.79   Anemia Panel: No results for input(s): VITAMINB12, FOLATE, FERRITIN, TIBC, IRON, RETICCTPCT in the last 72 hours. Urine analysis:    Component Value Date/Time   COLORURINE YELLOW 01/17/2020 2130   APPEARANCEUR HAZY (A) 01/17/2020 2130   LABSPEC 1.010 01/17/2020 2130   PHURINE 5.5 01/17/2020 2130   GLUCOSEU NEGATIVE 01/17/2020 2130   HGBUR TRACE (A) 01/17/2020 2130   BILIRUBINUR NEGATIVE 01/17/2020 2130   KETONESUR NEGATIVE 01/17/2020 2130   PROTEINUR NEGATIVE 01/17/2020 2130   UROBILINOGEN 1.0 11/25/2014 0220   NITRITE POSITIVE (A) 01/17/2020 2130   LEUKOCYTESUR SMALL (A) 01/17/2020 2130     Kirah Stice  M.D. [facility] 01/20/2020, 3:47 PM   Call night coverage person covering after 7pm

## 2020-01-20 NOTE — Progress Notes (Signed)
  Speech Language Pathology Treatment: Dysphagia  Patient Details Name: Betty Chan MRN: [id number] DOB: 13-Nov-1929 Today's Date: 01/20/2020 Time: 6659-9357 SLP Time Calculation (min) (ACUTE ONLY): 28 min  Assessment / Plan / Recommendation Clinical Impression  Today pt with much improvement in breathing.  She continues with decreased ability to clear secretion.  SLP faciliated oral care with use of oral suctioning.  SLP observed pt consuming thin coffee- no indication of aspiration with coffee.  At this time, will proceed with MBS given pt's h/o dysphagia. Pt able to expectorate some secretion but likely has some secretions still retained in pharynx/larynx that she does not clear.  Advised pt and her son that she will remain a chronic aspiration risk regardless of swallow function. MBS ordered and planned for today at noon.  MD, RN and pt/son educate. Recommend coffee and water be allowed pending MBS.  Encouraged pt to continue to work on strength of cough/expectoration.Marland Kitchen   HPI HPI: 86yo female admitted 01/17/20 with SOB, cough, BLE edema - suspect CHF exacerbation. PMH: severe aortic stenosis s/p TAVR (2015) breast cancer, HTN, HLD, hypothyroidism, GERD, OA, osteopenia, seizure, TIA (2007). CXR = no evidence of acute disease.  Pt has h/o PSP vs Parkinsonism gleaned from prior MBS 2019 report.  Her daughter reports pt's voice and cough has been weak chronically.  Pt has good appetite PTA and does not cough with intake per family.  Daughter also reports pt has a decayed tooth that was diagnosed aprox 3 weeks ago - plan is for extraction in the future.      SLP Plan  Continue with current plan of care       Recommendations  Medication Administration: Via alternative means Supervision: Patient able to self feed Compensations: Slow rate;Small sips/bites Postural Changes and/or Swallow Maneuvers: Seated upright 90 degrees                Oral Care Recommendations: Oral care prior to ice  chip/H20;Oral care QID;Staff/trained caregiver to provide oral care Follow up Recommendations: 24 hour supervision/assistance SLP Visit Diagnosis: Dysphagia, unspecified (R13.10) Plan: Continue with current plan of care       GO                Macario Golds 01/20/2020, 10:24 AM   Betty Lime, MS [place] Office [phone]

## 2020-01-20 NOTE — Progress Notes (Signed)
MBS completed, Full report to follow.  Patient continues with mild oropharyngeal dysphagia with sensorimotor impairments with mild worsening compared to evaluation in 06/2017.  Mild decreased oral control with oral retention spilling into pharynx.  Residuals of liquids in oral cavity were swallowed but with delay and allowed intermittent aspiration of thin. Although aspiration was trace, it did not clear with cued cough.   Delayed pharyngeal swallow initiation, anterior position of epiglottis and decreased laryngeal elevation allows laryngeal penetration of thin/nectar and mild aspiration of thin liquids.  Chin tuck worsened laryngeal penetration.   Laying pt back to 45* compensated for decreased laryngeal closure and prevented penetration as it kept bolus posterior in pharynx.    No aspiration with nectar, pudding, cracker boluses.  No pharyngeal retention of solid or puree.    Pt dd not transit tablet with pudding with 2 attempts, cued to expectorate.  Upon esophageal sweep, pt appeared with some residuals without awareness, residuals appeared with lighter shading ? secretions. Thin barium swallows appeared to help clear. Radiologist not present to confirm.      With family, pt agreeing to known chronicity of aspiration risk, recommend dys3/thin.  Medicine with puree - crushed with puree if large.  Will follow up for dysphagia management.  Recommend HOB lowered to 45* for po and remain in position for at least 30 minutes after meals.  Follow solids with liquids.    Kathleen Lime, MS [place] Office [phone]

## 2020-01-20 NOTE — Evaluation (Signed)
Occupational Therapy Evaluation Patient Details Name: Betty Chan MRN: [id number] DOB: 1929-08-24 Today's Date: 01/20/2020    History of Present Illness Patient is 86 year old female with history of severe aortic stenosis status post TAVR in 2015, breast CA status post lumpectomy, hypertension, hyperlipidemia, hypothyroidism, GERD presented with shortness of breath.Chest x-ray showed pulmonary edema versus atypical infection.  CTA chest negative for PE, showed patchy atelectasis, nonspecific peribronchial thickening and cardiomegaly   Clinical Impression   Betty Chan is an 87 year old woman who presents with generalized weakness and decreased activity tolerance limiting her ability to perform baseline transfers and participation in ADLs. Evaluation limited to bed  eval due to patient having frequent bowel movements. Patient able to roll left and right with min-mod assist. Patient total assist for toileting/pericare. Patient demonstrates functional ROM and coordination of upper extremities with noticeable tremors but overall generally weak. Patient will benefit from skilled OT services to improve deficits and improve abilities to baseline in order to return home with caregivers.    Follow Up Recommendations  No OT follow up    Equipment Recommendations  None recommended by OT    Recommendations for Other Services       Precautions / Restrictions Precautions Precautions: Fall Precaution Comments: incontinent of B/B ,check before mobilizing Restrictions Weight Bearing Restrictions: No      Mobility Bed Mobility Overal bed mobility: Needs Assistance Bed Mobility: Rolling Rolling: Min assist;Mod assist         General bed mobility comments: patient follows directions to assit with rollng to each side multiple times after incontinent of B/B with purwick in place.  Transfers                 General transfer comment: NT due to frequent BM    Balance                                            ADL either performed or assessed with clinical judgement   ADL Overall ADL's : Needs assistance/impaired Eating/Feeding: Set up;Bed level   Grooming: Set up;Bed level   Upper Body Bathing: Set up;Bed level;Minimal assistance   Lower Body Bathing: Maximal assistance;Bed level;Set up   Upper Body Dressing : Moderate assistance;Bed level   Lower Body Dressing: Total assistance;Bed level;+2 for physical assistance       Toileting- Clothing Manipulation and Hygiene: Total assistance;+2 for physical assistance;Bed level               Vision   Vision Assessment?: No apparent visual deficits     Perception     Praxis      Pertinent Vitals/Pain Pain Assessment: No/denies pain Faces Pain Scale: Hurts little more Pain Location: cramp in left left/hamstring - SlP massaged and pt reported it abated Pain Descriptors / Indicators: Cramping Pain Intervention(s): Monitored during session     Hand Dominance Right   Extremity/Trunk Assessment Upper Extremity Assessment Upper Extremity Assessment: Generalized weakness   Lower Extremity Assessment Lower Extremity Assessment: Generalized weakness;RLE deficits/detail RLE Deficits / Details: decreased knee extension   Cervical / Trunk Assessment Cervical / Trunk Assessment: Kyphotic   Communication Communication Communication: No difficulties;HOH   Cognition Arousal/Alertness: Awake/alert Behavior During Therapy: WFL for tasks assessed/performed Overall Cognitive Status: Within Functional Limits for tasks assessed  General Comments: answers questions   General Comments  NT    Exercises     Shoulder Instructions      Home Living Family/patient expects to be discharged to:: Assisted living   Available Help at Discharge: Personal care attendant;Available 24 hours/day Type of Home: Apartment Home Access: Level entry                      Home Equipment: Walker - 2 wheels;Wheelchair - manual          Prior Functioning/Environment Level of Independence: Needs assistance  Gait / Transfers Assistance Needed: Assistance to stand with walker and transfer ADL's / Homemaking Assistance Needed: Assistance with bathing, dressing, toileting. Patient reports she is able to help with donning socks and pants and bra. Communication / Swallowing Assistance Needed: soft voice Comments: sounds like patient has assistance for ADL's and transfers        OT Problem List: Decreased strength;Decreased activity tolerance      OT Treatment/Interventions: Self-care/ADL training;Therapeutic activities;Therapeutic exercise;Patient/family education    OT Goals(Current goals can be found in the care plan section) Acute Rehab OT Goals Patient Stated Goal: to get OOB OT Goal Formulation: With patient Time For Goal Achievement: 02/03/20 Potential to Achieve Goals: Fair  OT Frequency: Min 2X/week   Barriers to D/C:            Co-evaluation              AM-PAC OT "6 Clicks" Daily Activity     Outcome Measure Help from another person eating meals?: A Little Help from another person taking care of personal grooming?: A Little Help from another person toileting, which includes using toliet, bedpan, or urinal?: Total Help from another person bathing (including washing, rinsing, drying)?: A Lot Help from another person to put on and taking off regular upper body clothing?: A Lot Help from another person to put on and taking off regular lower body clothing?: Total 6 Click Score: 12   End of Session Nurse Communication: Mobility status (frequent BMs)  Activity Tolerance: Patient tolerated treatment well Patient left: in bed;with call bell/phone within reach  OT Visit Diagnosis: Muscle weakness (generalized) (M62.81)                Time: 3893-7342 OT Time Calculation (min): 29 min Charges:  OT General  Charges $OT Visit: 1 Visit OT Evaluation $OT Eval Moderate Complexity: 1 Mod  Lynton Crescenzo, OTR/L [place]  Office [phone] Pager: [place] 01/20/2020, 4:48 PM

## 2020-01-20 NOTE — Plan of Care (Signed)
  Problem: Clinical Measurements: Goal: Ability to maintain clinical measurements within normal limits will improve Outcome: Progressing Goal: Will remain free from infection Outcome: Progressing   

## 2020-01-20 NOTE — Evaluation (Signed)
`                                       `   Physical Therapy Evaluation Patient Details Name: Betty Chan MRN: [id number] DOB: 08/12/1929 Today's Date: 01/20/2020   History of Present Illness  Patient is 86 year old female with history of severe aortic stenosis status post TAVR in 2015, breast CA status post lumpectomy, hypertension, hyperlipidemia, hypothyroidism, GERD presented with shortness of breath.Chest x-ray showed pulmonary edema versus atypical infection.  CTA chest negative for PE, showed patchy atelectasis, nonspecific peribronchial thickening and cardiomegaly  Clinical Impression  The patient found with incontinence of BM with purewick. Assisted with rolling to be washed up. Patient indicates that she  Gets OOB at facilty with assistance. Deferred OOB until next visit due to BM. Pt admitted with above diagnosis.  Pt currently with functional limitations due to the deficits listed below (see PT Problem List). Pt will benefit from skilled PT to increase their independence and safety with mobility to allow discharge to the venue listed below.       Follow Up Recommendations  (return to ALF with 24/7 caregivers.)    Equipment Recommendations  None recommended by PT    Recommendations for Other Services       Precautions / Restrictions Precautions Precautions: Fall Precaution Comments: incontinent of B/B ,check before mobilizing Restrictions Weight Bearing Restrictions: No      Mobility  Bed Mobility Overal bed mobility: Needs Assistance Bed Mobility: Rolling Rolling: Min assist;Mod assist         General bed mobility comments: patient follows directions to assit with rollng to each side multiple times after incontinent of B/B with purwick in place.  Transfers                 General transfer comment: NT due to frequent BM  Ambulation/Gait                Stairs            Wheelchair Mobility    Modified Rankin (Stroke Patients Only)        Balance                                             Pertinent Vitals/Pain Pain Assessment: No/denies pain Faces Pain Scale: Hurts little more Pain Location: cramp in left left/hamstring - SlP massaged and pt reported it abated Pain Descriptors / Indicators: Cramping Pain Intervention(s): Monitored during session    Home Living Family/patient expects to be discharged to:: Assisted living   Available Help at Discharge: Personal care attendant;Available 24 hours/day Type of Home: Apartment Home Access: Level entry       Home Equipment: Walker - 2 wheels;Wheelchair - manual      Prior Function Level of Independence: Needs assistance   Gait / Transfers Assistance Needed: Assistance to stand with walker and transfer  ADL's / Homemaking Assistance Needed: Assistance with bathing, dressing, toileting. Patient reports she is able to help with donning socks and pants and bra.  Comments: sounds like patient has assistance for ADL's and transfers     Hand Dominance   Dominant Hand: Right    Extremity/Trunk Assessment   Upper Extremity Assessment Upper Extremity Assessment: Generalized weakness    Lower Extremity Assessment  Lower Extremity Assessment: Generalized weakness;RLE deficits/detail RLE Deficits / Details: decreased knee extension    Cervical / Trunk Assessment Cervical / Trunk Assessment: Kyphotic  Communication   Communication: No difficulties;HOH  Cognition Arousal/Alertness: Awake/alert Behavior During Therapy: WFL for tasks assessed/performed Overall Cognitive Status: Within Functional Limits for tasks assessed                                 General Comments: answers questions      General Comments General comments (skin integrity, edema, etc.): NT    Exercises     Assessment/Plan    PT Assessment Patient needs continued PT services  PT Problem List Decreased strength;Decreased mobility;Decreased range of  motion;Decreased knowledge of precautions;Decreased activity tolerance;Decreased cognition;Cardiopulmonary status limiting activity;Decreased balance;Decreased knowledge of use of DME;Pain       PT Treatment Interventions Therapeutic activities;Therapeutic exercise;Patient/family education;Functional mobility training    PT Goals (Current goals can be found in the Care Plan section)  Acute Rehab PT Goals Patient Stated Goal: to get OOB PT Goal Formulation: With patient Time For Goal Achievement: 02/03/20 Potential to Achieve Goals: Fair    Frequency Min 2X/week   Barriers to discharge        Co-evaluation               AM-PAC PT "6 Clicks" Mobility  Outcome Measure Help needed turning from your back to your side while in a flat bed without using bedrails?: A Lot Help needed moving from lying on your back to sitting on the side of a flat bed without using bedrails?: A Lot Help needed moving to and from a bed to a chair (including a wheelchair)?: Total Help needed standing up from a chair using your arms (e.g., wheelchair or bedside chair)?: Total Help needed to walk in hospital room?: Total Help needed climbing 3-5 steps with a railing? : Total 6 Click Score: 8    End of Session   Activity Tolerance: Patient limited by fatigue;Patient tolerated treatment well Patient left: in bed;with call bell/phone within reach;with bed alarm set Nurse Communication: Mobility status PT Visit Diagnosis: Unsteadiness on feet (R26.81)    Time: 5643-3295 PT Time Calculation (min) (ACUTE ONLY): 23 min   Charges:   PT Evaluation $PT Eval Moderate Complexity: [place] Pager [phone] Office [phone]   Claretha Cooper 01/20/2020, 4:44 PM

## 2020-01-20 NOTE — Progress Notes (Signed)
The patient is currently in Radiology for a modified barium swallow. Telemetry reviewed: overnight with very frequent PVCs (25% and higher), due to frequent periods of bigeminy and trigeminy. After about 1000h (meds administration), the prevalence of PVCs is much lower (10%). Vast majority of PVCs have same morphology. Sinus rate in 70-80 range. BP 120s/60s. Will try to push the beta blocker dose further, since the recent fall in LVEF could represent PVC-cardiomyopathy.

## 2020-01-20 NOTE — Progress Notes (Signed)
Palliative:  I came to bedside and PT going to work with patient. No family currently at bedside. She is now on diet and is having some improvements so no urgent needs currently. I have requested Dr. Domingo Cocking to follow for needs and more acute palliative discussions/decisions over the weekend. [employer] booklet provided to son Ronalee Belts yesterday. They would benefit from outpatient palliative follow up for anticipatory guidance if they are willing to accept.   No charge  Vinie Sill, NP Palliative Medicine Team Pager [phone] (Please see [email or web address] for schedule) Team Phone [phone]

## 2020-01-21 LAB — BASIC METABOLIC PANEL
Anion gap: 12 (ref 5–15)
BUN: 38 mg/dL — ABNORMAL HIGH (ref 8–23)
CO2: 27 mmol/L (ref 22–32)
Calcium: 8.8 mg/dL — ABNORMAL LOW (ref 8.9–10.3)
Chloride: 99 mmol/L (ref 98–111)
Creatinine, Ser: 1.04 mg/dL — ABNORMAL HIGH (ref 0.44–1.00)
GFR calc Af Amer: 56 mL/min — ABNORMAL LOW (ref >60)
GFR calc non Af Amer: 48 mL/min — ABNORMAL LOW (ref >60)
Glucose, Bld: 124 mg/dL — ABNORMAL HIGH (ref 70–99)
Potassium: 3.2 mmol/L — ABNORMAL LOW (ref 3.5–5.1)
Sodium: 138 mmol/L (ref 135–145)

## 2020-01-21 MED ORDER — METOPROLOL SUCCINATE ER 50 MG PO TB24
50.0000 mg | ORAL_TABLET | Freq: Two times a day (BID) | ORAL | Status: DC
  Administered 2020-01-21 – 2020-01-31 (×17): 50 mg via ORAL
  Filled 2020-01-21 (×21): qty 1

## 2020-01-21 NOTE — Progress Notes (Signed)
Palliative Care Brief Note  Discussed with Dr. Tana Coast.  Request for further palliative follow-up to continue [place] conversation.  I stopped by to see Ms. Galvis this evening.  She was eating dinner with assistance of RN.  No family present in room.  Will reach out to family to continue discussion.  Micheline Rough, MD [place] Palliative Medicine Team [phone]  NO CHARGE NOTE

## 2020-01-21 NOTE — Progress Notes (Signed)
[facility]                                                                              Patient Demographics  Betty Chan, is a 86 y.o. female, DOB - 07/23/1929, ELF:[id number]  Admit date - 01/17/2020   Admitting Physician Shela Leff, MD  Outpatient Primary MD for the patient is Maurice Small, MD  Outpatient specialists:   LOS - 4  days   Medical records reviewed and are as summarized below:    Chief Complaint  Patient presents with  . Shortness of Breath       Brief summary   Patient is 86 year old female with history of severe aortic stenosis status post TAVR in 2015, breast CA status post lumpectomy, hypertension, hyperlipidemia, hypothyroidism, GERD presented with shortness of breath.  Patient reported 5-day history of dyspnea, worse with exertion.  Also coughing and ankles have been swollen.  Denies prior history of COPD or CHF. Chest x-ray showed pulmonary edema versus atypical infection CTA chest negative for PE, showed patchy atelectasis, nonspecific peribronchial thickening and cardiomegaly, BNP elevated 504.  Assessment & Plan    Principal Problem: Acute respiratory failure with hypoxia secondary to acute on chronic systolic and diastolic exacerbation of CHF (congestive heart failure) (HCC) -- History of severe aortic stenosis status post TAVR, echo in 2016 ([facility]) showed EF 58%, grade 1 diastolic dysfunction - 2D echo showed EF of 25 to 30% with global hypokinesis, left ventricular severely decreased function -Patient placed on IV Lasix, was increased to 40 mg every 12 hours now transition to oral - cardiology was consulted, cardiac cath in 2015 had showed no significant CAD -Per cards, continue with low-dose ACE inhibitor, beta-blocker increased to 50 mg twice a day  Aspiration pneumonia -Covid negative, however given multiple comorbidities, new respiratory failure with hypoxia, age, high risk for mortality, palliative medicine  consulted for goals of care. Recommendations reviewed, full code  -Allergic to penicillin, hence added vancomycin and Flagyl - SLP revaluation today with MBS, hopefully can start on diet  - MRSA PCR negative -Continue current IV antibiotics -Need aggressive goals of care discussion with the patient's family, appears to be lethargic overall poor prognosis with multiple comorbidities, age   Elevated troponin -Mildly elevated troponin likely due to #1, no chest pain -Cardiology following  E. coli UTI -UA showed positive nitrite, leukocytes, many bacteria, > 50 WBC -Urine culture showed more than 100,000 colonies of E. Coli -Continue IV Rocephin  Hypothyroidism -Continue Synthroid, TSH 1.6, previously 4.79  Essential hypertension -Continue Lasix, beta-blocker, ACE inhibitor  Hypokalemia Currently on p.o. replacement  History of aortic stenosis status post TAVR in 2015 -2D echo as above, cardiology following  Generalized debility, Parkinson's disease  - per daughter, wheel chair bound for last 4-5 years, otherwise cognitively intact, has a hard time vocalizing (whispering is chronic for the patient per daughter) -PT OT -Currently appears to be lethargic, able to open eyes but does not respond - start dysphagia 3 diet     Pressure injury of skin -Right heel stage I, POA   Code Status: Full CODE STATUS DVT Prophylaxis:  Lovenox Family Communication: Discussed  all imaging results, lab results, explained to the patient's son-in-law.  Disposition Plan:     Status is: Inpatient  Remains inpatient appropriate because:Inpatient level of care appropriate due to severity of illness   Dispo: The patient is from: SNF              Anticipated d/c is to: SNF              Anticipated d/c date is: > 3 days              Patient currently is not medically stable to d/c.    Time Spent in minutes   75mins   Procedures:  2D echo  Consultants:    Cardiology Palliative  Antimicrobials:   Anti-infectives (From admission, onward)   Start     Dose/Rate Route Frequency Ordered Stop   01/20/20 1000  vancomycin (VANCOREADY) IVPB 750 mg/150 mL     Discontinue     750 mg 150 mL/hr over 60 Minutes Intravenous Every 12 hours 01/19/20 1510     01/19/20 1600  metroNIDAZOLE (FLAGYL) IVPB 500 mg     Discontinue     500 mg 100 mL/hr over 60 Minutes Intravenous Every 8 hours 01/19/20 1446     01/19/20 1600  vancomycin (VANCOREADY) IVPB 1500 mg/300 mL        1,500 mg 150 mL/hr over 120 Minutes Intravenous  Once 01/19/20 1510 01/19/20 2348   01/18/20 1200  cefTRIAXone (ROCEPHIN) 1 g in sodium chloride 0.9 % 100 mL IVPB     Discontinue     1 g 200 mL/hr over 30 Minutes Intravenous Every 24 hours 01/18/20 1010           Medications  Scheduled Meds: . atorvastatin  20 mg Oral Daily  . enoxaparin (LOVENOX) injection  40 mg Subcutaneous Q24H  . furosemide  40 mg Oral Daily  . guaiFENesin  600 mg Oral BID  . levothyroxine  50 mcg Oral QAC breakfast  . lisinopril  5 mg Oral Daily  . mouth rinse  15 mL Mouth Rinse BID  . metoprolol succinate  50 mg Oral BID  . mirabegron ER  50 mg Oral Daily  . pantoprazole  40 mg Oral Daily  . potassium chloride  20 mEq Oral TID  . sodium chloride flush  3 mL Intravenous Q12H   Continuous Infusions: . sodium chloride    . cefTRIAXone (ROCEPHIN)  IV 1 g (01/21/20 1312)  . metronidazole 500 mg (01/21/20 0840)  . vancomycin 750 mg (01/21/20 1124)   PRN Meds:.sodium chloride, acetaminophen, hydrALAZINE, levalbuterol, sodium chloride flush      Subjective:   Betty Chan was seen and examined today.  Somewhat lethargic today, no acute issues, no fevers no nausea vomiting or diarrhea.  No acute shortness of breath.  No acute events overnight.  Objective:   Vitals:   01/20/20 1312 01/20/20 2022 01/21/20 0501 01/21/20 0621  BP: (!) 136/97 121/67 (!) 134/70   Pulse: 74 84 70   Resp: 19 19 14     Temp: 98.6 F (37 C) 99.2 F (37.3 C) 98.7 F (37.1 C)   TempSrc: Oral Oral Oral   SpO2: 99% 97% 96%   Weight:    74.4 kg  Height:        Intake/Output Summary (Last 24 hours) at 01/21/2020 1317 Last data filed at 01/21/2020 1015 Gross per 24 hour  Intake 780 ml  Output --  Net 780 ml     Wt  Readings from Last 3 Encounters:  01/21/20 74.4 kg  08/25/16 74.4 kg  08/18/16 76.2 kg   Physical Exam  General: Somnolent and lethargic but arousable  Cardiovascular: S1 S2 clear, RRR.  1+ pedal edema b/l  Respiratory: Scattered rhonchi bilaterally  Gastrointestinal: Soft, nontender, nondistended, NBS  Ext: 1+ pedal edema bilaterally  Neuro: not following commands  Musculoskeletal: No cyanosis, clubbing  Skin: No rashes  Psych: somewhat lethargic today        Data Reviewed:  I have personally reviewed following labs and imaging studies  Micro Results Recent Results (from the past 240 hour(s))  SARS Coronavirus 2 by RT PCR (hospital order, performed in [place] hospital lab) Nasopharyngeal Nasopharyngeal Swab     Status: None   Collection Time: 01/17/20  3:12 PM   Specimen: Nasopharyngeal Swab  Result Value Ref Range Status   SARS Coronavirus 2 NEGATIVE NEGATIVE Final    Comment: (NOTE) SARS-CoV-2 target nucleic acids are NOT DETECTED.  The SARS-CoV-2 RNA is generally detectable in upper and lower respiratory specimens during the acute phase of infection. The lowest concentration of SARS-CoV-2 viral copies this assay can detect is 250 copies / mL. A negative result does not preclude SARS-CoV-2 infection and should not be used as the sole basis for treatment or other patient management decisions.  A negative result may occur with improper specimen collection / handling, submission of specimen other than nasopharyngeal swab, presence of viral mutation(s) within the areas targeted by this assay, and inadequate number of viral copies (<250 copies / mL). A  negative result must be combined with clinical observations, patient history, and epidemiological information.  Fact Sheet for Patients:   StrictlyIdeas.no  Fact Sheet for Healthcare Providers: BankingDealers.co.za  This test is not yet approved or  cleared by the [place] FDA and has been authorized for detection and/or diagnosis of SARS-CoV-2 by FDA under an Emergency Use Authorization (EUA).  This EUA will remain in effect (meaning this test can be used) for the duration of the COVID-19 declaration under Section 564(b)(1) of the Act, 21 U.S.C. section 360bbb-3(b)(1), unless the authorization is terminated or revoked sooner.  Performed at [facility], [place] [address], [place], [place] [address]   Urine Culture     Status: Abnormal   Collection Time: 01/17/20  9:30 PM   Specimen: Urine, Random  Result Value Ref Range Status   Specimen Description   Final    URINE, RANDOM Performed at [place] [address], [place], [place] [address]    Special Requests   Final    NONE Performed at [facility], [place] [address], [place], [place] [address]    Culture >=100,000 COLONIES/mL ESCHERICHIA COLI (A)  Final   Report Status 01/20/2020 FINAL  Final   Organism ID, Bacteria ESCHERICHIA COLI (A)  Final      Susceptibility   Escherichia coli - MIC*    AMPICILLIN 8 SENSITIVE Sensitive     CEFAZOLIN <=4 SENSITIVE Sensitive     CEFTRIAXONE <=0.25 SENSITIVE Sensitive     CIPROFLOXACIN <=0.25 SENSITIVE Sensitive     GENTAMICIN <=1 SENSITIVE Sensitive     IMIPENEM <=0.25 SENSITIVE Sensitive     NITROFURANTOIN 32 SENSITIVE Sensitive     TRIMETH/SULFA <=20 SENSITIVE Sensitive     AMPICILLIN/SULBACTAM 4 SENSITIVE Sensitive     PIP/TAZO <=4 SENSITIVE Sensitive     * >=100,000 COLONIES/mL ESCHERICHIA COLI  MRSA PCR Screening     Status: None  Collection Time: 01/17/20 11:33 PM    Specimen: Nasal Mucosa; Nasopharyngeal  Result Value Ref Range Status   MRSA by PCR NEGATIVE NEGATIVE Final    Comment:        The GeneXpert MRSA Assay (FDA approved for NASAL specimens only), is one component of a comprehensive MRSA colonization surveillance program. It is not intended to diagnose MRSA infection nor to guide or monitor treatment for MRSA infections. Performed at [facility], [place] [address], [place], [place] [address]     Radiology Reports DG Chest 2 View  Result Date: 01/17/2020 CLINICAL DATA:  Shortness of breath EXAM: CHEST - 2 VIEW COMPARISON:  None. FINDINGS: The cardiomediastinal silhouette is mildly enlarged in contour.Status post TAVR. Atherosclerotic calcifications the aorta. Tortuous thoracic aorta. Hiatal hernia. Elevation the RIGHT hemidiaphragm. Diffuse reticular prominence. Mild peribronchial cuffing. No pleural effusion. No pneumothorax. LEFT basilar linear opacities, likely atelectasis. Surgical clips of the RIGHT axilla. Visualized abdomen is unremarkable. Multilevel degenerative changes of the thoracic spine. IMPRESSION: 1. Constellation of findings are favored to reflect pulmonary edema scattered areas of atelectasis. Atypical infection could present similarly. Electronically Signed   By: Valentino Saxon MD   On: 01/17/2020 15:26   CT Angio Chest PE W/Cm &/Or Wo Cm  Result Date: 01/17/2020 CLINICAL DATA:  Chest pain, shortness of breath EXAM: CT ANGIOGRAPHY CHEST WITH CONTRAST TECHNIQUE: Multidetector CT imaging of the chest was performed using the standard protocol during bolus administration of intravenous contrast. Multiplanar CT image reconstructions and MIPs were obtained to evaluate the vascular anatomy. CONTRAST:  144mL OMNIPAQUE IOHEXOL 350 MG/ML SOLN COMPARISON:  None. FINDINGS: Cardiovascular: Satisfactory opacification of the pulmonary arteries to the lobar level and some proximal segmental arteries. Evaluation is more  limited at the lung bases. No evidence of pulmonary embolism. Cardiomegaly. Aortic valve replacement. No pericardial effusion. Thoracic aorta calcification. Coronary artery calcification. Mediastinum/Nodes: Nonspecific mildly enlarged right suprahilar lymph node. Esophagus is unremarkable. Lungs/Pleura: Imaging is in expiration. Patchy atelectasis is present. Probable scarring at the lung bases. Nonspecific peribronchial thickening. No pleural effusion or pneumothorax. Upper Abdomen: No acute abnormality. Musculoskeletal: No acute osseous abnormality. No significant chest wall finding. Review of the MIP images confirms the above findings. IMPRESSION: No evidence of acute pulmonary embolism within the above limitation. Imaging in expiration with patchy atelectasis. Nonspecific peribronchial thickening. Cardiomegaly. Aortic Atherosclerosis (ICD10-I70.0). Electronically Signed   By: Macy Mis M.D.   On: 01/17/2020 18:15   DG CHEST PORT 1 VIEW  Result Date: 01/18/2020 CLINICAL DATA:  86 year old female with history of dyspnea. EXAM: PORTABLE CHEST 1 VIEW COMPARISON:  Chest x-ray 01/17/2020. FINDINGS: Lung volumes are low. Scattered areas of interstitial prominence, similar to prior examinations. No confluent consolidative airspace disease. No pleural effusions. No evidence of pulmonary edema. No pneumothorax. Heart size is normal. The patient is rotated to the left on today's exam, resulting in distortion of the mediastinal contours and reduced diagnostic sensitivity and specificity for mediastinal pathology. Aortic atherosclerosis. Status post TAVR with Edwards Sapien valve projecting over the aortic root. Surgical clips project over the right axillary region from prior lymph node dissection. IMPRESSION: 1. Low lung volumes without radiographic evidence of acute cardiopulmonary disease. 2. Aortic atherosclerosis. 3. Postoperative changes, as above. Electronically Signed   By: Vinnie Langton M.D.   On:  01/18/2020 11:04   DG Swallowing Func-Speech Pathology  Result Date: 01/20/2020 Objective Swallowing Evaluation: Type of Study: MBS-Modified Barium Swallow Study  Patient Details Name: Betty Chan MRN: [id number] Date of Birth:  1929-11-07 Today's Date: 01/20/2020 Time: SLP Start Time (ACUTE ONLY): 1205 -SLP Stop Time (ACUTE ONLY): 1239 SLP Time Calculation (min) (ACUTE ONLY): 34 min Past Medical History: Past Medical History: Diagnosis Date . Aortic stenosis   s/p TAVR 08/2013 -follows at [facility] cards [facility]) . Basal cell carcinoma  . Cancer of right breast [facility]) 2004  lumpectomy . Dyslipidemia  . GERD (gastroesophageal reflux disease)  . Heart murmur  . Hypertension  . Hypothyroid  . OA (osteoarthritis) of knee   "right" (11/24/2014) . Osteopenia 02/13/2014  DEXA 01/2014: -2.2 . PHN (postherpetic neuralgia)   L anterior thigh 2007, recurrent 04/2013 . Seizure ([place]) 2007  single event, off Keppra since 11/2013; "not sure if it was a TIA or seizure" . Squamous cell cancer of skin of forearm  . TIA (transient ischemic attack) 2007  "not sure if it was a TIA or seizure" Past Surgical History: Past Surgical History: Procedure Laterality Date . BASAL CELL CARCINOMA EXCISION    sternum . BREAST BIOPSY Right 2004 . BREAST LUMPECTOMY Right 08/2002 X 2 . CARDIAC CATHETERIZATION  "several" . CARDIAC VALVE REPLACEMENT   . CATARACT EXTRACTION W/ INTRAOCULAR LENS  IMPLANT, BILATERAL Bilateral 03/2014-07/2014 . SQUAMOUS CELL CARCINOMA EXCISION Left 06/2014  'forearm" . TONSILLECTOMY   . TRANSCATHETER AORTIC VALVE REPLACEMENT, TRANSFEMORAL  09/06/13  [facility] HPI: 86yo female admitted 01/17/20 with SOB, cough, BLE edema - suspect CHF exacerbation. PMH: severe aortic stenosis s/p TAVR (2015) breast cancer, HTN, HLD, hypothyroidism, GERD, OA, osteopenia, seizure, TIA (2007). CXR = no evidence of acute disease.  Pt has h/o PSP vs Parkinsonism gleaned from prior MBS 2019 report.  Her daughter reports pt's voice and cough has been weak  chronically.  Pt has good appetite PTA and does not cough with intake per family.  Daughter also reports pt has a decayed tooth that was diagnosed aprox 3 weeks ago - plan is for extraction in the future. Last CXR showed low lung volumes.  Subjective: pt awake in bed Assessment / Plan / Recommendation CHL IP CLINICAL IMPRESSIONS 01/20/2020 Clinical Impression MBS completed, Full report to follow.  Patient continues with mild oropharyngeal dysphagia with sensorimotor impairments with mild worsening compared to evaluation in 06/2017.  Mild decreased oral control with oral retention spilling into pharynx.  Residuals of liquids in oral cavity were swallowed but with delay and allowed intermittent aspiration of thin. Although aspiration was trace, it did not clear with cued cough.   Delayed pharyngeal swallow initiation, anterior position of epiglottis and decreased laryngeal elevation allows laryngeal penetration of thin/nectar and mild aspiration of thin liquids.  Chin tuck worsened laryngeal penetration.   Laying pt back to 45* compensated for decreased laryngeal closure and prevented penetration as it kept bolus posterior in pharynx.    No aspiration with nectar, pudding, cracker boluses.  No pharyngeal retention of solid or puree.   Pt did not transit tablet with pudding with 2 attempts, cued to expectorate.  Upon esophageal sweep, pt appeared with some residuals without awareness, residuals appeared with lighter shading ? secretions. Thin barium swallows appeared to help clear. Radiologist not present to confirm.     With family, pt agreeing to known chronicity of aspiration risk, recommend dys3/thin.  Medicine with puree - crushed with puree if large.  Will follow up for dysphagia management.  Recommend HOB lowered to 45* for po and remain in position for at least 30 minutes after meals.  Follow solids with liquids. SLP Visit Diagnosis Dysphagia, oropharyngeal phase (R13.12) Attention and  concentration deficit  following -- Frontal lobe and executive function deficit following -- Impact on safety and function Moderate aspiration risk   CHL IP TREATMENT RECOMMENDATION 01/20/2020 Treatment Recommendations Therapy as outlined in treatment plan below   Prognosis 01/20/2020 Prognosis for Safe Diet Advancement Fair Barriers to Reach Goals -- Barriers/Prognosis Comment -- CHL IP DIET RECOMMENDATION 01/20/2020 SLP Diet Recommendations Dysphagia 3 (Mech soft) solids;Thin liquid Liquid Administration via Cup;Straw Medication Administration Whole meds with liquid Compensations Slow rate;Small sips/bites Postural Changes --   CHL IP OTHER RECOMMENDATIONS 01/20/2020 Recommended Consults -- Oral Care Recommendations Oral care QID Other Recommendations Have oral suction available   CHL IP FOLLOW UP RECOMMENDATIONS 01/20/2020 Follow up Recommendations 24 hour supervision/assistance   CHL IP FREQUENCY AND DURATION 01/20/2020 Speech Therapy Frequency (ACUTE ONLY) min 2x/week Treatment Duration 1 week;2 weeks      CHL IP ORAL PHASE 01/20/2020 Oral Phase Impaired Oral - Pudding Teaspoon -- Oral - Pudding Cup -- Oral - Honey Teaspoon -- Oral - Honey Cup -- Oral - Nectar Teaspoon -- Oral - Nectar Cup Weak lingual manipulation Oral - Nectar Straw Lingual/palatal residue;Weak lingual manipulation Oral - Thin Teaspoon Weak lingual manipulation Oral - Thin Cup Weak lingual manipulation;Lingual/palatal residue Oral - Thin Straw Weak lingual manipulation;Lingual/palatal residue Oral - Puree Reduced posterior propulsion;Lingual/palatal residue Oral - Mech Soft Reduced posterior propulsion;Impaired mastication Oral - Regular -- Oral - Multi-Consistency -- Oral - Pill Other (Comment);Decreased bolus cohesion;Delayed oral transit;Weak lingual manipulation Oral Phase - Comment --  CHL IP PHARYNGEAL PHASE 01/20/2020 Pharyngeal Phase Impaired Pharyngeal- Pudding Teaspoon -- Pharyngeal -- Pharyngeal- Pudding Cup -- Pharyngeal -- Pharyngeal- Honey Teaspoon --  Pharyngeal -- Pharyngeal- Honey Cup -- Pharyngeal -- Pharyngeal- Nectar Teaspoon -- Pharyngeal -- Pharyngeal- Nectar Cup Delayed swallow initiation-vallecula;Penetration/Aspiration during swallow;Reduced airway/laryngeal closure Pharyngeal Material enters airway, remains ABOVE vocal cords then ejected out Pharyngeal- Nectar Straw Penetration/Aspiration during swallow;Reduced airway/laryngeal closure Pharyngeal Material enters airway, remains ABOVE vocal cords and not ejected out Pharyngeal- Thin Teaspoon -- Pharyngeal -- Pharyngeal- Thin Cup Reduced airway/laryngeal closure;Reduced laryngeal elevation;Pharyngeal residue - valleculae;Penetration/Aspiration during swallow Pharyngeal Material enters airway, passes BELOW cords without attempt by patient to eject out (silent aspiration) Pharyngeal- Thin Straw Compensatory strategies attempted (with notebox);Reduced laryngeal elevation;Reduced airway/laryngeal closure Pharyngeal Material enters airway, passes BELOW cords without attempt by patient to eject out (silent aspiration) Pharyngeal- Puree WFL Pharyngeal Material does not enter airway Pharyngeal- Mechanical Soft WFL Pharyngeal Material does not enter airway Pharyngeal- Regular -- Pharyngeal -- Pharyngeal- Multi-consistency -- Pharyngeal -- Pharyngeal- Pill NT Pharyngeal -- Pharyngeal Comment pt with TRACE laryngeal penetration of thin with HOB lowered to 45*- trace penetration x1 of  boluses with HOB lowered  CHL IP CERVICAL ESOPHAGEAL PHASE 01/20/2020 Cervical Esophageal Phase Impaired Pudding Teaspoon -- Pudding Cup -- Honey Teaspoon -- Honey Cup -- Nectar Teaspoon -- Nectar Cup -- Nectar Straw -- Thin Teaspoon -- Thin Cup -- Thin Straw -- Puree -- Mechanical Soft -- Regular -- Multi-consistency -- Pill -- Cervical Esophageal Comment Pt did not transit tablet with pudding with 2 attempts, cued to expectorate.  Upon esophageal sweep, pt appeared with some residuals without awareness, residuals appeared with  lighter shading ? secretions. Thin barium swallows appeared to help clear. Radiologist not present to confirm. Betty Lime, MS [facility] SLP Acute Rehab Services Office [phone] Betty Chan 01/20/2020, 2:06 PM              ECHOCARDIOGRAM COMPLETE  Result Date: 01/19/2020    ECHOCARDIOGRAM REPORT   Patient  Name:   Betty Chan Date of Exam: 01/18/2020 Medical Rec #:  [id number]        Height:       67.0 in Accession #:    [id number]       Weight:       169.3 lb Date of Birth:  1930-01-02       BSA:          1.884 m Patient Age:    33 years         BP:           123/50 mmHg Patient Gender: F                HR:           128 bpm. Exam Location:  Inpatient Procedure: 2D Echo, Cardiac Doppler and Color Doppler Indications:    I50.23 Acute on chronic systolic (congestive) heart failure  History:        Patient has no prior history of Echocardiogram examinations.                 TIA, Aortic Valve Disease, Signs/Symptoms:Murmur; Risk                 Factors:Hypertension and Dyslipidemia. Hypothyroidism. Seizures.                 Cancer. GERD.                 Aortic Valve: 29 mm Edwards Sapien prosthetic, stented (TAVR)                 valve is present in the aortic position. Procedure Date: March                 2015.  Sonographer:    Jonelle Sidle Dance Referring Phys: [id number] [place]  1. Patient was tachycardic in the 120s throughout the study.  2. Compared with the echo report 06/6604, systolic function is worse. Left ventricular ejection fraction, by estimation, is 25 to 30%. The left ventricle has severely decreased function. The left ventricle demonstrates global hypokinesis. There is mild concentric left ventricular hypertrophy. Left ventricular diastolic parameters are indeterminate.  3. Right ventricular systolic function is normal. The right ventricular size is normal.  4. Left atrial size was mildly dilated.  5. The mitral valve is normal in structure. Trivial mitral valve regurgitation. No  evidence of mitral stenosis.  6. The aortic valve has been repaired/replaced. Aortic valve regurgitation is not visualized. No aortic stenosis is present. There is a 29 mm Edwards Sapien prosthetic (TAVR) valve present in the aortic position. Procedure Date: March 2015. Echo findings are consistent with normal structure and function of the aortic valve prosthesis. Aortic valve area, by VTI measures 1.33 cm. Aortic valve mean gradient measures 10.0 mmHg. Aortic valve Vmax measures 1.92 m/s.  7. Aortic dilatation noted. There is mild dilatation of the ascending aorta measuring 37 mm.  8. The inferior vena cava is normal in size with greater than 50% respiratory variability, suggesting right atrial pressure of 3 mmHg. FINDINGS  Left Ventricle: Compared with the echo report 08/158, systolic function is worse. Left ventricular ejection fraction, by estimation, is 25 to 30%. The left ventricle has severely decreased function. The left ventricle demonstrates global hypokinesis. The left ventricular internal cavity size was normal in size. There is mild concentric left ventricular hypertrophy. Left ventricular diastolic parameters are indeterminate. Right Ventricle: The right ventricular size is normal. No increase in right  ventricular wall thickness. Right ventricular systolic function is normal. Left Atrium: Left atrial size was mildly dilated. Right Atrium: Right atrial size was normal in size. Pericardium: There is no evidence of pericardial effusion. Mitral Valve: Mitral valve leaflet tips appear to be attached as though she had a Mitraclip or Alfieri stitch. The mitral valve is normal in structure. Normal mobility of the mitral valve leaflets. Mild mitral annular calcification. Trivial mitral valve regurgitation. No evidence of mitral valve stenosis. Tricuspid Valve: The tricuspid valve is normal in structure. Tricuspid valve regurgitation is not demonstrated. No evidence of tricuspid stenosis. Aortic Valve: The  aortic valve has been repaired/replaced. Aortic valve regurgitation is not visualized. No aortic stenosis is present. Aortic valve mean gradient measures 10.0 mmHg. Aortic valve peak gradient measures 14.7 mmHg. Aortic valve area, by VTI measures 1.33 cm. There is a 29 mm Edwards Sapien prosthetic, stented (TAVR) valve present in the aortic position. Procedure Date: March 2015. Echo findings are consistent with normal structure and function of the aortic valve prosthesis. Pulmonic Valve: The pulmonic valve was normal in structure. Pulmonic valve regurgitation is not visualized. No evidence of pulmonic stenosis. Aorta: Aortic dilatation noted. There is mild dilatation of the ascending aorta measuring 37 mm. Venous: The inferior vena cava is normal in size with greater than 50% respiratory variability, suggesting right atrial pressure of 3 mmHg. IAS/Shunts: No atrial level shunt detected by color flow Doppler.  LEFT VENTRICLE PLAX 2D LVIDd:         5.10 cm LVIDs:         4.50 cm LV PW:         1.10 cm LV IVS:        1.30 cm LVOT diam:     1.90 cm LV SV:         41 LV SV Index:   22 LVOT Area:     2.84 cm  RIGHT VENTRICLE          IVC RV Basal diam:  2.50 cm  IVC diam: 1.50 cm TAPSE (M-mode): 1.2 cm LEFT ATRIUM             Index       RIGHT ATRIUM           Index LA diam:        4.60 cm 2.44 cm/m  RA Area:     12.20 cm LA Vol (A2C):   73.4 ml 38.96 ml/m RA Volume:   25.10 ml  13.32 ml/m LA Vol (A4C):   36.9 ml 19.59 ml/m LA Biplane Vol: 53.9 ml 28.61 ml/m  AORTIC VALVE AV Area (Vmax):    1.31 cm AV Area (Vmean):   1.16 cm AV Area (VTI):     1.33 cm AV Vmax:           192.00 cm/s AV Vmean:          147.000 cm/s AV VTI:            0.308 m AV Peak Grad:      14.7 mmHg AV Mean Grad:      10.0 mmHg LVOT Vmax:         88.65 cm/s LVOT Vmean:        60.050 cm/s LVOT VTI:          0.144 m LVOT/AV VTI ratio: 0.47  AORTA Ao Root diam: 3.30 cm Ao Asc diam:  3.70 cm MITRAL VALVE MV Area (PHT): 4.89 cm     SHUNTS MV  Decel Time: 155 msec     Systemic VTI:  0.14 m MV E velocity: 112.50 cm/s  Systemic Diam: 1.90 cm Betty Latch MD Electronically signed by Betty Latch MD Signature Date/Time: 01/19/2020/6:30:29 AM    Final     Lab Data:  CBC: Recent Labs  Lab 01/17/20 1500 01/19/20 0421  WBC 5.3 9.3  NEUTROABS 3.8  --   HGB 12.1 12.3  HCT 38.3 37.2  MCV 99.5 94.7  PLT 215 681   Basic Metabolic Panel: Recent Labs  Lab 01/17/20 1500 01/18/20 0429 01/19/20 0421 01/20/20 0440 01/21/20 0453  NA 136 135 136 134* 138  K 4.2 3.9 3.4* 3.4* 3.2*  CL 101 98 95* 96* 99  CO2 27 28 29 26 27   GLUCOSE 82 116* 107* 109* 124*  BUN 29* 22 27* 33* 38*  CREATININE 0.80 0.87 1.02* 1.10* 1.04*  CALCIUM 8.8* 8.6* 8.9 8.8* 8.8*  MG  --  1.8  --   --   --    GFR: Estimated Creatinine Clearance: 40.1 mL/min (A) (by C-G formula based on SCr of 1.04 mg/dL (H)). Liver Function Tests: Recent Labs  Lab 01/17/20 1500  AST 28  ALT 26  ALKPHOS 56  BILITOT 0.7  PROT 7.6  ALBUMIN 3.9   No results for input(s): LIPASE, AMYLASE in the last 168 hours. No results for input(s): AMMONIA in the last 168 hours. Coagulation Profile: No results for input(s): INR, PROTIME in the last 168 hours. Cardiac Enzymes: No results for input(s): CKTOTAL, CKMB, CKMBINDEX, TROPONINI in the last 168 hours. BNP (last 3 results) No results for input(s): PROBNP in the last 8760 hours. HbA1C: No results for input(s): HGBA1C in the last 72 hours. CBG: No results for input(s): GLUCAP in the last 168 hours. Lipid Profile: No results for input(s): CHOL, HDL, LDLCALC, TRIG, CHOLHDL, LDLDIRECT in the last 72 hours. Thyroid Function Tests: No results for input(s): TSH, T4TOTAL, FREET4, T3FREE, THYROIDAB in the last 72 hours. Anemia Panel: No results for input(s): VITAMINB12, FOLATE, FERRITIN, TIBC, IRON, RETICCTPCT in the last 72 hours. Urine analysis:    Component Value Date/Time   COLORURINE YELLOW 01/17/2020 2130    APPEARANCEUR HAZY (A) 01/17/2020 2130   LABSPEC 1.010 01/17/2020 2130   PHURINE 5.5 01/17/2020 2130   GLUCOSEU NEGATIVE 01/17/2020 2130   HGBUR TRACE (A) 01/17/2020 2130   BILIRUBINUR NEGATIVE 01/17/2020 2130   KETONESUR NEGATIVE 01/17/2020 2130   PROTEINUR NEGATIVE 01/17/2020 2130   UROBILINOGEN 1.0 11/25/2014 0220   NITRITE POSITIVE (A) 01/17/2020 2130   LEUKOCYTESUR SMALL (A) 01/17/2020 2130     Malone Vanblarcom M.D. [facility] 01/21/2020, 1:17 PM   Call night coverage person covering after 7pm

## 2020-01-21 NOTE — Progress Notes (Signed)
Progress Note  Patient Name: Betty Chan Date of Encounter: 01/21/2020  [facility] Cardiologist: No primary care provider on file.  Betty Chan  Subjective   86 year old with severe aortic stenosis status post TAVR in 2015 with breast cancer status post lumpectomy presented with shortness of breath.  5-day history of dyspnea worse with exertion.  Ankle swollen. Pulmonary edema versus atypical infection, negative CT for PE.  BNP 504.  EF on echocardiogram showed severe EF 25 to 30%  Overall no complaints.  Resting comfortably.  Inpatient Medications    Scheduled Meds: . atorvastatin  20 mg Oral Daily  . enoxaparin (LOVENOX) injection  40 mg Subcutaneous Q24H  . furosemide  40 mg Oral Daily  . guaiFENesin  600 mg Oral BID  . levothyroxine  50 mcg Oral QAC breakfast  . lisinopril  5 mg Oral Daily  . mouth rinse  15 mL Mouth Rinse BID  . metoprolol succinate  37.5 mg Oral BID  . mirabegron ER  50 mg Oral Daily  . pantoprazole  40 mg Oral Daily  . potassium chloride  20 mEq Oral TID  . sodium chloride flush  3 mL Intravenous Q12H   Continuous Infusions: . sodium chloride    . cefTRIAXone (ROCEPHIN)  IV Stopped (01/20/20 2012)  . metronidazole 500 mg (01/21/20 0840)  . vancomycin Stopped (01/21/20 0618)   PRN Meds: sodium chloride, acetaminophen, hydrALAZINE, levalbuterol, sodium chloride flush   Vital Signs    Vitals:   01/20/20 1312 01/20/20 2022 01/21/20 0501 01/21/20 0621  BP: (!) 136/97 121/67 (!) 134/70   Pulse: 74 84 70   Resp: 19 19 14    Temp: 98.6 F (37 C) 99.2 F (37.3 C) 98.7 F (37.1 C)   TempSrc: Oral Oral Oral   SpO2: 99% 97% 96%   Weight:    74.4 kg  Height:        Intake/Output Summary (Last 24 hours) at 01/21/2020 0856 Last data filed at 01/21/2020 0500 Gross per 24 hour  Intake 700 ml  Output --  Net 700 ml   Last 3 Weights 01/21/2020 01/18/2020 01/17/2020  Weight (lbs) 164 lb 0.4 oz 169 lb 5 oz 171 lb 15.3 oz  Weight (kg) 74.4  kg 76.8 kg 78 kg      Telemetry    Sinus rhythm, PVCs noted bigeminy, trigeminy- Personally Reviewed   Physical Exam   GEN: No acute distress.  Elderly, laying in bed, head cocked to the side.  Arousable. Neck: No JVD Cardiac: RRR, 2/6 systolic murmur, no rubs, or gallops.  Occasional ectopy Respiratory: Clear to auscultation bilaterally. GI: Soft, nontender, non-distended  MS: No edema; No deformity. Neuro:  Nonfocal  Psych: Normal affect   Labs    High Sensitivity Troponin:   Recent Labs  Lab 01/17/20 1500 01/17/20 1719  TROPONINIHS 18* 20*      Chemistry Recent Labs  Lab 01/17/20 1500 01/18/20 0429 01/19/20 0421 01/20/20 0440 01/21/20 0453  NA 136   < > 136 134* 138  K 4.2   < > 3.4* 3.4* 3.2*  CL 101   < > 95* 96* 99  CO2 27   < > 29 26 27   GLUCOSE 82   < > 107* 109* 124*  BUN 29*   < > 27* 33* 38*  CREATININE 0.80   < > 1.02* 1.10* 1.04*  CALCIUM 8.8*   < > 8.9 8.8* 8.8*  PROT 7.6  --   --   --   --  ALBUMIN 3.9  --   --   --   --   AST 28  --   --   --   --   ALT 26  --   --   --   --   ALKPHOS 56  --   --   --   --   BILITOT 0.7  --   --   --   --   GFRNONAA >60   < > 49* 45* 48*  GFRAA >60   < > 57* 52* 56*  ANIONGAP 8   < > 12 12 12    < > = values in this interval not displayed.     Hematology Recent Labs  Lab 01/17/20 1500 01/19/20 0421  WBC 5.3 9.3  RBC 3.85* 3.93  HGB 12.1 12.3  HCT 38.3 37.2  MCV 99.5 94.7  MCH 31.4 31.3  MCHC 31.6 33.1  RDW 13.8 13.5  PLT 215 225    BNP Recent Labs  Lab 01/17/20 1547  BNP 504.5*     DDimer No results for input(s): DDIMER in the last 168 hours.   Radiology    DG Swallowing Func-Speech Pathology  Result Date: 01/20/2020 Objective Swallowing Evaluation: Type of Study: MBS-Modified Barium Swallow Study  Patient Details Name: Betty Chan MRN: [id number] Date of Birth: [birth year] Today's Date: 01/20/2020 Time: SLP Start Time (ACUTE ONLY): 1205 -SLP Stop Time (ACUTE ONLY): 1239 SLP Time  Calculation (min) (ACUTE ONLY): 34 min Past Medical History: Past Medical History: Diagnosis Date . Aortic stenosis   s/p TAVR 08/2013 -follows at [facility] cards Betty Chan) . Basal cell carcinoma  . Cancer of right breast [facility]) 2004  lumpectomy . Dyslipidemia  . GERD (gastroesophageal reflux disease)  . Heart murmur  . Hypertension  . Hypothyroid  . OA (osteoarthritis) of knee   "right" (11/24/2014) . Osteopenia 02/13/2014  DEXA 01/2014: -2.2 . PHN (postherpetic neuralgia)   L anterior thigh 2007, recurrent 04/2013 . Seizure ([place]) 2007  single event, off Keppra since 11/2013; "not sure if it was a TIA or seizure" . Squamous cell cancer of skin of forearm  . TIA (transient ischemic attack) 2007  "not sure if it was a TIA or seizure" Past Surgical History: Past Surgical History: Procedure Laterality Date . BASAL CELL CARCINOMA EXCISION    sternum . BREAST BIOPSY Right 2004 . BREAST LUMPECTOMY Right 08/2002 X 2 . CARDIAC CATHETERIZATION  "several" . CARDIAC VALVE REPLACEMENT   . CATARACT EXTRACTION W/ INTRAOCULAR LENS  IMPLANT, BILATERAL Bilateral 03/2014-07/2014 . SQUAMOUS CELL CARCINOMA EXCISION Left 06/2014  'forearm" . TONSILLECTOMY   . TRANSCATHETER AORTIC VALVE REPLACEMENT, TRANSFEMORAL  09/06/13  [facility] HPI: 86yo female admitted 01/17/20 with SOB, cough, BLE edema - suspect CHF exacerbation. PMH: severe aortic stenosis s/p TAVR (2015) breast cancer, HTN, HLD, hypothyroidism, GERD, OA, osteopenia, seizure, TIA (2007). CXR = no evidence of acute disease.  Pt has h/o PSP vs Parkinsonism gleaned from prior MBS 2019 report.  Her daughter reports pt's voice and cough has been weak chronically.  Pt has good appetite PTA and does not cough with intake per family.  Daughter also reports pt has a decayed tooth that was diagnosed aprox 3 weeks ago - plan is for extraction in the future. Last CXR showed low lung volumes.  Subjective: pt awake in bed Assessment / Plan / Recommendation CHL IP CLINICAL IMPRESSIONS 01/20/2020 Clinical Impression MBS  completed, Full report to follow.  Patient continues with mild oropharyngeal dysphagia with sensorimotor impairments with mild  worsening compared to evaluation in 06/2017.  Mild decreased oral control with oral retention spilling into pharynx.  Residuals of liquids in oral cavity were swallowed but with delay and allowed intermittent aspiration of thin. Although aspiration was trace, it did not clear with cued cough.   Delayed pharyngeal swallow initiation, anterior position of epiglottis and decreased laryngeal elevation allows laryngeal penetration of thin/nectar and mild aspiration of thin liquids.  Chin tuck worsened laryngeal penetration.   Laying pt back to 45* compensated for decreased laryngeal closure and prevented penetration as it kept bolus posterior in pharynx.    No aspiration with nectar, pudding, cracker boluses.  No pharyngeal retention of solid or puree.   Pt did not transit tablet with pudding with 2 attempts, cued to expectorate.  Upon esophageal sweep, pt appeared with some residuals without awareness, residuals appeared with lighter shading ? secretions. Thin barium swallows appeared to help clear. Radiologist not present to confirm.     With family, pt agreeing to known chronicity of aspiration risk, recommend dys3/thin.  Medicine with puree - crushed with puree if large.  Will follow up for dysphagia management.  Recommend HOB lowered to 45* for po and remain in position for at least 30 minutes after meals.  Follow solids with liquids. SLP Visit Diagnosis Dysphagia, oropharyngeal phase (R13.12) Attention and concentration deficit following -- Frontal lobe and executive function deficit following -- Impact on safety and function Moderate aspiration risk   CHL IP TREATMENT RECOMMENDATION 01/20/2020 Treatment Recommendations Therapy as outlined in treatment plan below   Prognosis 01/20/2020 Prognosis for Safe Diet Advancement Fair Barriers to Reach Goals -- Barriers/Prognosis Comment -- CHL IP DIET  RECOMMENDATION 01/20/2020 SLP Diet Recommendations Dysphagia 3 (Mech soft) solids;Thin liquid Liquid Administration via Cup;Straw Medication Administration Whole meds with liquid Compensations Slow rate;Small sips/bites Postural Changes --   CHL IP OTHER RECOMMENDATIONS 01/20/2020 Recommended Consults -- Oral Care Recommendations Oral care QID Other Recommendations Have oral suction available   CHL IP FOLLOW UP RECOMMENDATIONS 01/20/2020 Follow up Recommendations 24 hour supervision/assistance   CHL IP FREQUENCY AND DURATION 01/20/2020 Speech Therapy Frequency (ACUTE ONLY) min 2x/week Treatment Duration 1 week;2 weeks      CHL IP ORAL PHASE 01/20/2020 Oral Phase Impaired Oral - Pudding Teaspoon -- Oral - Pudding Cup -- Oral - Honey Teaspoon -- Oral - Honey Cup -- Oral - Nectar Teaspoon -- Oral - Nectar Cup Weak lingual manipulation Oral - Nectar Straw Lingual/palatal residue;Weak lingual manipulation Oral - Thin Teaspoon Weak lingual manipulation Oral - Thin Cup Weak lingual manipulation;Lingual/palatal residue Oral - Thin Straw Weak lingual manipulation;Lingual/palatal residue Oral - Puree Reduced posterior propulsion;Lingual/palatal residue Oral - Mech Soft Reduced posterior propulsion;Impaired mastication Oral - Regular -- Oral - Multi-Consistency -- Oral - Pill Other (Comment);Decreased bolus cohesion;Delayed oral transit;Weak lingual manipulation Oral Phase - Comment --  CHL IP PHARYNGEAL PHASE 01/20/2020 Pharyngeal Phase Impaired Pharyngeal- Pudding Teaspoon -- Pharyngeal -- Pharyngeal- Pudding Cup -- Pharyngeal -- Pharyngeal- Honey Teaspoon -- Pharyngeal -- Pharyngeal- Honey Cup -- Pharyngeal -- Pharyngeal- Nectar Teaspoon -- Pharyngeal -- Pharyngeal- Nectar Cup Delayed swallow initiation-vallecula;Penetration/Aspiration during swallow;Reduced airway/laryngeal closure Pharyngeal Material enters airway, remains ABOVE vocal cords then ejected out Pharyngeal- Nectar Straw Penetration/Aspiration during swallow;Reduced  airway/laryngeal closure Pharyngeal Material enters airway, remains ABOVE vocal cords and not ejected out Pharyngeal- Thin Teaspoon -- Pharyngeal -- Pharyngeal- Thin Cup Reduced airway/laryngeal closure;Reduced laryngeal elevation;Pharyngeal residue - valleculae;Penetration/Aspiration during swallow Pharyngeal Material enters airway, passes BELOW cords without attempt by patient to eject out (silent  aspiration) Pharyngeal- Thin Straw Compensatory strategies attempted (with notebox);Reduced laryngeal elevation;Reduced airway/laryngeal closure Pharyngeal Material enters airway, passes BELOW cords without attempt by patient to eject out (silent aspiration) Pharyngeal- Puree WFL Pharyngeal Material does not enter airway Pharyngeal- Mechanical Soft WFL Pharyngeal Material does not enter airway Pharyngeal- Regular -- Pharyngeal -- Pharyngeal- Multi-consistency -- Pharyngeal -- Pharyngeal- Pill NT Pharyngeal -- Pharyngeal Comment pt with TRACE laryngeal penetration of thin with HOB lowered to 45*- trace penetration x1 of  boluses with HOB lowered  CHL IP CERVICAL ESOPHAGEAL PHASE 01/20/2020 Cervical Esophageal Phase Impaired Pudding Teaspoon -- Pudding Cup -- Honey Teaspoon -- Honey Cup -- Nectar Teaspoon -- Nectar Cup -- Nectar Straw -- Thin Teaspoon -- Thin Cup -- Thin Straw -- Puree -- Mechanical Soft -- Regular -- Multi-consistency -- Pill -- Cervical Esophageal Comment Pt did not transit tablet with pudding with 2 attempts, cued to expectorate.  Upon esophageal sweep, pt appeared with some residuals without awareness, residuals appeared with lighter shading ? secretions. Thin barium swallows appeared to help clear. Radiologist not present to confirm. Kathleen Lime, MS [facility] SLP Acute Rehab Services Office [phone] Macario Golds 01/20/2020, 2:06 PM               Assessment & Plan    Acute on chronic systolic heart failure -EF 25 to 30% -Beta-blocker, I will increase to 50 mg twice a day of Toprol-XL.  Currently  on 37.5 twice a day. -Continue with low-dose ACE inhibitor.  Lasix 40 p.o. 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  PVCs seem to have the same morphology vastly. -Cardiac catheterization in 2015 showed no significant CAD.  Aortic stenosis post TAVR -2015.  Stable.  Dysphagia leading to aspiration -Barium swallow.     For questions or updates, please contact [place] Please consult [email or web address] for contact info under        Signed, Candee Furbish, MD  01/21/2020, 8:56 AM

## 2020-01-22 DIAGNOSIS — E7800 Pure hypercholesterolemia, unspecified: Secondary | ICD-10-CM

## 2020-01-22 LAB — MAGNESIUM: Magnesium: 1.9 mg/dL (ref 1.7–2.4)

## 2020-01-22 LAB — BASIC METABOLIC PANEL
Anion gap: 13 (ref 5–15)
BUN: 29 mg/dL — ABNORMAL HIGH (ref 8–23)
CO2: 26 mmol/L (ref 22–32)
Calcium: 9 mg/dL (ref 8.9–10.3)
Chloride: 103 mmol/L (ref 98–111)
Creatinine, Ser: 0.88 mg/dL (ref 0.44–1.00)
GFR calc Af Amer: >60 mL/min (ref >60)
GFR calc non Af Amer: 59 mL/min — ABNORMAL LOW (ref >60)
Glucose, Bld: 146 mg/dL — ABNORMAL HIGH (ref 70–99)
Potassium: 3.9 mmol/L (ref 3.5–5.1)
Sodium: 142 mmol/L (ref 135–145)

## 2020-01-22 LAB — POTASSIUM: Potassium: 4.3 mmol/L (ref 3.5–5.1)

## 2020-01-22 MED ORDER — CAPSAICIN 0.025 % EX CREA
TOPICAL_CREAM | Freq: Three times a day (TID) | CUTANEOUS | Status: DC | PRN
  Administered 2020-01-24 – 2020-01-25 (×2): 1 via TOPICAL
  Filled 2020-01-22: qty 60

## 2020-01-22 NOTE — Progress Notes (Signed)
[facility]                                                                              Patient Demographics  Betty Chan, is a 86 y.o. female, DOB - [birth year], LFY:[id number]  Admit date - 01/17/2020   Admitting Physician Betty Leff, MD  Outpatient Primary MD for the patient is Betty Small, MD  Outpatient specialists:   LOS - 5  days   Medical records reviewed and are as summarized below:    Chief Complaint  Patient presents with  . Shortness of Breath       Brief summary   Patient is 86 year old female with history of severe aortic stenosis status post TAVR in 2015, breast CA status post lumpectomy, hypertension, hyperlipidemia, hypothyroidism, GERD presented with shortness of breath.  Patient reported 5-day history of dyspnea, worse with exertion.  Also coughing and ankles have been swollen.  Denies prior history of COPD or CHF. Chest x-ray showed pulmonary edema versus atypical infection CTA chest negative for PE, showed patchy atelectasis, nonspecific peribronchial thickening and cardiomegaly, BNP elevated 504.  Assessment & Plan    Principal Problem: Acute respiratory failure with hypoxia secondary to acute on chronic systolic and diastolic exacerbation of CHF (congestive heart failure) (HCC) -- History of severe aortic stenosis status post TAVR, echo in 2016 ([facility]) showed EF 85%, grade 1 diastolic dysfunction - 2D echo showed EF of 25 to 30% with global hypokinesis, left ventricular severely decreased function -Patient was placed on IV Lasix, increased to 40 mg q12 hours, now has been transitioned to oral 40 mg daily - cardiology was consulted, cardiac cath in 2015 had showed no significant CAD -Per cardiology, continue with the low-dose ACE inhibitor, Toprol-XL increased to 50 mg twice daily, Lasix 40 mg daily, signed off today  Aspiration pneumonia -Covid negative, however given multiple comorbidities, new respiratory failure with  hypoxia, age, high risk for mortality, palliative medicine consulted for goals of care. Recommendations reviewed, full code.  Patient was placed on IV vancomycin, Rocephin and Flagyl.  Allergic to penicillin. -SLP evaluation done, was placed on dysphagia 3 diet, however appears to have coarse breath sounds today.  Changed to dysphagia 2 diet, will repeat SLP evaluation in a.m.  Elevated troponin -Mildly elevated troponin likely due to #1, no chest pain -Cardiology signed off today, no further cardiac work-up  E. coli UTI -UA showed positive nitrite, leukocytes, many bacteria, > 50 WBC, urine culture showed E. coli -Continue IV Rocephin  Hypothyroidism -Continue Synthroid, TSH 1.6, previously 4.79  Essential hypertension -Continue Lasix, beta-blocker, ACE inhibitor  History of aortic stenosis status post TAVR in 2015 -2D echo as above  Generalized debility, Parkinson's disease  - per daughter, wheel chair bound for last 4-5 years, otherwise cognitively intact, has a hard time vocalizing (whispering is chronic for the patient per daughter) -PT OT evaluation done, recommended return to ALF with 24/7 caregivers    Pressure injury of skin -Right heel stage I, POA   Code Status: Full CODE STATUS DVT Prophylaxis:  Lovenox Family Communication: Discussed all imaging results, lab results, explained to the patient's daughter, Betty Chan   Disposition  Plan:     Status is: Inpatient  Remains inpatient appropriate because:Inpatient level of care appropriate due to severity of illness   Dispo: The patient is from: SNF              Anticipated d/c is to: ALF              Anticipated d/c date is: 2 days              Patient currently is not medically stable to d/c. possibly 24-48 hrs    Time Spent in minutes   10mins   Procedures:  2D echo  Consultants:   Cardiology Palliative  Antimicrobials:   Anti-infectives (From admission, onward)   Start     Dose/Rate Route Frequency Ordered  Stop   01/20/20 1000  vancomycin (VANCOREADY) IVPB 750 mg/150 mL  Status:  Discontinued        750 mg 150 mL/hr over 60 Minutes Intravenous Every 12 hours 01/19/20 1510 01/21/20 1418   01/19/20 1600  metroNIDAZOLE (FLAGYL) IVPB 500 mg     Discontinue     500 mg 100 mL/hr over 60 Minutes Intravenous Every 8 hours 01/19/20 1446     01/19/20 1600  vancomycin (VANCOREADY) IVPB 1500 mg/300 mL        1,500 mg 150 mL/hr over 120 Minutes Intravenous  Once 01/19/20 1510 01/19/20 2348   01/18/20 1200  cefTRIAXone (ROCEPHIN) 1 g in sodium chloride 0.9 % 100 mL IVPB     Discontinue     1 g 200 mL/hr over 30 Minutes Intravenous Every 24 hours 01/18/20 1010           Medications  Scheduled Meds: . atorvastatin  20 mg Oral Daily  . enoxaparin (LOVENOX) injection  40 mg Subcutaneous Q24H  . furosemide  40 mg Oral Daily  . guaiFENesin  600 mg Oral BID  . levothyroxine  50 mcg Oral QAC breakfast  . lisinopril  5 mg Oral Daily  . mouth rinse  15 mL Mouth Rinse BID  . metoprolol succinate  50 mg Oral BID  . mirabegron ER  50 mg Oral Daily  . pantoprazole  40 mg Oral Daily  . potassium chloride  20 mEq Oral TID  . sodium chloride flush  3 mL Intravenous Q12H   Continuous Infusions: . sodium chloride    . cefTRIAXone (ROCEPHIN)  IV 1 g (01/22/20 1145)  . metronidazole 500 mg (01/22/20 0927)   PRN Meds:.sodium chloride, acetaminophen, hydrALAZINE, levalbuterol, sodium chloride flush      Subjective:   Betty Chan was seen and examined today.  Patient much more alert and oriented today, NT feeding her.   Slight coughing noted otherwise no shortness of breath, fever chills or chest pain.  No acute events overnight.  Generalized weakness.  Objective:   Vitals:   01/21/20 1345 01/21/20 2057 01/22/20 0438 01/22/20 0500  BP: (!) 111/62 (!) 129/66 (!) 141/71   Pulse: 66 74 74   Resp: 18 18 18    Temp: 98.8 F (37.1 C) 97.7 F (36.5 C) 98.5 F (36.9 C)   TempSrc:  Oral Oral   SpO2:  95% 96% 95%   Weight:    73.3 kg  Height:        Intake/Output Summary (Last 24 hours) at 01/22/2020 1319 Last data filed at 01/22/2020 0921 Gross per 24 hour  Intake 500 ml  Output 800 ml  Net -300 ml     Wt Readings from Last 3  Encounters:  01/22/20 73.3 kg  08/25/16 74.4 kg  08/18/16 76.2 kg    Physical Exam  General: Alert and oriented x 3, ill-appearing, very deconditioned  Cardiovascular: S1 S2 clear, RRR. trace pedal edema b/l  Respiratory: coarse BS bilaterally   Gastrointestinal: Soft, nontender, nondistended, NBS  Ext: trace pedal edema bilaterally  Neuro: no new deficits  Musculoskeletal: No cyanosis, clubbing  Skin: No rashes  Psych: Pleasant, appears to be much more alert and oriented today     Data Reviewed:  I have personally reviewed following labs and imaging studies  Micro Results Recent Results (from the past 240 hour(s))  SARS Coronavirus 2 by RT PCR (hospital order, performed in [place] hospital lab) Nasopharyngeal Nasopharyngeal Swab     Status: None   Collection Time: 01/17/20  3:12 PM   Specimen: Nasopharyngeal Swab  Result Value Ref Range Status   SARS Coronavirus 2 NEGATIVE NEGATIVE Final    Comment: (NOTE) SARS-CoV-2 target nucleic acids are NOT DETECTED.  The SARS-CoV-2 RNA is generally detectable in upper and lower respiratory specimens during the acute phase of infection. The lowest concentration of SARS-CoV-2 viral copies this assay can detect is 250 copies / mL. A negative result does not preclude SARS-CoV-2 infection and should not be used as the sole basis for treatment or other patient management decisions.  A negative result may occur with improper specimen collection / handling, submission of specimen other than nasopharyngeal swab, presence of viral mutation(s) within the areas targeted by this assay, and inadequate number of viral copies (<250 copies / mL). A negative result must be combined with  clinical observations, patient history, and epidemiological information.  Fact Sheet for Patients:   StrictlyIdeas.no  Fact Sheet for Healthcare Providers: BankingDealers.co.za  This test is not yet approved or  cleared by the [place] FDA and has been authorized for detection and/or diagnosis of SARS-CoV-2 by FDA under an Emergency Use Authorization (EUA).  This EUA will remain in effect (meaning this test can be used) for the duration of the COVID-19 declaration under Section 564(b)(1) of the Act, 21 U.S.C. section 360bbb-3(b)(1), unless the authorization is terminated or revoked sooner.  Performed at [facility], [place] [address] [place], [place] [address]   Urine Culture     Status: Abnormal   Collection Time: 01/17/20  9:30 PM   Specimen: Urine, Random  Result Value Ref Range Status   Specimen Description   Final    URINE, RANDOM Performed at [facility] [address] [place], [place] [address]    Special Requests   Final    NONE Performed at [facility], [place] [address] [place], [place] [address]    Culture >=100,000 COLONIES/mL ESCHERICHIA COLI (A)  Final   Report Status 01/20/2020 FINAL  Final   Organism ID, Bacteria ESCHERICHIA COLI (A)  Final      Susceptibility   Escherichia coli - MIC*    AMPICILLIN 8 SENSITIVE Sensitive     CEFAZOLIN <=4 SENSITIVE Sensitive     CEFTRIAXONE <=0.25 SENSITIVE Sensitive     CIPROFLOXACIN <=0.25 SENSITIVE Sensitive     GENTAMICIN <=1 SENSITIVE Sensitive     IMIPENEM <=0.25 SENSITIVE Sensitive     NITROFURANTOIN 32 SENSITIVE Sensitive     TRIMETH/SULFA <=20 SENSITIVE Sensitive     AMPICILLIN/SULBACTAM 4 SENSITIVE Sensitive     PIP/TAZO <=4 SENSITIVE Sensitive     * >=100,000 COLONIES/mL ESCHERICHIA COLI  MRSA PCR Screening  Status: None   Collection Time: 01/17/20 11:33 PM   Specimen: Nasal Mucosa; Nasopharyngeal   Result Value Ref Range Status   MRSA by PCR NEGATIVE NEGATIVE Final    Comment:        The GeneXpert MRSA Assay (FDA approved for NASAL specimens only), is one component of a comprehensive MRSA colonization surveillance program. It is not intended to diagnose MRSA infection nor to guide or monitor treatment for MRSA infections. Performed at [facility], [place] [address], [place], [place] [address]     Radiology Reports DG Chest 2 View  Result Date: 01/17/2020 CLINICAL DATA:  Shortness of breath EXAM: CHEST - 2 VIEW COMPARISON:  None. FINDINGS: The cardiomediastinal silhouette is mildly enlarged in contour.Status post TAVR. Atherosclerotic calcifications the aorta. Tortuous thoracic aorta. Hiatal hernia. Elevation the RIGHT hemidiaphragm. Diffuse reticular prominence. Mild peribronchial cuffing. No pleural effusion. No pneumothorax. LEFT basilar linear opacities, likely atelectasis. Surgical clips of the RIGHT axilla. Visualized abdomen is unremarkable. Multilevel degenerative changes of the thoracic spine. IMPRESSION: 1. Constellation of findings are favored to reflect pulmonary edema scattered areas of atelectasis. Atypical infection could present similarly. Electronically Signed   By: Valentino Saxon MD   On: 01/17/2020 15:26   CT Angio Chest PE W/Cm &/Or Wo Cm  Result Date: 01/17/2020 CLINICAL DATA:  Chest pain, shortness of breath EXAM: CT ANGIOGRAPHY CHEST WITH CONTRAST TECHNIQUE: Multidetector CT imaging of the chest was performed using the standard protocol during bolus administration of intravenous contrast. Multiplanar CT image reconstructions and MIPs were obtained to evaluate the vascular anatomy. CONTRAST:  147mL OMNIPAQUE IOHEXOL 350 MG/ML SOLN COMPARISON:  None. FINDINGS: Cardiovascular: Satisfactory opacification of the pulmonary arteries to the lobar level and some proximal segmental arteries. Evaluation is more limited at the lung bases. No evidence of  pulmonary embolism. Cardiomegaly. Aortic valve replacement. No pericardial effusion. Thoracic aorta calcification. Coronary artery calcification. Mediastinum/Nodes: Nonspecific mildly enlarged right suprahilar lymph node. Esophagus is unremarkable. Lungs/Pleura: Imaging is in expiration. Patchy atelectasis is present. Probable scarring at the lung bases. Nonspecific peribronchial thickening. No pleural effusion or pneumothorax. Upper Abdomen: No acute abnormality. Musculoskeletal: No acute osseous abnormality. No significant chest wall finding. Review of the MIP images confirms the above findings. IMPRESSION: No evidence of acute pulmonary embolism within the above limitation. Imaging in expiration with patchy atelectasis. Nonspecific peribronchial thickening. Cardiomegaly. Aortic Atherosclerosis (ICD10-I70.0). Electronically Signed   By: Macy Mis M.D.   On: 01/17/2020 18:15   DG CHEST PORT 1 VIEW  Result Date: 01/18/2020 CLINICAL DATA:  86 year old female with history of dyspnea. EXAM: PORTABLE CHEST 1 VIEW COMPARISON:  Chest x-ray 01/17/2020. FINDINGS: Lung volumes are low. Scattered areas of interstitial prominence, similar to prior examinations. No confluent consolidative airspace disease. No pleural effusions. No evidence of pulmonary edema. No pneumothorax. Heart size is normal. The patient is rotated to the left on today's exam, resulting in distortion of the mediastinal contours and reduced diagnostic sensitivity and specificity for mediastinal pathology. Aortic atherosclerosis. Status post TAVR with Edwards Sapien valve projecting over the aortic root. Surgical clips project over the right axillary region from prior lymph node dissection. IMPRESSION: 1. Low lung volumes without radiographic evidence of acute cardiopulmonary disease. 2. Aortic atherosclerosis. 3. Postoperative changes, as above. Electronically Signed   By: Vinnie Langton M.D.   On: 01/18/2020 11:04   DG Swallowing Func-Speech  Pathology  Result Date: 01/20/2020 Objective Swallowing Evaluation: Type of Study: MBS-Modified Barium Swallow Study  Patient Details Name: Kalisi Bevill MRN:  [id number] Date of Birth: [birth year] Today's Date: 01/20/2020 Time: SLP Start Time (ACUTE ONLY): 1205 -SLP Stop Time (ACUTE ONLY): 1239 SLP Time Calculation (min) (ACUTE ONLY): 34 min Past Medical History: Past Medical History: Diagnosis Date . Aortic stenosis   s/p TAVR 08/2013 -follows at [facility]) . Basal cell carcinoma  . Cancer of right breast [facility]) 2004  lumpectomy . Dyslipidemia  . GERD (gastroesophageal reflux disease)  . Heart murmur  . Hypertension  . Hypothyroid  . OA (osteoarthritis) of knee   "right" (11/24/2014) . Osteopenia 02/13/2014  DEXA 01/2014: -2.2 . PHN (postherpetic neuralgia)   L anterior thigh 2007, recurrent 04/2013 . Seizure ([place]) 2007  single event, off Keppra since 11/2013; "not sure if it was a TIA or seizure" . Squamous cell cancer of skin of forearm  . TIA (transient ischemic attack) 2007  "not sure if it was a TIA or seizure" Past Surgical History: Past Surgical History: Procedure Laterality Date . BASAL CELL CARCINOMA EXCISION    sternum . BREAST BIOPSY Right 2004 . BREAST LUMPECTOMY Right 08/2002 X 2 . CARDIAC CATHETERIZATION  "several" . CARDIAC VALVE REPLACEMENT   . CATARACT EXTRACTION W/ INTRAOCULAR LENS  IMPLANT, BILATERAL Bilateral 03/2014-07/2014 . SQUAMOUS CELL CARCINOMA EXCISION Left 06/2014  'forearm" . TONSILLECTOMY   . TRANSCATHETER AORTIC VALVE REPLACEMENT, TRANSFEMORAL  09/06/13  [facility] HPI: 86yo female admitted 01/17/20 with SOB, cough, BLE edema - suspect CHF exacerbation. PMH: severe aortic stenosis s/p TAVR (2015) breast cancer, HTN, HLD, hypothyroidism, GERD, OA, osteopenia, seizure, TIA (2007). CXR = no evidence of acute disease.  Pt has h/o PSP vs Parkinsonism gleaned from prior MBS 2019 report.  Her daughter reports pt's voice and cough has been weak chronically.  Pt has good appetite PTA and does not cough  with intake per family.  Daughter also reports pt has a decayed tooth that was diagnosed aprox 3 weeks ago - plan is for extraction in the future. Last CXR showed low lung volumes.  Subjective: pt awake in bed Assessment / Plan / Recommendation CHL IP CLINICAL IMPRESSIONS 01/20/2020 Clinical Impression MBS completed, Full report to follow.  Patient continues with mild oropharyngeal dysphagia with sensorimotor impairments with mild worsening compared to evaluation in 06/2017.  Mild decreased oral control with oral retention spilling into pharynx.  Residuals of liquids in oral cavity were swallowed but with delay and allowed intermittent aspiration of thin. Although aspiration was trace, it did not clear with cued cough.   Delayed pharyngeal swallow initiation, anterior position of epiglottis and decreased laryngeal elevation allows laryngeal penetration of thin/nectar and mild aspiration of thin liquids.  Chin tuck worsened laryngeal penetration.   Laying pt back to 45* compensated for decreased laryngeal closure and prevented penetration as it kept bolus posterior in pharynx.    No aspiration with nectar, pudding, cracker boluses.  No pharyngeal retention of solid or puree.   Pt did not transit tablet with pudding with 2 attempts, cued to expectorate.  Upon esophageal sweep, pt appeared with some residuals without awareness, residuals appeared with lighter shading ? secretions. Thin barium swallows appeared to help clear. Radiologist not present to confirm.     With family, pt agreeing to known chronicity of aspiration risk, recommend dys3/thin.  Medicine with puree - crushed with puree if large.  Will follow up for dysphagia management.  Recommend HOB lowered to 45* for po and remain in position for at least 30 minutes after meals.  Follow solids with liquids. SLP Visit Diagnosis Dysphagia, oropharyngeal  phase (R13.12) Attention and concentration deficit following -- Frontal lobe and executive function deficit  following -- Impact on safety and function Moderate aspiration risk   CHL IP TREATMENT RECOMMENDATION 01/20/2020 Treatment Recommendations Therapy as outlined in treatment plan below   Prognosis 01/20/2020 Prognosis for Safe Diet Advancement Fair Barriers to Reach Goals -- Barriers/Prognosis Comment -- CHL IP DIET RECOMMENDATION 01/20/2020 SLP Diet Recommendations Dysphagia 3 (Mech soft) solids;Thin liquid Liquid Administration via Cup;Straw Medication Administration Whole meds with liquid Compensations Slow rate;Chan sips/bites Postural Changes --   CHL IP OTHER RECOMMENDATIONS 01/20/2020 Recommended Consults -- Oral Care Recommendations Oral care QID Other Recommendations Have oral suction available   CHL IP FOLLOW UP RECOMMENDATIONS 01/20/2020 Follow up Recommendations 24 hour supervision/assistance   CHL IP FREQUENCY AND DURATION 01/20/2020 Speech Therapy Frequency (ACUTE ONLY) min 2x/week Treatment Duration 1 week;2 weeks      CHL IP ORAL PHASE 01/20/2020 Oral Phase Impaired Oral - Pudding Teaspoon -- Oral - Pudding Cup -- Oral - Honey Teaspoon -- Oral - Honey Cup -- Oral - Nectar Teaspoon -- Oral - Nectar Cup Weak lingual manipulation Oral - Nectar Straw Lingual/palatal residue;Weak lingual manipulation Oral - Thin Teaspoon Weak lingual manipulation Oral - Thin Cup Weak lingual manipulation;Lingual/palatal residue Oral - Thin Straw Weak lingual manipulation;Lingual/palatal residue Oral - Puree Reduced posterior propulsion;Lingual/palatal residue Oral - Mech Soft Reduced posterior propulsion;Impaired mastication Oral - Regular -- Oral - Multi-Consistency -- Oral - Pill Other (Comment);Decreased bolus cohesion;Delayed oral transit;Weak lingual manipulation Oral Phase - Comment --  CHL IP PHARYNGEAL PHASE 01/20/2020 Pharyngeal Phase Impaired Pharyngeal- Pudding Teaspoon -- Pharyngeal -- Pharyngeal- Pudding Cup -- Pharyngeal -- Pharyngeal- Honey Teaspoon -- Pharyngeal -- Pharyngeal- Honey Cup -- Pharyngeal -- Pharyngeal-  Nectar Teaspoon -- Pharyngeal -- Pharyngeal- Nectar Cup Delayed swallow initiation-vallecula;Penetration/Aspiration during swallow;Reduced airway/laryngeal closure Pharyngeal Material enters airway, remains ABOVE vocal cords then ejected out Pharyngeal- Nectar Straw Penetration/Aspiration during swallow;Reduced airway/laryngeal closure Pharyngeal Material enters airway, remains ABOVE vocal cords and not ejected out Pharyngeal- Thin Teaspoon -- Pharyngeal -- Pharyngeal- Thin Cup Reduced airway/laryngeal closure;Reduced laryngeal elevation;Pharyngeal residue - valleculae;Penetration/Aspiration during swallow Pharyngeal Material enters airway, passes BELOW cords without attempt by patient to eject out (silent aspiration) Pharyngeal- Thin Straw Compensatory strategies attempted (with notebox);Reduced laryngeal elevation;Reduced airway/laryngeal closure Pharyngeal Material enters airway, passes BELOW cords without attempt by patient to eject out (silent aspiration) Pharyngeal- Puree WFL Pharyngeal Material does not enter airway Pharyngeal- Mechanical Soft WFL Pharyngeal Material does not enter airway Pharyngeal- Regular -- Pharyngeal -- Pharyngeal- Multi-consistency -- Pharyngeal -- Pharyngeal- Pill NT Pharyngeal -- Pharyngeal Comment pt with TRACE laryngeal penetration of thin with HOB lowered to 45*- trace penetration x1 of  boluses with HOB lowered  CHL IP CERVICAL ESOPHAGEAL PHASE 01/20/2020 Cervical Esophageal Phase Impaired Pudding Teaspoon -- Pudding Cup -- Honey Teaspoon -- Honey Cup -- Nectar Teaspoon -- Nectar Cup -- Nectar Straw -- Thin Teaspoon -- Thin Cup -- Thin Straw -- Puree -- Mechanical Soft -- Regular -- Multi-consistency -- Pill -- Cervical Esophageal Comment Pt did not transit tablet with pudding with 2 attempts, cued to expectorate.  Upon esophageal sweep, pt appeared with some residuals without awareness, residuals appeared with lighter shading ? secretions. Thin barium swallows appeared to help  clear. Radiologist not present to confirm. Kathleen Lime, MS [facility] SLP Acute Rehab Services Office [phone] Macario Golds 01/20/2020, 2:06 PM              ECHOCARDIOGRAM COMPLETE  Result Date: 01/19/2020    ECHOCARDIOGRAM  REPORT   Patient Name:   LEONOR DARNELL Date of Exam: 01/18/2020 Medical Rec #:  [id number]        Height:       67.0 in Accession #:    [id number]       Weight:       169.3 lb Date of Birth:  [birth year]       BSA:          1.884 m Patient Age:    53 years         BP:           123/50 mmHg Patient Gender: F                HR:           128 bpm. Exam Location:  Inpatient Procedure: 2D Echo, Cardiac Doppler and Color Doppler Indications:    I50.23 Acute on chronic systolic (congestive) heart failure  History:        Patient has no prior history of Echocardiogram examinations.                 TIA, Aortic Valve Disease, Signs/Symptoms:Murmur; Risk                 Factors:Hypertension and Dyslipidemia. Hypothyroidism. Seizures.                 Cancer. GERD.                 Aortic Valve: 29 mm Edwards Sapien prosthetic, stented (TAVR)                 valve is present in the aortic position. Procedure Date: March                 2015.  Sonographer:    Jonelle Sidle Dance Referring Phys: [id number] Pisek  1. Patient was tachycardic in the 120s throughout the study.  2. Compared with the echo report 08/863, systolic function is worse. Left ventricular ejection fraction, by estimation, is 25 to 30%. The left ventricle has severely decreased function. The left ventricle demonstrates global hypokinesis. There is mild concentric left ventricular hypertrophy. Left ventricular diastolic parameters are indeterminate.  3. Right ventricular systolic function is normal. The right ventricular size is normal.  4. Left atrial size was mildly dilated.  5. The mitral valve is normal in structure. Trivial mitral valve regurgitation. No evidence of mitral stenosis.  6. The aortic valve has been  repaired/replaced. Aortic valve regurgitation is not visualized. No aortic stenosis is present. There is a 29 mm Edwards Sapien prosthetic (TAVR) valve present in the aortic position. Procedure Date: March 2015. Echo findings are consistent with normal structure and function of the aortic valve prosthesis. Aortic valve area, by VTI measures 1.33 cm. Aortic valve mean gradient measures 10.0 mmHg. Aortic valve Vmax measures 1.92 m/s.  7. Aortic dilatation noted. There is mild dilatation of the ascending aorta measuring 37 mm.  8. The inferior vena cava is normal in size with greater than 50% respiratory variability, suggesting right atrial pressure of 3 mmHg. FINDINGS  Left Ventricle: Compared with the echo report 12/8467, systolic function is worse. Left ventricular ejection fraction, by estimation, is 25 to 30%. The left ventricle has severely decreased function. The left ventricle demonstrates global hypokinesis. The left ventricular internal cavity size was normal in size. There is mild concentric left ventricular hypertrophy. Left ventricular diastolic parameters are indeterminate. Right Ventricle: The right ventricular size is normal.  No increase in right ventricular wall thickness. Right ventricular systolic function is normal. Left Atrium: Left atrial size was mildly dilated. Right Atrium: Right atrial size was normal in size. Pericardium: There is no evidence of pericardial effusion. Mitral Valve: Mitral valve leaflet tips appear to be attached as though she had a Mitraclip or Alfieri stitch. The mitral valve is normal in structure. Normal mobility of the mitral valve leaflets. Mild mitral annular calcification. Trivial mitral valve regurgitation. No evidence of mitral valve stenosis. Tricuspid Valve: The tricuspid valve is normal in structure. Tricuspid valve regurgitation is not demonstrated. No evidence of tricuspid stenosis. Aortic Valve: The aortic valve has been repaired/replaced. Aortic valve  regurgitation is not visualized. No aortic stenosis is present. Aortic valve mean gradient measures 10.0 mmHg. Aortic valve peak gradient measures 14.7 mmHg. Aortic valve area, by VTI measures 1.33 cm. There is a 29 mm Edwards Sapien prosthetic, stented (TAVR) valve present in the aortic position. Procedure Date: March 2015. Echo findings are consistent with normal structure and function of the aortic valve prosthesis. Pulmonic Valve: The pulmonic valve was normal in structure. Pulmonic valve regurgitation is not visualized. No evidence of pulmonic stenosis. Aorta: Aortic dilatation noted. There is mild dilatation of the ascending aorta measuring 37 mm. Venous: The inferior vena cava is normal in size with greater than 50% respiratory variability, suggesting right atrial pressure of 3 mmHg. IAS/Shunts: No atrial level shunt detected by color flow Doppler.  LEFT VENTRICLE PLAX 2D LVIDd:         5.10 cm LVIDs:         4.50 cm LV PW:         1.10 cm LV IVS:        1.30 cm LVOT diam:     1.90 cm LV SV:         41 LV SV Index:   22 LVOT Area:     2.84 cm  RIGHT VENTRICLE          IVC RV Basal diam:  2.50 cm  IVC diam: 1.50 cm TAPSE (M-mode): 1.2 cm LEFT ATRIUM             Index       RIGHT ATRIUM           Index LA diam:        4.60 cm 2.44 cm/m  RA Area:     12.20 cm LA Vol (A2C):   73.4 ml 38.96 ml/m RA Volume:   25.10 ml  13.32 ml/m LA Vol (A4C):   36.9 ml 19.59 ml/m LA Biplane Vol: 53.9 ml 28.61 ml/m  AORTIC VALVE AV Area (Vmax):    1.31 cm AV Area (Vmean):   1.16 cm AV Area (VTI):     1.33 cm AV Vmax:           192.00 cm/s AV Vmean:          147.000 cm/s AV VTI:            0.308 m AV Peak Grad:      14.7 mmHg AV Mean Grad:      10.0 mmHg LVOT Vmax:         88.65 cm/s LVOT Vmean:        60.050 cm/s LVOT VTI:          0.144 m LVOT/AV VTI ratio: 0.47  AORTA Ao Root diam: 3.30 cm Ao Asc diam:  3.70 cm MITRAL VALVE MV Area (PHT): 4.89 cm  SHUNTS MV Decel Time: 155 msec     Systemic VTI:  0.14 m MV E  velocity: 112.50 cm/s  Systemic Diam: 1.90 cm Skeet Latch MD Electronically signed by Skeet Latch MD Signature Date/Time: 01/19/2020/6:30:29 AM    Final     Lab Data:  CBC: Recent Labs  Lab 01/17/20 1500 01/19/20 0421  WBC 5.3 9.3  NEUTROABS 3.8  --   HGB 12.1 12.3  HCT 38.3 37.2  MCV 99.5 94.7  PLT 215 488   Basic Metabolic Panel: Recent Labs  Lab 01/18/20 0429 01/19/20 0421 01/20/20 0440 01/21/20 0453 01/22/20 0515  NA 135 136 134* 138 142  K 3.9 3.4* 3.4* 3.2* 3.9  CL 98 95* 96* 99 103  CO2 28 29 26 27 26   GLUCOSE 116* 107* 109* 124* 146*  BUN 22 27* 33* 38* 29*  CREATININE 0.87 1.02* 1.10* 1.04* 0.88  CALCIUM 8.6* 8.9 8.8* 8.8* 9.0  MG 1.8  --   --   --   --    GFR: Estimated Creatinine Clearance: 43.8 mL/min (by C-G formula based on SCr of 0.88 mg/dL). Liver Function Tests: Recent Labs  Lab 01/17/20 1500  AST 28  ALT 26  ALKPHOS 56  BILITOT 0.7  PROT 7.6  ALBUMIN 3.9   No results for input(s): LIPASE, AMYLASE in the last 168 hours. No results for input(s): AMMONIA in the last 168 hours. Coagulation Profile: No results for input(s): INR, PROTIME in the last 168 hours. Cardiac Enzymes: No results for input(s): CKTOTAL, CKMB, CKMBINDEX, TROPONINI in the last 168 hours. BNP (last 3 results) No results for input(s): PROBNP in the last 8760 hours. HbA1C: No results for input(s): HGBA1C in the last 72 hours. CBG: No results for input(s): GLUCAP in the last 168 hours. Lipid Profile: No results for input(s): CHOL, HDL, LDLCALC, TRIG, CHOLHDL, LDLDIRECT in the last 72 hours. Thyroid Function Tests: No results for input(s): TSH, T4TOTAL, FREET4, T3FREE, THYROIDAB in the last 72 hours. Anemia Panel: No results for input(s): VITAMINB12, FOLATE, FERRITIN, TIBC, IRON, RETICCTPCT in the last 72 hours. Urine analysis:    Component Value Date/Time   COLORURINE YELLOW 01/17/2020 2130   APPEARANCEUR HAZY (A) 01/17/2020 2130   LABSPEC 1.010 01/17/2020  2130   PHURINE 5.5 01/17/2020 2130   GLUCOSEU NEGATIVE 01/17/2020 2130   HGBUR TRACE (A) 01/17/2020 2130   BILIRUBINUR NEGATIVE 01/17/2020 2130   KETONESUR NEGATIVE 01/17/2020 2130   PROTEINUR NEGATIVE 01/17/2020 2130   UROBILINOGEN 1.0 11/25/2014 0220   NITRITE POSITIVE (A) 01/17/2020 2130   LEUKOCYTESUR Chan (A) 01/17/2020 2130     [place] M.D. [facility] 01/22/2020, 1:19 PM   Call night coverage person covering after 7pm

## 2020-01-22 NOTE — Progress Notes (Signed)
Progress Note  Patient Name: Betty Chan Date of Encounter: 01/22/2020  [facility] Cardiologist: No primary care provider on file.  Dr. [name] Duke  Subjective   86 year old with severe aortic stenosis status post TAVR in 2015 with breast cancer status post lumpectomy presented with shortness of breath.  5-day history of dyspnea worse with exertion.  Ankle swollen. Pulmonary edema versus atypical infection, negative CT for PE.  BNP 504.  EF on echocardiogram showed severe EF 25 to 30%  Resting in bed, phone on chest, moving.  Did not participate in conversation.  Inpatient Medications    Scheduled Meds: . atorvastatin  20 mg Oral Daily  . enoxaparin (LOVENOX) injection  40 mg Subcutaneous Q24H  . furosemide  40 mg Oral Daily  . guaiFENesin  600 mg Oral BID  . levothyroxine  50 mcg Oral QAC breakfast  . lisinopril  5 mg Oral Daily  . mouth rinse  15 mL Mouth Rinse BID  . metoprolol succinate  50 mg Oral BID  . mirabegron ER  50 mg Oral Daily  . pantoprazole  40 mg Oral Daily  . potassium chloride  20 mEq Oral TID  . sodium chloride flush  3 mL Intravenous Q12H   Continuous Infusions: . sodium chloride    . cefTRIAXone (ROCEPHIN)  IV 1 g (01/21/20 1312)  . metronidazole 500 mg (01/22/20 0031)   PRN Meds: sodium chloride, acetaminophen, hydrALAZINE, levalbuterol, sodium chloride flush   Vital Signs    Vitals:   01/21/20 1345 01/21/20 2057 01/22/20 0438 01/22/20 0500  BP: (!) 111/62 (!) 129/66 (!) 141/71   Pulse: 66 74 74   Resp: 18 18 18    Temp: 98.8 F (37.1 C) 97.7 F (36.5 C) 98.5 F (36.9 C)   TempSrc:  Oral Oral   SpO2: 95% 96% 95%   Weight:    73.3 kg  Height:        Intake/Output Summary (Last 24 hours) at 01/22/2020 0813 Last data filed at 01/22/2020 0500 Gross per 24 hour  Intake 240 ml  Output 800 ml  Net -560 ml   Last 3 Weights 01/22/2020 01/21/2020 01/18/2020  Weight (lbs) 161 lb 9.6 oz 164 lb 0.4 oz 169 lb 5 oz  Weight (kg) 73.3 kg  74.4 kg 76.8 kg      Telemetry    Sinus rhythm, PVCs noted bigeminy, mostly trigeminy and quadrigeminy, improved- Personally Reviewed   Physical Exam    GEN: Well nourished, well developed, elderly, arousable. HEENT: normal  Neck: no JVD, carotid bruits, or masses Cardiac: RRR; 2/6 systolic murmur no rubs, or gallops,no edema  Respiratory:  clear to auscultation bilaterally, normal work of breathing GI: soft, nontender, nondistended, + BS MS: no deformity or atrophy  Skin: warm and dry, no rash Neuro: Resting tremor noted. Psych: euthymic mood, full affect   Labs    High Sensitivity Troponin:   Recent Labs  Lab 01/17/20 1500 01/17/20 1719  TROPONINIHS 18* 20*      Chemistry Recent Labs  Lab 01/17/20 1500 01/18/20 0429 01/20/20 0440 01/21/20 0453 01/22/20 0515  NA 136   < > 134* 138 142  K 4.2   < > 3.4* 3.2* 3.9  CL 101   < > 96* 99 103  CO2 27   < > 26 27 26   GLUCOSE 82   < > 109* 124* 146*  BUN 29*   < > 33* 38* 29*  CREATININE 0.80   < > 1.10* 1.04*  0.88  CALCIUM 8.8*   < > 8.8* 8.8* 9.0  PROT 7.6  --   --   --   --   ALBUMIN 3.9  --   --   --   --   AST 28  --   --   --   --   ALT 26  --   --   --   --   ALKPHOS 56  --   --   --   --   BILITOT 0.7  --   --   --   --   GFRNONAA >60   < > 45* 48* 59*  GFRAA >60   < > 52* 56* >60  ANIONGAP 8   < > 12 12 13    < > = values in this interval not displayed.     Hematology Recent Labs  Lab 01/17/20 1500 01/19/20 0421  WBC 5.3 9.3  RBC 3.85* 3.93  HGB 12.1 12.3  HCT 38.3 37.2  MCV 99.5 94.7  MCH 31.4 31.3  MCHC 31.6 33.1  RDW 13.8 13.5  PLT 215 225    BNP Recent Labs  Lab 01/17/20 1547  BNP 504.5*     DDimer No results for input(s): DDIMER in the last 168 hours.   Radiology    DG Swallowing Func-Speech Pathology  Result Date: 01/20/2020 Objective Swallowing Evaluation: Type of Study: MBS-Modified Barium Swallow Study  Patient Details Name: Tyshika Baldridge MRN: [id number] Date of  Birth: [birth year] Today's Date: 01/20/2020 Time: SLP Start Time (ACUTE ONLY): 1205 -SLP Stop Time (ACUTE ONLY): 1239 SLP Time Calculation (min) (ACUTE ONLY): 34 min Past Medical History: Past Medical History: Diagnosis Date . Aortic stenosis   s/p TAVR 08/2013 -follows at [facility]) . Basal cell carcinoma  . Cancer of right breast [facility]) 2004  lumpectomy . Dyslipidemia  . GERD (gastroesophageal reflux disease)  . Heart murmur  . Hypertension  . Hypothyroid  . OA (osteoarthritis) of knee   "right" (11/24/2014) . Osteopenia 02/13/2014  DEXA 01/2014: -2.2 . PHN (postherpetic neuralgia)   L anterior thigh 2007, recurrent 04/2013 . Seizure ([place]) 2007  single event, off Keppra since 11/2013; "not sure if it was a TIA or seizure" . Squamous cell cancer of skin of forearm  . TIA (transient ischemic attack) 2007  "not sure if it was a TIA or seizure" Past Surgical History: Past Surgical History: Procedure Laterality Date . BASAL CELL CARCINOMA EXCISION    sternum . BREAST BIOPSY Right 2004 . BREAST LUMPECTOMY Right 08/2002 X 2 . CARDIAC CATHETERIZATION  "several" . CARDIAC VALVE REPLACEMENT   . CATARACT EXTRACTION W/ INTRAOCULAR LENS  IMPLANT, BILATERAL Bilateral 03/2014-07/2014 . SQUAMOUS CELL CARCINOMA EXCISION Left 06/2014  'forearm" . TONSILLECTOMY   . TRANSCATHETER AORTIC VALVE REPLACEMENT, TRANSFEMORAL  09/06/13  [facility] HPI: 86yo female admitted 01/17/20 with SOB, cough, BLE edema - suspect CHF exacerbation. PMH: severe aortic stenosis s/p TAVR (2015) breast cancer, HTN, HLD, hypothyroidism, GERD, OA, osteopenia, seizure, TIA (2007). CXR = no evidence of acute disease.  Pt has h/o PSP vs Parkinsonism gleaned from prior MBS 2019 report.  Her daughter reports pt's voice and cough has been weak chronically.  Pt has good appetite PTA and does not cough with intake per family.  Daughter also reports pt has a decayed tooth that was diagnosed aprox 3 weeks ago - plan is for extraction in the future. Last CXR showed low lung volumes.   Subjective: pt awake in bed Assessment / Plan /  Recommendation CHL IP CLINICAL IMPRESSIONS 01/20/2020 Clinical Impression MBS completed, Full report to follow.  Patient continues with mild oropharyngeal dysphagia with sensorimotor impairments with mild worsening compared to evaluation in 06/2017.  Mild decreased oral control with oral retention spilling into pharynx.  Residuals of liquids in oral cavity were swallowed but with delay and allowed intermittent aspiration of thin. Although aspiration was trace, it did not clear with cued cough.   Delayed pharyngeal swallow initiation, anterior position of epiglottis and decreased laryngeal elevation allows laryngeal penetration of thin/nectar and mild aspiration of thin liquids.  Chin tuck worsened laryngeal penetration.   Laying pt back to 45* compensated for decreased laryngeal closure and prevented penetration as it kept bolus posterior in pharynx.    No aspiration with nectar, pudding, cracker boluses.  No pharyngeal retention of solid or puree.   Pt did not transit tablet with pudding with 2 attempts, cued to expectorate.  Upon esophageal sweep, pt appeared with some residuals without awareness, residuals appeared with lighter shading ? secretions. Thin barium swallows appeared to help clear. Radiologist not present to confirm.     With family, pt agreeing to known chronicity of aspiration risk, recommend dys3/thin.  Medicine with puree - crushed with puree if large.  Will follow up for dysphagia management.  Recommend HOB lowered to 45* for po and remain in position for at least 30 minutes after meals.  Follow solids with liquids. SLP Visit Diagnosis Dysphagia, oropharyngeal phase (R13.12) Attention and concentration deficit following -- Frontal lobe and executive function deficit following -- Impact on safety and function Moderate aspiration risk   CHL IP TREATMENT RECOMMENDATION 01/20/2020 Treatment Recommendations Therapy as outlined in treatment plan below    Prognosis 01/20/2020 Prognosis for Safe Diet Advancement Fair Barriers to Reach Goals -- Barriers/Prognosis Comment -- CHL IP DIET RECOMMENDATION 01/20/2020 SLP Diet Recommendations Dysphagia 3 (Mech soft) solids;Thin liquid Liquid Administration via Cup;Straw Medication Administration Whole meds with liquid Compensations Slow rate;Small sips/bites Postural Changes --   CHL IP OTHER RECOMMENDATIONS 01/20/2020 Recommended Consults -- Oral Care Recommendations Oral care QID Other Recommendations Have oral suction available   CHL IP FOLLOW UP RECOMMENDATIONS 01/20/2020 Follow up Recommendations 24 hour supervision/assistance   CHL IP FREQUENCY AND DURATION 01/20/2020 Speech Therapy Frequency (ACUTE ONLY) min 2x/week Treatment Duration 1 week;2 weeks      CHL IP ORAL PHASE 01/20/2020 Oral Phase Impaired Oral - Pudding Teaspoon -- Oral - Pudding Cup -- Oral - Honey Teaspoon -- Oral - Honey Cup -- Oral - Nectar Teaspoon -- Oral - Nectar Cup Weak lingual manipulation Oral - Nectar Straw Lingual/palatal residue;Weak lingual manipulation Oral - Thin Teaspoon Weak lingual manipulation Oral - Thin Cup Weak lingual manipulation;Lingual/palatal residue Oral - Thin Straw Weak lingual manipulation;Lingual/palatal residue Oral - Puree Reduced posterior propulsion;Lingual/palatal residue Oral - Mech Soft Reduced posterior propulsion;Impaired mastication Oral - Regular -- Oral - Multi-Consistency -- Oral - Pill Other (Comment);Decreased bolus cohesion;Delayed oral transit;Weak lingual manipulation Oral Phase - Comment --  CHL IP PHARYNGEAL PHASE 01/20/2020 Pharyngeal Phase Impaired Pharyngeal- Pudding Teaspoon -- Pharyngeal -- Pharyngeal- Pudding Cup -- Pharyngeal -- Pharyngeal- Honey Teaspoon -- Pharyngeal -- Pharyngeal- Honey Cup -- Pharyngeal -- Pharyngeal- Nectar Teaspoon -- Pharyngeal -- Pharyngeal- Nectar Cup Delayed swallow initiation-vallecula;Penetration/Aspiration during swallow;Reduced airway/laryngeal closure Pharyngeal Material  enters airway, remains ABOVE vocal cords then ejected out Pharyngeal- Nectar Straw Penetration/Aspiration during swallow;Reduced airway/laryngeal closure Pharyngeal Material enters airway, remains ABOVE vocal cords and not ejected out Pharyngeal- Thin Teaspoon -- Pharyngeal -- Pharyngeal- Thin  Cup Reduced airway/laryngeal closure;Reduced laryngeal elevation;Pharyngeal residue - valleculae;Penetration/Aspiration during swallow Pharyngeal Material enters airway, passes BELOW cords without attempt by patient to eject out (silent aspiration) Pharyngeal- Thin Straw Compensatory strategies attempted (with notebox);Reduced laryngeal elevation;Reduced airway/laryngeal closure Pharyngeal Material enters airway, passes BELOW cords without attempt by patient to eject out (silent aspiration) Pharyngeal- Puree WFL Pharyngeal Material does not enter airway Pharyngeal- Mechanical Soft WFL Pharyngeal Material does not enter airway Pharyngeal- Regular -- Pharyngeal -- Pharyngeal- Multi-consistency -- Pharyngeal -- Pharyngeal- Pill NT Pharyngeal -- Pharyngeal Comment pt with TRACE laryngeal penetration of thin with HOB lowered to 45*- trace penetration x1 of  boluses with HOB lowered  CHL IP CERVICAL ESOPHAGEAL PHASE 01/20/2020 Cervical Esophageal Phase Impaired Pudding Teaspoon -- Pudding Cup -- Honey Teaspoon -- Honey Cup -- Nectar Teaspoon -- Nectar Cup -- Nectar Straw -- Thin Teaspoon -- Thin Cup -- Thin Straw -- Puree -- Mechanical Soft -- Regular -- Multi-consistency -- Pill -- Cervical Esophageal Comment Pt did not transit tablet with pudding with 2 attempts, cued to expectorate.  Upon esophageal sweep, pt appeared with some residuals without awareness, residuals appeared with lighter shading ? secretions. Thin barium swallows appeared to help clear. Radiologist not present to confirm. Kathleen Lime, MS [facility] SLP Acute Rehab Services Office [phone] Macario Golds 01/20/2020, 2:06 PM               Assessment & Plan      Acute on chronic systolic heart failure -EF 25 to 30% -Yesterday I increased the beta-blocker, Toprol-XL to 50 mg twice a day from 37.5 twice a day.  Heart rate appears well controlled.  Helpful for cardiomyopathy as well.  Trying to decrease PVC burden as well. -Continue with low-dose ACE inhibitor.  Lasix 40 p.o. creatinine trended downward 0.88. 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  PVCs seem to have the same morphology vastly. -Cardiac catheterization in 2015 showed no significant CAD.  Aortic stenosis post TAVR -2015.  Stable.  Dysphagia leading to aspiration -Barium swallow.  Elevated troponin/demand ischemia -Secondary to underlying systolic heart failure.  Not ACS. Appreciate palliative care service as well.  No new recommendations at this time.  Continue with current medication plan.  We will go ahead and sign off.  Please let us know if we can be of further assistance.  For questions or updates, please contact [place] Please consult [email or web address] for contact info under        Signed, Candee Furbish, MD  01/22/2020, 8:13 AM

## 2020-01-22 NOTE — Progress Notes (Signed)
Patient has been in ventricular trigeminy, but recently converted to ventricular bigeminy. HR did decrease to 40s very briefly but did quickly increase to 70-80s. Pt asymptomatic, VSS. EKG done. MD Rai made aware who consulted cardiology. No changes at this time. Will continue to monitor.

## 2020-01-23 DIAGNOSIS — Z9189 Other specified personal risk factors, not elsewhere classified: Secondary | ICD-10-CM

## 2020-01-23 DIAGNOSIS — L89611 Pressure ulcer of right heel, stage 1: Secondary | ICD-10-CM

## 2020-01-23 LAB — BASIC METABOLIC PANEL
Anion gap: 9 (ref 5–15)
BUN: 33 mg/dL — ABNORMAL HIGH (ref 8–23)
CO2: 26 mmol/L (ref 22–32)
Calcium: 8.8 mg/dL — ABNORMAL LOW (ref 8.9–10.3)
Chloride: 106 mmol/L (ref 98–111)
Creatinine, Ser: 1.04 mg/dL — ABNORMAL HIGH (ref 0.44–1.00)
GFR calc Af Amer: 56 mL/min — ABNORMAL LOW (ref >60)
GFR calc non Af Amer: 48 mL/min — ABNORMAL LOW (ref >60)
Glucose, Bld: 114 mg/dL — ABNORMAL HIGH (ref 70–99)
Potassium: 4.4 mmol/L (ref 3.5–5.1)
Sodium: 141 mmol/L (ref 135–145)

## 2020-01-23 LAB — CBC
HCT: 38.4 % (ref 36.0–46.0)
Hemoglobin: 12 g/dL (ref 12.0–15.0)
MCH: 31.2 pg (ref 26.0–34.0)
MCHC: 31.3 g/dL (ref 30.0–36.0)
MCV: 99.7 fL (ref 80.0–100.0)
Platelets: 315 10*3/uL (ref 150–400)
RBC: 3.85 MIL/uL — ABNORMAL LOW (ref 3.87–5.11)
RDW: 13.5 % (ref 11.5–15.5)
WBC: 9.5 10*3/uL (ref 4.0–10.5)
nRBC: 0 % (ref 0.0–0.2)

## 2020-01-23 MED ORDER — IPRATROPIUM-ALBUTEROL 0.5-2.5 (3) MG/3ML IN SOLN
3.0000 mL | Freq: Two times a day (BID) | RESPIRATORY_TRACT | Status: DC
  Administered 2020-01-23 – 2020-01-31 (×16): 3 mL via RESPIRATORY_TRACT
  Filled 2020-01-23 (×16): qty 3

## 2020-01-23 MED ORDER — IPRATROPIUM-ALBUTEROL 0.5-2.5 (3) MG/3ML IN SOLN: 3.0000 mL | Freq: Four times a day (QID) | RESPIRATORY_TRACT | Status: DC

## 2020-01-23 MED ORDER — CEFDINIR 300 MG PO CAPS
300.0000 mg | ORAL_CAPSULE | Freq: Two times a day (BID) | ORAL | Status: DC
  Administered 2020-01-23 – 2020-01-26 (×7): 300 mg via ORAL
  Filled 2020-01-23 (×7): qty 1

## 2020-01-23 NOTE — Progress Notes (Signed)
AFT ASSESSING THE PT'S RH ORDERED A FLUTTER TO HELP CLEAR SOME OFF THE PT'S RH. RT WILL CONTINUE TO MONITOR

## 2020-01-23 NOTE — Progress Notes (Signed)
Palliative Care Brief Note  I stopped by to see Betty Chan this afternoon.  No family present in room.  I called and left a message for her daughter, [name].  Family has been consistent in plan to continue with aggressive interventions in the care of Ms. Cino as this has been her stated desire.  Agree that she would benefit from outpatient palliative care following if family is in agreement.  Micheline Rough, MD [place] [facility] Team [phone]  NO CHARGE NOTE

## 2020-01-23 NOTE — Progress Notes (Addendum)
PROGRESS NOTE    Betty Chan  XTK:[id number]  DOB: 02/04/1930  PCP: Maurice Small, MD [place] date:01/17/2020 Chief compliant: Dyspnea Hospital course:86 year old female with history of severe aortic stenosis status post TAVR in 2015, breast CA status post lumpectomy, hypertension, hyperlipidemia, hypothyroidism, GERD presented with exertional dyspnea  for 5 days prior to admission associated with coughing and ankle swelling. Denies prior history of COPD or CHF.Chest x-ray showed pulmonary edema versus atypical infection.CTA chest negative for PE, showed patchy atelectasis, nonspecific peribronchial thickening and cardiomegaly, BNP elevated 504. Patient admitted to [facility] for further evaluation and management.  Subjective:  Patient appears somewhat tachypneic and in respiratory distress with upper airway secretions.  Still requiring 4 L O2.  Oriented x3 but appears tired/lethargic and slow to respond to questions.  Objective: Vitals:   01/22/20 1621 01/22/20 2050 01/23/20 0500 01/23/20 0639  BP: (!) 125/103 (!) 109/52  (!) 115/53  Pulse: 96 72  68  Resp: 20 18  19   Temp: 98.6 F (37 C) 99.7 F (37.6 C)  97.9 F (36.6 C)  TempSrc: Oral Oral  Oral  SpO2: 98% 93%  96%  Weight:   76.3 kg   Height:        Intake/Output Summary (Last 24 hours) at 01/23/2020 1104 Last data filed at 01/23/2020 0600 Gross per 24 hour  Intake 500 ml  Output --  Net 500 ml   Filed Weights   01/21/20 0621 01/22/20 0500 01/23/20 0500  Weight: 74.4 kg 73.3 kg 76.3 kg    Physical Examination: General: Moderately built, no acute distress noted Head ENT: Atraumatic normocephalic, PERRLA, on 4 L O2 nasal cannula Heart: S1-S2 heard, regular rate and rhythm, no murmurs.  No leg edema noted Lungs: Upper airway sounds,poor cough reflex,  mild basilar crackles on exam, mild neck accessory muscle use Abdomen: Bowel sounds heard, soft, nontender, nondistended. No organomegaly. No CVA tenderness Extremities: No  pedal edema.  No cyanosis or clubbing. Neurological: Awake but somewhat lethargic, oriented x3 and answers questions slowly, no focal weakness or numbness, strength and sensations to crude touch intact Skin: Stage I right heel ulcer  Data Reviewed: I have personally reviewed following labs and imaging studies  CBC: Recent Labs  Lab 01/17/20 1500 01/19/20 0421 01/23/20 0502  WBC 5.3 9.3 9.5  NEUTROABS 3.8  --   --   HGB 12.1 12.3 12.0  HCT 38.3 37.2 38.4  MCV 99.5 94.7 99.7  PLT 215 225 992   Basic Metabolic Panel: Recent Labs  Lab 01/18/20 0429 01/18/20 0429 01/19/20 0421 01/19/20 0421 01/20/20 0440 01/21/20 0453 01/22/20 0515 01/22/20 1738 01/23/20 0502  NA 135   < > 136  --  134* 138 142  --  141  K 3.9   < > 3.4*   < > 3.4* 3.2* 3.9 4.3 4.4  CL 98   < > 95*  --  96* 99 103  --  106  CO2 28   < > 29  --  26 27 26   --  26  GLUCOSE 116*   < > 107*  --  109* 124* 146*  --  114*  BUN 22   < > 27*  --  33* 38* 29*  --  33*  CREATININE 0.87   < > 1.02*  --  1.10* 1.04* 0.88  --  1.04*  CALCIUM 8.6*   < > 8.9  --  8.8* 8.8* 9.0  --  8.8*  MG 1.8  --   --   --   --   --   --  1.9  --    < > = values in this interval not displayed.   GFR: Estimated Creatinine Clearance: 40.6 mL/min (A) (by C-G formula based on SCr of 1.04 mg/dL (H)). Liver Function Tests: Recent Labs  Lab 01/17/20 1500  AST 28  ALT 26  ALKPHOS 56  BILITOT 0.7  PROT 7.6  ALBUMIN 3.9   No results for input(s): LIPASE, AMYLASE in the last 168 hours. No results for input(s): AMMONIA in the last 168 hours. Coagulation Profile: No results for input(s): INR, PROTIME in the last 168 hours. Cardiac Enzymes: No results for input(s): CKTOTAL, CKMB, CKMBINDEX, TROPONINI in the last 168 hours. BNP (last 3 results) No results for input(s): PROBNP in the last 8760 hours. HbA1C: No results for input(s): HGBA1C in the last 72 hours. CBG: No results for input(s): GLUCAP in the last 168 hours. Lipid  Profile: No results for input(s): CHOL, HDL, LDLCALC, TRIG, CHOLHDL, LDLDIRECT in the last 72 hours. Thyroid Function Tests: No results for input(s): TSH, T4TOTAL, FREET4, T3FREE, THYROIDAB in the last 72 hours. Anemia Panel: No results for input(s): VITAMINB12, FOLATE, FERRITIN, TIBC, IRON, RETICCTPCT in the last 72 hours. Sepsis Labs: Recent Labs  Lab 01/18/20 0429 01/19/20 0421 01/20/20 0440  PROCALCITON 0.66 5.85 4.31    Recent Results (from the past 240 hour(s))  SARS Coronavirus 2 by RT PCR (hospital order, performed in [facility] hospital lab) Nasopharyngeal Nasopharyngeal Swab     Status: None   Collection Time: 01/17/20  3:12 PM   Specimen: Nasopharyngeal Swab  Result Value Ref Range Status   SARS Coronavirus 2 NEGATIVE NEGATIVE Final    Comment: (NOTE) SARS-CoV-2 target nucleic acids are NOT DETECTED.  The SARS-CoV-2 RNA is generally detectable in upper and lower respiratory specimens during the acute phase of infection. The lowest concentration of SARS-CoV-2 viral copies this assay can detect is 250 copies / mL. A negative result does not preclude SARS-CoV-2 infection and should not be used as the sole basis for treatment or other patient management decisions.  A negative result may occur with improper specimen collection / handling, submission of specimen other than nasopharyngeal swab, presence of viral mutation(s) within the areas targeted by this assay, and inadequate number of viral copies (<250 copies / mL). A negative result must be combined with clinical observations, patient history, and epidemiological information.  Fact Sheet for Patients:   StrictlyIdeas.no  Fact Sheet for Healthcare Providers: BankingDealers.co.za  This test is not yet approved or  cleared by the [place] FDA and has been authorized for detection and/or diagnosis of SARS-CoV-2 by FDA under an Emergency Use Authorization (EUA).   This EUA will remain in effect (meaning this test can be used) for the duration of the COVID-19 declaration under Section 564(b)(1) of the Act, 21 U.S.C. section 360bbb-3(b)(1), unless the authorization is terminated or revoked sooner.  Performed at [facility], [place] [address] [place], [place] [address]   Urine Culture     Status: Abnormal   Collection Time: 01/17/20  9:30 PM   Specimen: Urine, Random  Result Value Ref Range Status   Specimen Description   Final    URINE, RANDOM Performed at [place] [address] [place], [place] [address]    Special Requests   Final    NONE Performed at [facility], [place] [address] [place], [place] [address]    Culture >=100,000 COLONIES/mL ESCHERICHIA COLI (A)  Final   Report Status 01/20/2020 FINAL  Final   Organism ID, Bacteria ESCHERICHIA COLI (A)  Final      Susceptibility   Escherichia coli - MIC*    AMPICILLIN 8 SENSITIVE Sensitive     CEFAZOLIN <=4 SENSITIVE Sensitive     CEFTRIAXONE <=0.25 SENSITIVE Sensitive     CIPROFLOXACIN <=0.25 SENSITIVE Sensitive     GENTAMICIN <=1 SENSITIVE Sensitive     IMIPENEM <=0.25 SENSITIVE Sensitive     NITROFURANTOIN 32 SENSITIVE Sensitive     TRIMETH/SULFA <=20 SENSITIVE Sensitive     AMPICILLIN/SULBACTAM 4 SENSITIVE Sensitive     PIP/TAZO <=4 SENSITIVE Sensitive     * >=100,000 COLONIES/mL ESCHERICHIA COLI  MRSA PCR Screening     Status: None   Collection Time: 01/17/20 11:33 PM   Specimen: Nasal Mucosa; Nasopharyngeal  Result Value Ref Range Status   MRSA by PCR NEGATIVE NEGATIVE Final    Comment:        The GeneXpert MRSA Assay (FDA approved for NASAL specimens only), is one component of a comprehensive MRSA colonization surveillance program. It is not intended to diagnose MRSA infection nor to guide or monitor treatment for MRSA infections. Performed at [facility], [place] [address],  [place], [place] [address]       Radiology Studies: No results found.    Scheduled Meds: . atorvastatin  20 mg Oral Daily  . enoxaparin (LOVENOX) injection  40 mg Subcutaneous Q24H  . furosemide  40 mg Oral Daily  . guaiFENesin  600 mg Oral BID  . levothyroxine  50 mcg Oral QAC breakfast  . lisinopril  5 mg Oral Daily  . mouth rinse  15 mL Mouth Rinse BID  . metoprolol succinate  50 mg Oral BID  . mirabegron ER  50 mg Oral Daily  . pantoprazole  40 mg Oral Daily  . potassium chloride  20 mEq Oral TID  . sodium chloride flush  3 mL Intravenous Q12H   Continuous Infusions: . sodium chloride    . cefTRIAXone (ROCEPHIN)  IV Stopped (01/22/20 2205)  . metronidazole 500 mg (01/23/20 1008)      Assessment/Plan:  Acute hypoxic respiratory failure: Present on admission Secondary to acute on chronic combined CHF as well as aspiration pneumonia.  Still requiring 4 L O2, continue treatment as described below and taper O2 to off.  Acute on chronic combined systolic/diastolic CHF: Patient has history of severe aortic stenosis status post TAVR, echo in 2016 ([facility]) showed EF 72%, grade 1 diastolic dysfunction.  2D echo showed EF of 25 to 30% with global hypokinesis, left ventricular severely decreased function.  Patient  diuresed with IV Lasix 40 mg q12 hours, now has been transitioned to oral 40 mg daily.  Patient had mildly elevated troponin and seen by cardiology, no further work-up recommended since cardiac cath in 2015 had showed no significant CAD.  Per cardiology, continue with the low-dose ACE inhibitor, Toprol-XL increased to 50 mg twice daily, Lasix 40 mg daily, signed off 8/1  Aspiration pneumonia: Seen by speech therapy and placed on dysphagia 3 diet however continue to have coarse breath sounds and changed to dysphagia 2 diet.  Speech therapy following along.  Patient is allergic to penicillin, initially treated with IV vancomycin Rocephin and Flagyl.  Now off IV Vancomycin as MRSA PCR  negative.  Day 5 of Rocephin/Flagyl.  Can likely DC Flagyl after today but would like to discuss with family regarding continued aspiration risk.  Will order scheduled bronchodilators in addition to as needed use.  Suction as needed.  E. coli UTI-UA showed positive nitrite, leukocytes, many bacteria, > 50 WBC, urine culture growing pansensitive E. Coli.  Currently on IV Rocephin--will transition to oral antibiotics Omnicef for 2 more days.  Hypothyroidism-Continue Synthroid, TSH 1.6, previously 4.79  Essential hypertension-Continue Lasix, beta-blocker, ACE inhibitor  History of aortic stenosis status post TAVR in 2015-2D echo as above  Pressure injury of skin-Right heel stage I, POA.  Continue local wound care.  Generalized debility, Parkinson's disease - per daughter, wheel chair bound for last 4-5 years, otherwise cognitively intact, has a hard time vocalizing (whispering is chronic for the patient per daughter).  Seen by palliative care team during this hospitalization regarding care goals.  Remains full code. -PT OT evaluation done, recommended return to ALF with 24/7 caregivers      DVT prophylaxis: Lovenox Code Status: Remains full code Family / Patient Communication: Palliative care in discussion with family,  will also call and update later today. Disposition Plan:   Status is: Inpatient  Remains inpatient appropriate because:IV treatments appropriate due to intensity of illness or inability to take PO and Inpatient level of care appropriate due to severity of illness  Addendum: d/w daughter, [name], regarding overall poor prognosis, reviewed prior fluroscopic study from 2019 as well as MBS results with her. Daughter understands her mother is not a candidate for PEG tube and that she remains high risk for recurrent aspiration . Will continue with scheduled nebs/periodic suctioning/ flutter for secretions, abx and aspiration precautions. Daughter hopes for the best but also  understands her mother, if intubated, may not be able to wean easily given advanced age,  generalized muscle weakness and underlying comorbidities. I will follow up with family in am. Pursue aggressive care for now.  Dispo: The patient is from: [facility]              Anticipated d/c is to: ALF              Anticipated d/c date is: 2 days              Patient currently is not medically stable to d/c.  Time spent: 35  minutes >50% time spent in discussions with care team and coordination of care.    Guilford Shi, MD [facility] Pager in [place]  If 7PM-7AM, please contact night-coverage [email or web address] 01/23/2020, 11:04 AM

## 2020-01-23 NOTE — NC FL2 (Signed)
[place] LEVEL OF CARE SCREENING TOOL     IDENTIFICATION  Patient Name: Betty Chan Birthdate: [birth year] Sex: female Admission Date (Current Location): 01/17/2020  [facility] and [place] Number:  Herbalist and Address:  [facility],  [place] [address], [place]      Provider Number: [id number]  Attending Physician Name and Address:  Guilford Shi, MD  Relative Name and Phone Number:       Current Level of Care: Hospital Recommended Level of Care: [place] Prior Approval Number:    Date Approved/Denied:   PASRR Number: [id number]  Discharge Plan: SNF    Current Diagnoses: Patient Active Problem List   Diagnosis Date Noted  . Parkinsonism ([place])   . Palliative care by specialist   . Pressure injury of skin 01/18/2020  . Acute respiratory failure with hypoxia ([place]) 01/17/2020  . Acute exacerbation of CHF (congestive heart failure) ([place]) 01/17/2020  . Gait instability 01/17/2016  . Routine general medical examination at a health care facility 07/20/2015  . Chronic venous insufficiency 01/03/2015  . Elevated troponin 11/24/2014  . Osteoarthritis of right lower extremity 11/13/2014  . Osteopenia 02/13/2014  . Essential hypertension   . History of breast cancer 11/09/2013  . Seizures ([place]) 10/04/2013  . H/O aortic valve replacement TAVR 09/07/2013  . Hypothyroid   . Hyperlipidemia   . PHN (postherpetic neuralgia)   . GERD (gastroesophageal reflux disease)   . OAB (overactive bladder)     Orientation RESPIRATION BLADDER Height & Weight     Self, Time, Situation, Place  Normal Continent Weight: 76.3 kg Height:  5\' 7"  (170.2 cm)  BEHAVIORAL SYMPTOMS/MOOD NEUROLOGICAL BOWEL NUTRITION STATUS      Continent Diet (regular)  AMBULATORY STATUS COMMUNICATION OF NEEDS Skin   Extensive Assist   Normal                       Personal Care Assistance Level of Assistance  Bathing, Feeding, Dressing  Bathing Assistance: Limited assistance Feeding assistance: Limited assistance Dressing Assistance: Limited assistance     Functional Limitations Info  Sight, Hearing, Speech Sight Info: Adequate Hearing Info: Adequate Speech Info: Adequate    SPECIAL CARE FACTORS FREQUENCY  PT (By licensed PT), OT (By licensed OT)     PT Frequency: 5xweekly OT Frequency: 5xweekly            Contractures      Additional Factors Info  Code Status Code Status Info: full             Current Medications (01/23/2020):  This is the current hospital active medication list Current Facility-Administered Medications  Medication Dose Route Frequency Provider Last Rate Last Admin  . 0.9 %  sodium chloride infusion  250 mL Intravenous PRN Shela Leff, MD      . acetaminophen (TYLENOL) tablet 650 mg  650 mg Oral Q4H PRN Shela Leff, MD      . atorvastatin (LIPITOR) tablet 20 mg  20 mg Oral Daily Rai, Ripudeep K, MD   20 mg at 01/23/20 0959  . capsaicin (ZOSTRIX) 0.025 % cream   Topical TID PRN Mendel Corning, MD   Given at 01/22/20 1900  . cefTRIAXone (ROCEPHIN) 1 g in sodium chloride 0.9 % 100 mL IVPB  1 g Intravenous Q24H Rai, Vernelle Emerald, MD   Stopped at 01/22/20 2205  . enoxaparin (LOVENOX) injection 40 mg  40 mg Subcutaneous Q24H Shela Leff, MD   40  mg at 01/22/20 2158  . furosemide (LASIX) tablet 40 mg  40 mg Oral Daily Croitoru, Mihai, MD   40 mg at 01/23/20 0958  . guaiFENesin (MUCINEX) 12 hr tablet 600 mg  600 mg Oral BID Shela Leff, MD   600 mg at 01/23/20 0959  . hydrALAZINE (APRESOLINE) injection 5 mg  5 mg Intravenous Q4H PRN Shela Leff, MD   5 mg at 01/17/20 2128  . levalbuterol (XOPENEX) nebulizer solution 0.63 mg  0.63 mg Nebulization Q6H PRN Rai, Ripudeep K, MD   0.63 mg at 01/19/20 1455  . levothyroxine (SYNTHROID) tablet 50 mcg  50 mcg Oral QAC breakfast Rai, Ripudeep K, MD   50 mcg at 01/23/20 0514  . lisinopril (ZESTRIL) tablet 5 mg  5 mg Oral  Daily Croitoru, Mihai, MD   5 mg at 01/23/20 0958  . MEDLINE mouth rinse  15 mL Mouth Rinse BID Shela Leff, MD   15 mL at 01/23/20 1000  . metoprolol succinate (TOPROL-XL) 24 hr tablet 50 mg  50 mg Oral BID Jerline Pain, MD   50 mg at 01/23/20 0959  . metroNIDAZOLE (FLAGYL) IVPB 500 mg  500 mg Intravenous Q8H Rai, Ripudeep K, MD 100 mL/hr at 01/23/20 1008 500 mg at 01/23/20 1008  . mirabegron ER (MYRBETRIQ) tablet 50 mg  50 mg Oral Daily Rai, Ripudeep K, MD   50 mg at 01/23/20 0958  . pantoprazole (PROTONIX) EC tablet 40 mg  40 mg Oral Daily Rai, Ripudeep K, MD   40 mg at 01/23/20 0958  . potassium chloride SA (KLOR-CON) CR tablet 20 mEq  20 mEq Oral TID Croitoru, Mihai, MD   20 mEq at 01/23/20 0959  . sodium chloride flush (NS) 0.9 % injection 3 mL  3 mL Intravenous Q12H Shela Leff, MD   3 mL at 01/23/20 1001  . sodium chloride flush (NS) 0.9 % injection 3 mL  3 mL Intravenous PRN Shela Leff, MD   3 mL at 01/20/20 1326     Discharge Medications: Please see discharge summary for a list of discharge medications.  Relevant Imaging Results:  Relevant Lab Results:   Additional Information ssn:[id number]  Leeroy Cha, RN

## 2020-01-23 NOTE — Care Management Important Message (Signed)
Important Message  Patient Details IM Letter given to the Patient Name: Betty Chan MRN: [id number] Date of Birth: 1929/11/26   Medicare Important Message Given:  Yes     Kerin Salen 01/23/2020, 11:25 AM

## 2020-01-23 NOTE — TOC Initial Note (Signed)
Transition of Care [facility]) - Initial/Assessment Note    Patient Details  Name: Betty Chan MRN: [id number] Date of Birth: [birth year]  Transition of [facility]) CM/SW Contact:    Leeroy Cha, RN Phone Number: 01/23/2020, 8:28 AM  Clinical Narrative:                 Sever heart failure, asp. Pna, wcb wnl, bun 33 creat 1.04 bothgfr decreased, uti due to e. Coli. From [facility] assisted loving facility p-to return to [place]   Expected Discharge Plan: Assisted Living Barriers to Discharge: Continued Medical Work up   Patient Goals and CMS Choice Patient states their goals for this hospitalization and ongoing recovery are:: to go back to [employer].gov Compare Post Acute Care list provided to:: Patient    Expected Discharge Plan and Services Expected Discharge Plan: Assisted Living   Discharge Planning Services: CM Consult   Living arrangements for the past 2 months: [place]                                      Prior Living Arrangements/Services Living arrangements for the past 2 months: [place] Lives with:: Facility Resident Patient language and need for interpreter reviewed:: Yes        Need for Family Participation in Patient Care: Yes (Comment) Care giver support system in place?: Yes (comment)   Criminal Activity/Legal Involvement Pertinent to Current Situation/Hospitalization: No - Comment as needed  Activities of Daily Living Home Assistive Devices/Equipment: Hearing aid, Eyeglasses, Other (Comment) (bilateral hearing aides, transport chair) ADL Screening (condition at time of admission) Patient's cognitive ability adequate to safely complete daily activities?: Yes Is the patient deaf or have difficulty hearing?: Yes Does the patient have difficulty seeing, even when wearing glasses/contacts?: No Does the patient have difficulty concentrating, remembering, or making decisions?: No Patient  able to express need for assistance with ADLs?: Yes Does the patient have difficulty dressing or bathing?: Yes Independently performs ADLs?: No Communication: Independent Dressing (OT): Needs assistance Is this a change from baseline?: Pre-admission baseline Grooming: Needs assistance Is this a change from baseline?: Pre-admission baseline Feeding: Independent Bathing: Needs assistance Is this a change from baseline?: Pre-admission baseline Toileting: Needs assistance Is this a change from baseline?: Pre-admission baseline In/Out Bed: Needs assistance Is this a change from baseline?: Pre-admission baseline Walks in Home: Dependent Is this a change from baseline?: Pre-admission baseline Does the patient have difficulty walking or climbing stairs?: Yes Weakness of Legs: Both Weakness of Arms/Hands: Both  Permission Sought/Granted                  Emotional Assessment Appearance:: Appears stated age Attitude/Demeanor/Rapport: Engaged Affect (typically observed): Calm Orientation: : Oriented to Place, Oriented to Self, Oriented to  Time, Oriented to Situation Alcohol / Substance Use: Not Applicable Psych Involvement: No (comment)  Admission diagnosis:  Hypoxia [R09.02] Acute on chronic respiratory failure with hypoxia ([place]) [J96.21] New onset of congestive heart failure ([place]) [I50.9] Patient Active Problem List   Diagnosis Date Noted  . Parkinsonism ([place])   . Palliative care by specialist   . Pressure injury of skin 01/18/2020  . Acute respiratory failure with hypoxia ([place]) 01/17/2020  . Acute exacerbation of CHF (congestive heart failure) ([place]) 01/17/2020  . Gait instability 01/17/2016  . Routine general medical examination at a health care facility 07/20/2015  . Chronic venous insufficiency 01/03/2015  .  Elevated troponin 11/24/2014  . Osteoarthritis of right lower extremity 11/13/2014  . Osteopenia 02/13/2014  . Essential hypertension   . History of breast cancer  11/09/2013  . Seizures ([place]) 10/04/2013  . H/O aortic valve replacement TAVR 09/07/2013  . Hypothyroid   . Hyperlipidemia   . PHN (postherpetic neuralgia)   . GERD (gastroesophageal reflux disease)   . OAB (overactive bladder)    PCP:  Maurice Small, MD Pharmacy:   [place], [place] - [address] [place] [address] [place] [address] [place] [address] Phone: [phone] Fax: [phone]  [facility] [place], [place] [address] [place] [address] Phone: [phone] Fax: [phone]  [facility] [place] , [place] - [place] [address] Phone: [phone] Fax: [phone]  [id number] [place], [place] [address] Phone: [phone] Fax: [phone]  [place], [place] [facility] [place], [address] [place], [address] [place] [address] Phone: [phone] Fax: [phone]     Social Determinants of Health (SDOH) Interventions    Readmission Risk Interventions No flowsheet data found.

## 2020-01-23 NOTE — Progress Notes (Addendum)
Physical Therapy Treatment Patient Details Name: Betty Chan MRN: [id number] DOB: [birth year] Today's Date: 01/23/2020    History of Present Illness Patient is 86 year old female with history of severe aortic stenosis status post TAVR in 2015, breast CA status post lumpectomy, hypertension, hyperlipidemia, hypothyroidism, GERD presented with shortness of breath.Chest x-ray showed pulmonary edema versus atypical infection.  CTA chest negative for PE, showed patchy atelectasis, nonspecific peribronchial thickening and cardiomegaly    PT Comments    The patient appears uncomfortable, listing to left, legs flexed. Assisted with repositioning in the bed.  The  Patient is much less alert than previous encounter during PT. Patient from ALF, was non ambulatory PTA.   Follow Up Recommendations   (return toALF)     Equipment Recommendations  None recommended by PT    Recommendations for Other Services       Precautions / Restrictions Precautions Precautions: Fall Precaution Comments: incontinent of B/B ,check before mobilizing    Mobility  Bed Mobility               General bed mobility comments: repositioned in bed. leaning to left, legs in fetal position. Decreased right knee extension.  Transfers                 General transfer comment: NT  Ambulation/Gait                 Stairs             Wheelchair Mobility    Modified Rankin (Stroke Patients Only)       Balance                                            Cognition Arousal/Alertness: Awake/alert                                     General Comments: much less alert tna previous encounter      Exercises      General Comments        Pertinent Vitals/Pain Faces Pain Scale: Hurts little more Pain Location: right knee with repositioning Pain Descriptors / Indicators: Discomfort;Moaning Pain Intervention(s): Limited activity within patient's  tolerance;Repositioned;Monitored during session    Home Living                      Prior Function            PT Goals (current goals can now be found in the care plan section) Progress towards PT goals: Progressing toward goals    Frequency    Min 2X/week      PT Plan Current plan remains appropriate    Co-evaluation              AM-PAC PT "6 Clicks" Mobility   Outcome Measure  Help needed turning from your back to your side while in a flat bed without using bedrails?: Total Help needed moving from lying on your back to sitting on the side of a flat bed without using bedrails?: Total Help needed moving to and from a bed to a chair (including a wheelchair)?: Total Help needed standing up from a chair using your arms (e.g., wheelchair or bedside chair)?: Total Help needed to walk in hospital room?: Total   6 Click Score: 5  End of Session Equipment Utilized During Treatment: Oxygen Activity Tolerance: Patient limited by fatigue Patient left: in bed;with call bell/phone within reach;with bed alarm set Nurse Communication: Mobility status PT Visit Diagnosis: Unsteadiness on feet (R26.81)     Time: 4471-5806 PT Time Calculation (min) (ACUTE ONLY): 11 min  Charges:  $Therapeutic Activity: 8-22 mins                     Tresa Endo PT Acute Rehabilitation Services Pager [phone] Office [phone]    Claretha Cooper 01/23/2020, 1:43 PM   01/23/20- 1529 Addendum: Patient currently requiring increased assistance than she required  While at ALF. Patient  Can benefit from post acute rehab at SNF level to improve her functional mobility and decrease burden of caregivers. [place] Pager [phone] Office [phone]

## 2020-01-24 DIAGNOSIS — R531 Weakness: Secondary | ICD-10-CM

## 2020-01-24 DIAGNOSIS — Z7189 Other specified counseling: Secondary | ICD-10-CM

## 2020-01-24 DIAGNOSIS — R0600 Dyspnea, unspecified: Secondary | ICD-10-CM

## 2020-01-24 LAB — CBC
HCT: 39.5 % (ref 36.0–46.0)
Hemoglobin: 12.5 g/dL (ref 12.0–15.0)
MCH: 31.3 pg (ref 26.0–34.0)
MCHC: 31.6 g/dL (ref 30.0–36.0)
MCV: 98.8 fL (ref 80.0–100.0)
Platelets: 327 10*3/uL (ref 150–400)
RBC: 4 MIL/uL (ref 3.87–5.11)
RDW: 13.4 % (ref 11.5–15.5)
WBC: 11 10*3/uL — ABNORMAL HIGH (ref 4.0–10.5)
nRBC: 0 % (ref 0.0–0.2)

## 2020-01-24 LAB — BASIC METABOLIC PANEL
Anion gap: 9 (ref 5–15)
BUN: 39 mg/dL — ABNORMAL HIGH (ref 8–23)
CO2: 26 mmol/L (ref 22–32)
Calcium: 9.1 mg/dL (ref 8.9–10.3)
Chloride: 105 mmol/L (ref 98–111)
Creatinine, Ser: 1.1 mg/dL — ABNORMAL HIGH (ref 0.44–1.00)
GFR calc Af Amer: 52 mL/min — ABNORMAL LOW (ref >60)
GFR calc non Af Amer: 45 mL/min — ABNORMAL LOW (ref >60)
Glucose, Bld: 136 mg/dL — ABNORMAL HIGH (ref 70–99)
Potassium: 4.9 mmol/L (ref 3.5–5.1)
Sodium: 140 mmol/L (ref 135–145)

## 2020-01-24 MED ORDER — METRONIDAZOLE 500 MG PO TABS
500.0000 mg | ORAL_TABLET | Freq: Three times a day (TID) | ORAL | Status: AC
  Administered 2020-01-24 – 2020-01-25 (×3): 500 mg via ORAL
  Filled 2020-01-24 (×3): qty 1

## 2020-01-24 NOTE — Progress Notes (Signed)
Daily Progress Note   Patient Name: Betty Chan       Date: 01/24/2020 DOB: 01/24/1930  Age: 86 y.o. MRN#: [id number] Attending Physician: Guilford Shi, MD Primary Care Physician: Maurice Small, MD Admit Date: 01/17/2020  Reason for Consultation/Follow-up: Establishing goals of care  Subjective:  patient is awake alert, in no distress. Discussed with bedside RN, patient was able to eat some, participated with OT some, was able to take her PO medications in apple sauce without difficulty.   Call placed and discussed with daughter, see below.   Length of Stay: 7  Current Medications: Scheduled Meds:  . atorvastatin  20 mg Oral Daily  . cefdinir  300 mg Oral Q12H  . enoxaparin (LOVENOX) injection  40 mg Subcutaneous Q24H  . furosemide  40 mg Oral Daily  . guaiFENesin  600 mg Oral BID  . ipratropium-albuterol  3 mL Nebulization BID  . levothyroxine  50 mcg Oral QAC breakfast  . lisinopril  5 mg Oral Daily  . mouth rinse  15 mL Mouth Rinse BID  . metoprolol succinate  50 mg Oral BID  . metroNIDAZOLE  500 mg Oral Q8H  . mirabegron ER  50 mg Oral Daily  . pantoprazole  40 mg Oral Daily  . potassium chloride  20 mEq Oral TID  . sodium chloride flush  3 mL Intravenous Q12H    Continuous Infusions: . sodium chloride      PRN Meds: sodium chloride, acetaminophen, capsaicin, hydrALAZINE, levalbuterol, sodium chloride flush  Physical Exam         Awake alert No distress Appears frail and weak On 4 L O2 [place] S 1 S 2  Regular work of breathing No edema Has heel ulcer.   Vital Signs: BP 128/76 (BP Location: Left Arm)   Pulse 68   Temp 97.9 F (36.6 C) (Oral)   Resp 16   Ht 5\' 7"  (1.702 m)   Wt 75 kg   SpO2 92%   BMI 25.90 kg/m  SpO2: SpO2: 92 % O2 Device: O2  Device: Nasal Cannula O2 Flow Rate: O2 Flow Rate (L/min): 4 L/min  Intake/output summary:   Intake/Output Summary (Last 24 hours) at 01/24/2020 1309 Last data filed at 01/24/2020 0500 Gross per 24 hour  Intake 780 ml  Output 950 ml  Net -170 ml  LBM: Last BM Date: 01/23/20 Baseline Weight: Weight: 78 kg Most recent weight: Weight: 75 kg       Palliative Assessment/Data: PPS 30%     Patient Active Problem List   Diagnosis Date Noted  . Parkinsonism ([place])   . Palliative care by specialist   . Pressure injury of skin 01/18/2020  . Acute respiratory failure with hypoxia ([place]) 01/17/2020  . Acute exacerbation of CHF (congestive heart failure) ([place]) 01/17/2020  . Gait instability 01/17/2016  . Routine general medical examination at a health care facility 07/20/2015  . Chronic venous insufficiency 01/03/2015  . Elevated troponin 11/24/2014  . Osteoarthritis of right lower extremity 11/13/2014  . Osteopenia 02/13/2014  . Essential hypertension   . History of breast cancer 11/09/2013  . Seizures ([place]) 10/04/2013  . H/O aortic valve replacement TAVR 09/07/2013  . Hypothyroid   . Hyperlipidemia   . PHN (postherpetic neuralgia)   . GERD (gastroesophageal reflux disease)   . OAB (overactive bladder)     Palliative Care Assessment & Plan   Patient Profile:  86 year old female with history of severe aortic stenosis status post TAVR in 2015, breast CA status post lumpectomy, hypertension, hyperlipidemia, hypothyroidism, GERD presented with exertional dyspnea    Assessment:  aspiration PNA, wasn't on home O2, now on 4L O2 [place].  Acute on chronic combined systolic/diastolic CHF E coli UTI.  Parkinson's disease.   Recommendations/Plan:  Call placed and discussed with daughter: patient has caregivers at her facility, and her baseline is such that she was non ambulatory and using her wheel chair. How ever, the patient had strong upper body strength and used to be able to transfer  herself.   We discussed about patient's current condition, her underlying conditions and scope of current hospitalization. Goals, wishes and values important to patient and family attempted to be explored.   We reviewed about differences between hospice and palliative care. Differences between home with hospice, residential hospice and facility with palliative support all discussed. All of the daughter's questions addressed to the best of my ability.   Plan is for the patient to continue with current hospitalization, current scope of care, attempt to wean off O2, continue attempts to improve PO intake, PT etc. Have recommended palliative care following at her facility, daughter is in agreement.   Code Status:    Code Status Orders  (From admission, onward)         Start     Ordered   01/17/20 2029  Full code  Continuous        01/17/20 2030        Code Status History    Date Active Date Inactive Code Status Order ID Comments User Context   11/24/2014 1727 11/26/2014 1657 Full Code [id number]  Karen Kitchens Inpatient   Advance Care Planning Activity    Advance Directive Documentation     Most Recent Value  Type of Advance Directive Living will  Pre-existing out of facility DNR order (yellow form or pink MOST form) --  "MOST" Form in Place? --       Prognosis:   Unable to determine  Discharge Planning:  Recommend palliative care follow up at her facility.   Care plan was discussed with daughter on the phone.   Thank you for allowing the Palliative Medicine Team to assist in the care of this patient.   Time In: 12.30 Time Out: 1305 Total Time 35 Prolonged Time Billed No       Greater  than 50%  of this time was spent counseling and coordinating care related to the above assessment and plan.  Loistine Chance, MD  Please contact Palliative Medicine Team phone at [phone] for questions and concerns.

## 2020-01-24 NOTE — Progress Notes (Signed)
SLP Cancellation Note  Patient Details Name: Betty Chan MRN: [id number] DOB: [birth year] Time of attempt for treatment was 6  Cancelled treatment:       Reason Eval/Treat Not Completed: Other (comment) (attempted to follow up with pt earlier today, upon arriving to room, she needed cleaned - NT present - will continue efforts)  Kathleen Lime, MS [place] Office [phone]  Macario Golds 01/24/2020, 5:27 PM

## 2020-01-24 NOTE — Progress Notes (Signed)
Occupational Therapy Treatment Patient Details Name: Betty Chan MRN: [id number] DOB: 1929/09/08 Today's Date: 01/24/2020    History of present illness Patient is 86 year old female with history of severe aortic stenosis status post TAVR in 2015, breast CA status post lumpectomy, hypertension, hyperlipidemia, hypothyroidism, GERD presented with shortness of breath.Chest x-ray showed pulmonary edema versus atypical infection.  CTA chest negative for PE, showed patchy atelectasis, nonspecific peribronchial thickening and cardiomegaly   OT comments  Patient progressing and showed improved ability to balance at EOB x12 minutes, with 1-2 arm support on bed as needed, compared to previous session where pt tolerating bed level session only. While pt has dropped 1 point in 6-clicks AM-PAC score of ADL performance, this is due to increased challenge from bed level grooming to EOB grooming.  Patient limited by limited activity tolerance, generalized weakness and poor balance and siting posture, along with deficits noted below. Pt is motivated and continues to demonstrate good rehab potential and would benefit from continued skilled OT to increase safety and independence with ADLs and functional transfers to allow pt to return home safely and reduce caregiver burden and fall risk.   Follow Up Recommendations    SNF   Equipment Recommendations       Recommendations for Other Services      Precautions / Restrictions Precautions Precaution Comments: incontinent of B/B ,check before mobilizing Restrictions Weight Bearing Restrictions: No       Mobility Bed Mobility Overal bed mobility: Needs Assistance Bed Mobility: Supine to Sit;Sit to Supine     Supine to sit: Max assist Sit to supine: Max assist;+2 for physical assistance      Transfers  NT               General transfer comment: NT    Balance Overall balance assessment: Needs assistance   Sitting balance-Leahy Scale:  Fair (progressed from initially Poor) Sitting balance - Comments: [place] posture with posterior pelvic tilt. Requires BUE support on bed majority of sitting time. Postural control: Posterior lean                                 ADL either performed or assessed with clinical judgement   ADL Overall ADL's : Needs assistance/impaired Eating/Feeding: Sitting;Moderate assistance Eating/Feeding Details (indicate cue type and reason): Pt assisted in EOB sitting and provided with passive thoracic extension stretch and active assist head to neutral while RN assisted pt with spoonfulls of apple sauce and medications. Pt required BUE support on bed to maintain balance, therefore required RN to feed her.  Once in supine with HOB at 45 degrees, pt able to demo holding a cup and sipping water through straw. Grooming: Sitting;Moderate assistance Grooming Details (indicate cue type and reason): Pt sat EOB and washed face with RT hand, keeping LT hand on bed for balance support.  Hair combed with total assist as pt required returning RUE back to bed for increased support, likely due to fatigue.             Lower Body Dressing: Bed level Lower Body Dressing Details (indicate cue type and reason): Total Assist to adjust socks at bed level.     Toileting- Clothing Manipulation and Hygiene: Total assistance Toileting - Clothing Manipulation Details (indicate cue type and reason): Pt on pure wick. Inconctinent of B/B at baseline.     Functional mobility during ADLs: Maximal assistance General ADL Comments: Please refer to Mobility section.  Vision   Vision Assessment?: No apparent visual deficits   Perception     Praxis      Cognition  Alert and oriented to person, place, DOB. Not formally asked time/situation.                                                           General Comments      Pertinent Vitals/ Pain          Vitals:   01/24/20 0522  01/24/20 0804  BP: 128/76   Pulse: 68   Resp: 16   Temp: 97.9 F (36.6 C)   SpO2: 99% 92%   VSS during OT session per tele monitor.                                                           Frequency  Min 2X/week        Progress Toward Goals  OT Goals(current goals can now be found in the care plan section)     Acute Rehab OT Goals Patient Stated Goal: "To sit up a while." OT Goal Formulation: With patient Time For Goal Achievement: 02/03/20 Potential to Achieve Goals: [place] OT "6 Clicks" Daily Activity     Outcome Measure   Help from another person eating meals?: A Little Help from another person taking care of personal grooming?: A Lot Help from another person toileting, which includes using toliet, bedpan, or urinal?: Total Help from another person bathing (including washing, rinsing, drying)?: A Lot Help from another person to put on and taking off regular upper body clothing?: A Lot Help from another person to put on and taking off regular lower body clothing?: Total 6 Click Score: 11    End of Session    OT Visit Diagnosis: Muscle weakness (generalized) (M62.81)   Activity Tolerance Patient tolerated treatment well   Patient Left in bed;with call bell/phone within reach;with bed alarm set   Nurse Communication Mobility status        Time: 4034-7425 OT Time Calculation (min): 28 min  Charges: OT General Charges $OT Visit: 1 Visit OT Treatments $Self Care/Home Management : 8-22 mins $Therapeutic Activity: 8-22 mins  Anderson [place], [place] Office: [phone] 01/24/2020    Julien Girt 01/24/2020, 9:22 AM

## 2020-01-24 NOTE — Progress Notes (Signed)
PT demonstrated hands on understanding of Flutter device. PC at this time. 

## 2020-01-24 NOTE — Plan of Care (Signed)
  Problem: Education: Goal: Ability to demonstrate management of disease process will improve Outcome: Progressing Goal: Ability to verbalize understanding of medication therapies will improve Outcome: Progressing   

## 2020-01-24 NOTE — Progress Notes (Addendum)
PROGRESS NOTE    Betty Chan  NWG:[id number]  DOB: [birth year]  PCP: Maurice Small, MD [place] date:01/17/2020 Chief compliant: Dyspnea Hospital course:86 year old female with history of severe aortic stenosis status post TAVR in 2015, breast CA status post lumpectomy, hypertension, hyperlipidemia, hypothyroidism, GERD presented with exertional dyspnea  for 5 days prior to admission associated with coughing and ankle swelling. Denies prior history of COPD or CHF.Chest x-ray showed pulmonary edema versus atypical infection.CTA chest negative for PE, showed patchy atelectasis, nonspecific peribronchial thickening and cardiomegaly, BNP elevated 504. Patient admitted to [facility] for further evaluation and management.  Subjective:  Patient appears much more comfortable with improved upper airway sounds/secretions and in terms of respiratory effort.  Still requiring 4 L O2.  Objective: Vitals:   01/23/20 2303 01/23/20 2305 01/24/20 0522 01/24/20 0804  BP: (!) 130/105  128/76   Pulse: (!) 125  68   Resp: 20 19 16    Temp: 98.2 F (36.8 C)  97.9 F (36.6 C)   TempSrc: Oral  Oral   SpO2: 99%  99% 92%  Weight:   75 kg   Height:        Intake/Output Summary (Last 24 hours) at 01/24/2020 1232 Last data filed at 01/24/2020 0500 Gross per 24 hour  Intake 780 ml  Output 950 ml  Net -170 ml   Filed Weights   01/22/20 0500 01/23/20 0500 01/24/20 0522  Weight: 73.3 kg 76.3 kg 75 kg    Physical Examination:  General: Elderly female who appears more comfortable today, on 4 L O2 nasal cannula Heart: S1-S2 heard, regular rate and rhythm, no murmurs.  No leg edema noted Lungs: Equal air entry bilaterally, improved upper airway sounds/rhonchi.  No significant rales on exam, no accessory muscle use today Abdomen: Bowel sounds heard, soft, nontender, nondistended. No organomegaly.  No CVA tenderness Extremities: No pedal edema.  No cyanosis or clubbing. Neurological: Awake, oriented x3, no focal  weakness but he appears very deconditioned with generalized weakness, overall decreased strength in extremities. Skin: Stage I right heel ulcer  Data Reviewed: I have personally reviewed following labs and imaging studies  CBC: Recent Labs  Lab 01/17/20 1500 01/19/20 0421 01/23/20 0502 01/24/20 0446  WBC 5.3 9.3 9.5 11.0*  NEUTROABS 3.8  --   --   --   HGB 12.1 12.3 12.0 12.5  HCT 38.3 37.2 38.4 39.5  MCV 99.5 94.7 99.7 98.8  PLT 215 225 315 578   Basic Metabolic Panel: Recent Labs  Lab 01/18/20 0429 01/19/20 0421 01/20/20 0440 01/20/20 0440 01/21/20 0453 01/22/20 0515 01/22/20 1738 01/23/20 0502 01/24/20 0446  NA 135   < > 134*  --  138 142  --  141 140  K 3.9   < > 3.4*   < > 3.2* 3.9 4.3 4.4 4.9  CL 98   < > 96*  --  99 103  --  106 105  CO2 28   < > 26  --  27 26  --  26 26  GLUCOSE 116*   < > 109*  --  124* 146*  --  114* 136*  BUN 22   < > 33*  --  38* 29*  --  33* 39*  CREATININE 0.87   < > 1.10*  --  1.04* 0.88  --  1.04* 1.10*  CALCIUM 8.6*   < > 8.8*  --  8.8* 9.0  --  8.8* 9.1  MG 1.8  --   --   --   --   --  1.9  --   --    < > = values in this interval not displayed.   GFR: Estimated Creatinine Clearance: 38.1 mL/min (A) (by C-G formula based on SCr of 1.1 mg/dL (H)). Liver Function Tests: Recent Labs  Lab 01/17/20 1500  AST 28  ALT 26  ALKPHOS 56  BILITOT 0.7  PROT 7.6  ALBUMIN 3.9   No results for input(s): LIPASE, AMYLASE in the last 168 hours. No results for input(s): AMMONIA in the last 168 hours. Coagulation Profile: No results for input(s): INR, PROTIME in the last 168 hours. Cardiac Enzymes: No results for input(s): CKTOTAL, CKMB, CKMBINDEX, TROPONINI in the last 168 hours. BNP (last 3 results) No results for input(s): PROBNP in the last 8760 hours. HbA1C: No results for input(s): HGBA1C in the last 72 hours. CBG: No results for input(s): GLUCAP in the last 168 hours. Lipid Profile: No results for input(s): CHOL, HDL, LDLCALC,  TRIG, CHOLHDL, LDLDIRECT in the last 72 hours. Thyroid Function Tests: No results for input(s): TSH, T4TOTAL, FREET4, T3FREE, THYROIDAB in the last 72 hours. Anemia Panel: No results for input(s): VITAMINB12, FOLATE, FERRITIN, TIBC, IRON, RETICCTPCT in the last 72 hours. Sepsis Labs: Recent Labs  Lab 01/18/20 0429 01/19/20 0421 01/20/20 0440  PROCALCITON 0.66 5.85 4.31    Recent Results (from the past 240 hour(s))  SARS Coronavirus 2 by RT PCR (hospital order, performed in [facility] hospital lab) Nasopharyngeal Nasopharyngeal Swab     Status: None   Collection Time: 01/17/20  3:12 PM   Specimen: Nasopharyngeal Swab  Result Value Ref Range Status   SARS Coronavirus 2 NEGATIVE NEGATIVE Final    Comment: (NOTE) SARS-CoV-2 target nucleic acids are NOT DETECTED.  The SARS-CoV-2 RNA is generally detectable in upper and lower respiratory specimens during the acute phase of infection. The lowest concentration of SARS-CoV-2 viral copies this assay can detect is 250 copies / mL. A negative result does not preclude SARS-CoV-2 infection and should not be used as the sole basis for treatment or other patient management decisions.  A negative result may occur with improper specimen collection / handling, submission of specimen other than nasopharyngeal swab, presence of viral mutation(s) within the areas targeted by this assay, and inadequate number of viral copies (<250 copies / mL). A negative result must be combined with clinical observations, patient history, and epidemiological information.  Fact Sheet for Patients:   StrictlyIdeas.no  Fact Sheet for Healthcare Providers: BankingDealers.co.za  This test is not yet approved or  cleared by the [place] FDA and has been authorized for detection and/or diagnosis of SARS-CoV-2 by FDA under an Emergency Use Authorization (EUA).  This EUA will remain in effect (meaning this test can be  used) for the duration of the COVID-19 declaration under Section 564(b)(1) of the Act, 21 U.S.C. section 360bbb-3(b)(1), unless the authorization is terminated or revoked sooner.  Performed at [facility], [place] [address] [place], [place] [address]   Urine Culture     Status: Abnormal   Collection Time: 01/17/20  9:30 PM   Specimen: Urine, Random  Result Value Ref Range Status   Specimen Description   Final    URINE, RANDOM Performed at [facility] [address] [place], [place] [address]    Special Requests   Final    NONE Performed at [facility], [place] [address] [place], [place] [address]    Culture >=100,000 COLONIES/mL ESCHERICHIA COLI (A)  Final   Report  Status 01/20/2020 FINAL  Final   Organism ID, Bacteria ESCHERICHIA COLI (A)  Final      Susceptibility   Escherichia coli - MIC*    AMPICILLIN 8 SENSITIVE Sensitive     CEFAZOLIN <=4 SENSITIVE Sensitive     CEFTRIAXONE <=0.25 SENSITIVE Sensitive     CIPROFLOXACIN <=0.25 SENSITIVE Sensitive     GENTAMICIN <=1 SENSITIVE Sensitive     IMIPENEM <=0.25 SENSITIVE Sensitive     NITROFURANTOIN 32 SENSITIVE Sensitive     TRIMETH/SULFA <=20 SENSITIVE Sensitive     AMPICILLIN/SULBACTAM 4 SENSITIVE Sensitive     PIP/TAZO <=4 SENSITIVE Sensitive     * >=100,000 COLONIES/mL ESCHERICHIA COLI  MRSA PCR Screening     Status: None   Collection Time: 01/17/20 11:33 PM   Specimen: Nasal Mucosa; Nasopharyngeal  Result Value Ref Range Status   MRSA by PCR NEGATIVE NEGATIVE Final    Comment:        The GeneXpert MRSA Assay (FDA approved for NASAL specimens only), is one component of a comprehensive MRSA colonization surveillance program. It is not intended to diagnose MRSA infection nor to guide or monitor treatment for MRSA infections. Performed at [facility], [place] [address], [place], [place] [address]       Radiology Studies: No results  found.    Scheduled Meds: . atorvastatin  20 mg Oral Daily  . cefdinir  300 mg Oral Q12H  . enoxaparin (LOVENOX) injection  40 mg Subcutaneous Q24H  . furosemide  40 mg Oral Daily  . guaiFENesin  600 mg Oral BID  . ipratropium-albuterol  3 mL Nebulization BID  . levothyroxine  50 mcg Oral QAC breakfast  . lisinopril  5 mg Oral Daily  . mouth rinse  15 mL Mouth Rinse BID  . metoprolol succinate  50 mg Oral BID  . mirabegron ER  50 mg Oral Daily  . pantoprazole  40 mg Oral Daily  . potassium chloride  20 mEq Oral TID  . sodium chloride flush  3 mL Intravenous Q12H   Continuous Infusions: . sodium chloride    . metronidazole 500 mg (01/24/20 0844)      Assessment/Plan:  Acute hypoxic respiratory failure: Present on admission Secondary to acute on chronic combined CHF as well as aspiration pneumonia.  Still requiring 4 L O2, continue treatment as described below and taper O2 to off.  Acute on chronic combined systolic/diastolic CHF: Patient has history of severe aortic stenosis status post TAVR, echo in 2016 ([facility]) showed EF 13%, grade 1 diastolic dysfunction.  2D echo showed EF of 25 to 30% with global hypokinesis, left ventricular severely decreased function.  Patient  diuresed with IV Lasix 40 mg q12 hours, now has been transitioned to oral 40 mg daily.  Patient had mildly elevated troponin and seen by cardiology, no further work-up recommended since cardiac cath in 2015 had showed no significant CAD.  Per cardiology, continue with the low-dose ACE inhibitor, Toprol-XL increased to 50 mg twice daily, Lasix 40 mg daily, signed off 8/1  Aspiration pneumonia: Reviewed prior records and discussed with daughter.  It appears that patient underwent fluoroscopic swallow evaluation in 2016 when she was felt to have mild abnormality with retention of liquids around vallecula.  Seen by speech therapy in this admission and placed on dysphagia 3 diet initially however continue to have coarse breath  sounds and changed to dysphagia 2 diet.  Modified barium swallow in this admission also revealed mild aspiration risk and difficulty swallowing  medications/requiring frequent cueing.  Now off IV Vancomycin as MRSA PCR negative.  Currently on day 6 of cephalosporin/Flagyl.  I did discuss with daughter extensively yesterday regarding patient's clinical picture being consistent with aspiration bronchitis/pneumonia and possibility of recurrence/her not being an ideal candidate for long-term feeding tubes with multiple comorbidities and advanced age.  Daughter understands.  Fortunately patient appears to have turned around with scheduled nebulizer treatments, Acapella and frequent suctioning.  Has mild leukocytosis today, will change Flagyl to p.o. and can consider discontinuing in a.m.   E. coli UTI-UA showed positive nitrite, leukocytes, many bacteria, > 50 WBC, urine culture growing pansensitive E. Coli.  Received IV Rocephin x 5 days-- transitioned yesterday to oral antibiotics, Omnicef for 1 more day.  Hypothyroidism-Continue Synthroid, TSH 1.6, previously 4.79  Essential hypertension-Continue Lasix, beta-blocker, ACE inhibitor  History of aortic stenosis status post TAVR in 2015-2D echo as above  Pressure injury of skin-Right heel stage I, POA.  Continue local wound care.  Generalized debility, Parkinson's disease - per daughter, wheel chair bound for last 4-5 years, otherwise cognitively intact, has a hard time vocalizing (whispering is chronic for the patient per daughter).  Seen by palliative care team during this hospitalization regarding care goals.  Remains full code. -PT OT evaluation done,previously recommended return to ALF with 24/7 caregivers but noted that OT today recommending SNF      DVT prophylaxis: Lovenox Code Status: Remains full code Family / Patient Communication: Discussed with patient, updated daughter [name]) yesterday and will updated today. Disposition Plan:     Status is: Inpatient  Remains inpatient appropriate because:IV treatments appropriate due to intensity of illness or inability to take PO and Inpatient level of care appropriate due to severity of illness  Dispo: The patient is from: [facility]F-[facility]              Anticipated d/c is to: ALF VS SNF-f/u PT notes in am              Anticipated d/c date is: 2-3 days if respiratory status remained stable and can be tapered off O2.              Patient currently is not medically stable to d/c.  Time spent: 35  minutes >50% time spent in discussions with care team and coordination of care.    Guilford Shi, MD [facility] Pager in [place]  If 7PM-7AM, please contact night-coverage [email or web address] 01/24/2020, 12:32 PM

## 2020-01-25 LAB — CBC
HCT: 39.9 % (ref 36.0–46.0)
Hemoglobin: 12.4 g/dL (ref 12.0–15.0)
MCH: 30.8 pg (ref 26.0–34.0)
MCHC: 31.1 g/dL (ref 30.0–36.0)
MCV: 99 fL (ref 80.0–100.0)
Platelets: 342 10*3/uL (ref 150–400)
RBC: 4.03 MIL/uL (ref 3.87–5.11)
RDW: 13.3 % (ref 11.5–15.5)
WBC: 10.8 10*3/uL — ABNORMAL HIGH (ref 4.0–10.5)
nRBC: 0 % (ref 0.0–0.2)

## 2020-01-25 LAB — BASIC METABOLIC PANEL
Anion gap: 10 (ref 5–15)
BUN: 36 mg/dL — ABNORMAL HIGH (ref 8–23)
CO2: 23 mmol/L (ref 22–32)
Calcium: 9 mg/dL (ref 8.9–10.3)
Chloride: 105 mmol/L (ref 98–111)
Creatinine, Ser: 1.11 mg/dL — ABNORMAL HIGH (ref 0.44–1.00)
GFR calc Af Amer: 52 mL/min — ABNORMAL LOW (ref >60)
GFR calc non Af Amer: 45 mL/min — ABNORMAL LOW (ref >60)
Glucose, Bld: 122 mg/dL — ABNORMAL HIGH (ref 70–99)
Potassium: 4.8 mmol/L (ref 3.5–5.1)
Sodium: 138 mmol/L (ref 135–145)

## 2020-01-25 MED ORDER — SALINE SPRAY 0.65 % NA SOLN
2.0000 | NASAL | Status: DC | PRN
  Filled 2020-01-25: qty 44

## 2020-01-25 NOTE — Progress Notes (Addendum)
PROGRESS NOTE  Betty Chan  YQM:[id number] DOB: January 05, 1930 DOA: 01/17/2020 PCP: Maurice Small, MD   Brief Narrative: Betty Chan is an 86 year old female with history of AS s/p TAVR in 2015, breast CA s/p lumpectomy, HTN, HLD, hypothyroidism, GERD, and progressive supranuclear palsy who presented with exertional dyspnea  for 5 days prior to admission associated with coughing and ankle swelling.CXR showed pulmonary edema versus atypical infection. CTA chest negative for PE, showed patchy atelectasis, nonspecific peribronchial thickening and cardiomegaly, BNP elevated 504. She was admitted presumed CHF exacerbation confirmed to have LVEF 25-30%, also felt to have aspiration pneumonia and continues to be at high risk for aspiration. Cardiology was consulted, oversaw diuresis, which has been converted to oral regimen. Palliative care consulted, confirmed patient and family desire full aggressive measures but are ok with feeding with mitigation measures. She continues to require 4L O2 despite no history of hypoxemia.   Assessment & Plan: Principal Problem:   Acute exacerbation of CHF (congestive heart failure) (HCC) Active Problems:   Hyperlipidemia   Essential hypertension   Elevated troponin   Acute respiratory failure with hypoxia (HCC)   Pressure injury of skin   Parkinsonism ([place])   Palliative care by specialist   Dyspnea   Goals of care, counseling/discussion   General weakness  Acute hypoxemic respiratory failure: Due to CHF and aspiration pneumonia. Remains despite Tx CHF. Suspect ongoing aspiration contributing to pneumonitis.  - Aspiration precautions - Wean oxygen as tolerated - Pulmonary toilet - Monitor fever curve, initiate abx if needed.  - CXR in AM - Maintain euvolemia.   Acute on chronic combined HFrEF: Patient has history of severe aortic stenosis status post TAVR, echo in 2016 ([facility]) showed EF 52%, grade 1 diastolic dysfunction.  2D echo showed EF of 25 to 30%  with global hypokinesis, left ventricular severely decreased function.  Patient diuresed with IV Lasix 40 mgq12 hours, now has been transitioned to oral 40 mg daily. - Continue po lasix, low-dose ACEi, metoprolol increased.  - Follow up with cardiology as an outpatient. Signed off 8/1.  Aspiration pneumonia: Reviewed prior records and discussed with daughter.  It appears that patient underwent fluoroscopic swallow evaluation in 2016 when she was felt to have mild abnormality with retention of liquids around vallecula.  Seen by speech therapy in this admission and placed on dysphagia 3 diet initially however continue to have coarse breath sounds and changed to dysphagia 2 diet.  Modified barium swallow in this admission also revealed mild aspiration risk and difficulty swallowing medications/requiring frequent cueing. - Complete ceftriaxone, flagyl. Stopped vancomycin w/negative MRSA PCR.  - SLP evaluations ongoing. - Continue nebulizer therapies  Troponin elevation:  - No investigation currently planned per cardiology, no significant CAD on LHC 2015.  Progressive supranuclear palsy: With progressive debility. Wheelchair bound though now unable to assist with transfer.  - SNF disposition planned. Full measures desired per palliative care  E. coli UTI: Completed abx  Hypothyroidism: TSH 1.6.  - Continue synthroid.  RN Pressure Injury Documentation: Pressure Injury 01/17/20 Heel Right Stage 1 -  Intact skin with non-blanchable redness of a localized area usually over a bony prominence. Right Heel stage 1 pressure injury (Active)  01/17/20 2325  Location: Heel  Location Orientation: Right  Staging: Stage 1 -  Intact skin with non-blanchable redness of a localized area usually over a bony prominence.  Wound Description (Comments): Right Heel stage 1 pressure injury  Present on Admission: Yes   DVT prophylaxis: Lovenox Code Status: Full Family Communication:  Daughter by phone this  evening Disposition Plan:  Status is: Inpatient  Remains inpatient appropriate because:Inpatient level of care appropriate due to severity of illness, remains newly hypoxemic.   Dispo: The patient is from: [place]              Anticipated d/c is to: SNF              Anticipated d/c date is: 1 day              Patient currently is not medically stable to d/c.  Consultants:   Palliative care  Cardiology  Procedures:   Echocardiogram  Barium Swallow Study  Antimicrobials:  Vancomycin 7/29 - 7/31  Ceftriaxone 7/28 - 8/1  Cefdinir 8/2 - 8/4  Flagyl 7/29 - 8/4  Subjective: No complaints, but when asked, does have some shortness of breath. Continues to have coarse breath sounds reported by RN, unable to effectively cough. Feels weak. Eating some with assistance. No chest pain.   Objective: Vitals:   01/25/20 0555 01/25/20 0600 01/25/20 0852 01/25/20 1250  BP:    105/61  Pulse:    73  Resp:    20  Temp:    98.2 F (36.8 C)  TempSrc:    Oral  SpO2: (!) 83% 93% 94% 100%  Weight:      Height:        Intake/Output Summary (Last 24 hours) at 01/25/2020 1945 Last data filed at 01/25/2020 1659 Gross per 24 hour  Intake 215 ml  Output 1200 ml  Net -985 ml   Filed Weights   01/22/20 0500 01/23/20 0500 01/24/20 0522  Weight: 73.3 kg 76.3 kg 75 kg    Gen: Elderly, interactive, weak appearing female in no distress Pulm: Non-labored breathing 4L O2. Significant upper airway transmitted sounds throughout, no definite lower wheezing or crackles..  CV: Regular rate and rhythm. No murmur, rub, or gallop. No JVD, no pitting pedal edema. GI: Abdomen soft, non-tender, non-distended, with normoactive bowel sounds. No organomegaly or masses felt. Ext: Warm, no deformities Skin: No rashes, lesions or ulcers on visuzlied skin Neuro: Alert and oriented. Dysphonia. No focal neurological deficits. Psych: Judgement and insight appear normal. Mood & affect appropriate.    Data Reviewed: I have personally reviewed following labs and imaging studies  CBC: Recent Labs  Lab 01/19/20 0421 01/23/20 0502 01/24/20 0446 01/25/20 0459  WBC 9.3 9.5 11.0* 10.8*  HGB 12.3 12.0 12.5 12.4  HCT 37.2 38.4 39.5 39.9  MCV 94.7 99.7 98.8 99.0  PLT 225 315 327 614   Basic Metabolic Panel: Recent Labs  Lab 01/21/20 0453 01/21/20 0453 01/22/20 0515 01/22/20 1738 01/23/20 0502 01/24/20 0446 01/25/20 0459  NA 138  --  142  --  141 140 138  K 3.2*   < > 3.9 4.3 4.4 4.9 4.8  CL 99  --  103  --  106 105 105  CO2 27  --  26  --  26 26 23   GLUCOSE 124*  --  146*  --  114* 136* 122*  BUN 38*  --  29*  --  33* 39* 36*  CREATININE 1.04*  --  0.88  --  1.04* 1.10* 1.11*  CALCIUM 8.8*  --  9.0  --  8.8* 9.1 9.0  MG  --   --   --  1.9  --   --   --    < > = values in this interval not displayed.  GFR: Estimated Creatinine Clearance: 37.8 mL/min (A) (by C-G formula based on SCr of 1.11 mg/dL (H)). Liver Function Tests: No results for input(s): AST, ALT, ALKPHOS, BILITOT, PROT, ALBUMIN in the last 168 hours. No results for input(s): LIPASE, AMYLASE in the last 168 hours. No results for input(s): AMMONIA in the last 168 hours. Coagulation Profile: No results for input(s): INR, PROTIME in the last 168 hours. Cardiac Enzymes: No results for input(s): CKTOTAL, CKMB, CKMBINDEX, TROPONINI in the last 168 hours. BNP (last 3 results) No results for input(s): PROBNP in the last 8760 hours. HbA1C: No results for input(s): HGBA1C in the last 72 hours. CBG: No results for input(s): GLUCAP in the last 168 hours. Lipid Profile: No results for input(s): CHOL, HDL, LDLCALC, TRIG, CHOLHDL, LDLDIRECT in the last 72 hours. Thyroid Function Tests: No results for input(s): TSH, T4TOTAL, FREET4, T3FREE, THYROIDAB in the last 72 hours. Anemia Panel: No results for input(s): VITAMINB12, FOLATE, FERRITIN, TIBC, IRON, RETICCTPCT in the last 72 hours. Urine analysis:    Component  Value Date/Time   COLORURINE YELLOW 01/17/2020 2130   APPEARANCEUR HAZY (A) 01/17/2020 2130   LABSPEC 1.010 01/17/2020 2130   PHURINE 5.5 01/17/2020 2130   GLUCOSEU NEGATIVE 01/17/2020 2130   HGBUR TRACE (A) 01/17/2020 2130   BILIRUBINUR NEGATIVE 01/17/2020 2130   KETONESUR NEGATIVE 01/17/2020 2130   PROTEINUR NEGATIVE 01/17/2020 2130   UROBILINOGEN 1.0 11/25/2014 0220   NITRITE POSITIVE (A) 01/17/2020 2130   LEUKOCYTESUR SMALL (A) 01/17/2020 2130   Recent Results (from the past 240 hour(s))  SARS Coronavirus 2 by RT PCR (hospital order, performed in [facility] hospital lab) Nasopharyngeal Nasopharyngeal Swab     Status: None   Collection Time: 01/17/20  3:12 PM   Specimen: Nasopharyngeal Swab  Result Value Ref Range Status   SARS Coronavirus 2 NEGATIVE NEGATIVE Final    Comment: (NOTE) SARS-CoV-2 target nucleic acids are NOT DETECTED.  The SARS-CoV-2 RNA is generally detectable in upper and lower respiratory specimens during the acute phase of infection. The lowest concentration of SARS-CoV-2 viral copies this assay can detect is 250 copies / mL. A negative result does not preclude SARS-CoV-2 infection and should not be used as the sole basis for treatment or other patient management decisions.  A negative result may occur with improper specimen collection / handling, submission of specimen other than nasopharyngeal swab, presence of viral mutation(s) within the areas targeted by this assay, and inadequate number of viral copies (<250 copies / mL). A negative result must be combined with clinical observations, patient history, and epidemiological information.  Fact Sheet for Patients:   StrictlyIdeas.no  Fact Sheet for Healthcare Providers: BankingDealers.co.za  This test is not yet approved or  cleared by the [place] FDA and has been authorized for detection and/or diagnosis of SARS-CoV-2 by FDA under an Emergency Use  Authorization (EUA).  This EUA will remain in effect (meaning this test can be used) for the duration of the COVID-19 declaration under Section 564(b)(1) of the Act, 21 U.S.C. section 360bbb-3(b)(1), unless the authorization is terminated or revoked sooner.  Performed at [facility], [place] [address], [place], [place] [address]   Urine Culture     Status: Abnormal   Collection Time: 01/17/20  9:30 PM   Specimen: Urine, Random  Result Value Ref Range Status   Specimen Description   Final    URINE, RANDOM Performed at [place] [address], [place],  [address]    Special  Requests   Final    NONE Performed at [facility], [place] [address], [place], [place] [address]    Culture >=100,000 COLONIES/mL ESCHERICHIA COLI (A)  Final   Report Status 01/20/2020 FINAL  Final   Organism ID, Bacteria ESCHERICHIA COLI (A)  Final      Susceptibility   Escherichia coli - MIC*    AMPICILLIN 8 SENSITIVE Sensitive     CEFAZOLIN <=4 SENSITIVE Sensitive     CEFTRIAXONE <=0.25 SENSITIVE Sensitive     CIPROFLOXACIN <=0.25 SENSITIVE Sensitive     GENTAMICIN <=1 SENSITIVE Sensitive     IMIPENEM <=0.25 SENSITIVE Sensitive     NITROFURANTOIN 32 SENSITIVE Sensitive     TRIMETH/SULFA <=20 SENSITIVE Sensitive     AMPICILLIN/SULBACTAM 4 SENSITIVE Sensitive     PIP/TAZO <=4 SENSITIVE Sensitive     * >=100,000 COLONIES/mL ESCHERICHIA COLI  MRSA PCR Screening     Status: None   Collection Time: 01/17/20 11:33 PM   Specimen: Nasal Mucosa; Nasopharyngeal  Result Value Ref Range Status   MRSA by PCR NEGATIVE NEGATIVE Final    Comment:        The GeneXpert MRSA Assay (FDA approved for NASAL specimens only), is one component of a comprehensive MRSA colonization surveillance program. It is not intended to diagnose MRSA infection nor to guide or monitor treatment for MRSA infections. Performed at [facility], [place]  [address], [place], [place] [address]       Radiology Studies: No results found.  Scheduled Meds: . atorvastatin  20 mg Oral Daily  . cefdinir  300 mg Oral Q12H  . enoxaparin (LOVENOX) injection  40 mg Subcutaneous Q24H  . furosemide  40 mg Oral Daily  . guaiFENesin  600 mg Oral BID  . ipratropium-albuterol  3 mL Nebulization BID  . levothyroxine  50 mcg Oral QAC breakfast  . lisinopril  5 mg Oral Daily  . mouth rinse  15 mL Mouth Rinse BID  . metoprolol succinate  50 mg Oral BID  . mirabegron ER  50 mg Oral Daily  . pantoprazole  40 mg Oral Daily  . potassium chloride  20 mEq Oral TID  . sodium chloride flush  3 mL Intravenous Q12H   Continuous Infusions: . sodium chloride       LOS: 8 days   Time spent: 25 minutes.  Patrecia Pour, MD [facility] [email or web address] 01/25/2020, 7:45 PM

## 2020-01-25 NOTE — Plan of Care (Signed)
  Problem: Pain Managment: Goal: General experience of comfort will improve Outcome: Progressing   Problem: Safety: Goal: Ability to remain free from injury will improve Outcome: Progressing   

## 2020-01-25 NOTE — Progress Notes (Signed)
PT demonstrated hands on understanding of Flutter device. PC at this time. 

## 2020-01-25 NOTE — Plan of Care (Signed)
?  Problem: Education: ?Goal: Ability to demonstrate management of disease process will improve ?Outcome: Progressing ?  ?

## 2020-01-25 NOTE — Progress Notes (Signed)
Speech Language Pathology Treatment: Dysphagia  Patient Details Name: Trezure Cronk MRN: [id number] DOB: 09-May-1930 Today's Date: 01/25/2020 Time:  -     Assessment / Plan / Recommendation Clinical Impression  SLP initited therapeutic treatment including RMST *Respiratory Muscle Strength Training* using Respironics PEP device after obtaining approval from MD.  Pt willing to partcipate and after SLP repositioned her, she performed 7 repetitions at lowest setting on device.  Cueing waned from moderate verbal/visual cues initially to min- mod I at end of session.  She advised toward end of repetition, she was getting tired.  Congested cough x3 noted during exercise but unfortunately pt did not expectorate/clear.    Advised her to continue to drink her water to stay hydrated and thin her secretions to help pulmonary clearance. SlP left suction within pt's reach for her use.  Encouraged pt to complete 7 repetitions TID to maximize her respiratory, phonatory, swallowing rehabilitation with return demonstration.  Pt performed exercises well and was motivated!.  Pt agreeable to plan and SLP left device and information in a bag for pt and family - requested RN assist pt to complete exercises twice more today if able.    Pt then agreeable to consume her magic cup, applesauce and water (remainder of hot food on tray was cold).  With intake of water there were NO s/s of aspiration - however after few boluses of viscous Magic Cup, pt started coughing.  Cued her to cough to clear and after cleared provided water to aid clearance.  Pt consumed nearly all of her Magic Cup taking small bites, approximately 8 ounces water and a few bites of applesauce.   Recommend contiue dysphagia diet with precautions.  Given pt's potential progressive neurological disease (Parkinson, PSP), dysphonia, poor cough mechanics including gross weakness and bed bound status, her aspiration, aspiration pna and malnutrition risk remain  elevated. Daughter and pt were informed to aspiration risk during session 2 days prior and were agreeable to po diet with MITIGATION compensations only.    Will continue to follow for family education and RMST to maximize swallowing, pulmonary clearance rehab potential.    HPI HPI: 86yo female admitted 01/17/20 with SOB, cough, BLE edema - suspect CHF exacerbation. PMH: severe aortic stenosis s/p TAVR (2015) breast cancer, HTN, HLD, hypothyroidism, GERD, OA, osteopenia, seizure, TIA (2007). CXR = no evidence of acute disease.  Pt has h/o PSP vs Parkinsonism gleaned from prior [facility] 2019 report.  Her daughter reports pt's voice and cough has been weak chronically.  Pt has good appetite PTA and does not cough with intake per family.  Daughter also reports pt has a decayed tooth that was diagnosed aprox 3 weeks ago - plan is for extraction in the future. Last CXR showed low lung volumes.      SLP Plan  Continue with current plan of care       Recommendations  Medication Administration: Via alternative means Supervision: Patient able to self feed Compensations: Slow rate;Small sips/bites Postural Changes and/or Swallow Maneuvers: Seated upright 90 degrees                Oral Care Recommendations: Oral care prior to ice chip/H20;Oral care QID;Staff/trained caregiver to provide oral care Follow up Recommendations: 24 hour supervision/assistance SLP Visit Diagnosis: Dysphagia, unspecified (R13.10) Plan: Continue with current plan of care       [place], Maricsa Sammons Ann 01/25/2020, 5:37 PM   [place]  Raliegh Ip, [place] Office [phone]

## 2020-01-26 ENCOUNTER — Inpatient Hospital Stay (HOSPITAL_COMMUNITY): Payer: Medicare Other

## 2020-01-26 LAB — CBC WITH DIFFERENTIAL/PLATELET
Abs Immature Granulocytes: 0.2 10*3/uL — ABNORMAL HIGH (ref 0.00–0.07)
Basophils Absolute: 0.1 10*3/uL (ref 0.0–0.1)
Basophils Relative: 1 %
Eosinophils Absolute: 0.3 10*3/uL (ref 0.0–0.5)
Eosinophils Relative: 3 %
HCT: 41.4 % (ref 36.0–46.0)
Hemoglobin: 13.3 g/dL (ref 12.0–15.0)
Immature Granulocytes: 2 %
Lymphocytes Relative: 9 %
Lymphs Abs: 1 10*3/uL (ref 0.7–4.0)
MCH: 31.4 pg (ref 26.0–34.0)
MCHC: 32.1 g/dL (ref 30.0–36.0)
MCV: 97.6 fL (ref 80.0–100.0)
Monocytes Absolute: 0.9 10*3/uL (ref 0.1–1.0)
Monocytes Relative: 9 %
Neutro Abs: 8.4 10*3/uL — ABNORMAL HIGH (ref 1.7–7.7)
Neutrophils Relative %: 76 %
Platelets: 357 10*3/uL (ref 150–400)
RBC: 4.24 MIL/uL (ref 3.87–5.11)
RDW: 13.5 % (ref 11.5–15.5)
WBC: 10.9 10*3/uL — ABNORMAL HIGH (ref 4.0–10.5)
nRBC: 0 % (ref 0.0–0.2)

## 2020-01-26 LAB — BASIC METABOLIC PANEL
Anion gap: 9 (ref 5–15)
BUN: 34 mg/dL — ABNORMAL HIGH (ref 8–23)
CO2: 25 mmol/L (ref 22–32)
Calcium: 9 mg/dL (ref 8.9–10.3)
Chloride: 101 mmol/L (ref 98–111)
Creatinine, Ser: 1.1 mg/dL — ABNORMAL HIGH (ref 0.44–1.00)
GFR calc Af Amer: 52 mL/min — ABNORMAL LOW (ref >60)
GFR calc non Af Amer: 45 mL/min — ABNORMAL LOW (ref >60)
Glucose, Bld: 144 mg/dL — ABNORMAL HIGH (ref 70–99)
Potassium: 4.8 mmol/L (ref 3.5–5.1)
Sodium: 135 mmol/L (ref 135–145)

## 2020-01-26 LAB — D-DIMER, QUANTITATIVE: D-Dimer, Quant: 1.36 ug/mL-FEU — ABNORMAL HIGH (ref 0.00–0.50)

## 2020-01-26 NOTE — NC FL2 (Signed)
[place] LEVEL OF CARE SCREENING TOOL     IDENTIFICATION  Patient Name: Betty Chan Birthdate: 1930/05/28 Sex: female Admission Date (Current Location): 01/17/2020  [facility] and [place] Number:  Herbalist and Address:  [facility],  [place], [place]      Provider Number: [id number]  Attending Physician Name and Address:  Patrecia Pour, MD  Relative Name and Phone Number:     [name] Daughter   [phone]   [facility] Relative   [phone]     Current Level of Care: Hospital Recommended Level of Care: [place] Prior Approval Number:    Date Approved/Denied:   PASRR Number: [id number]  Discharge Plan: SNF    Current Diagnoses: Patient Active Problem List   Diagnosis Date Noted  . Dyspnea   . Goals of care, counseling/discussion   . General weakness   . Parkinsonism ([place])   . Palliative care by specialist   . Pressure injury of skin 01/18/2020  . Acute respiratory failure with hypoxia ([place]) 01/17/2020  . Acute exacerbation of CHF (congestive heart failure) ([place]) 01/17/2020  . Gait instability 01/17/2016  . Routine general medical examination at a health care facility 07/20/2015  . Chronic venous insufficiency 01/03/2015  . Elevated troponin 11/24/2014  . Osteoarthritis of right lower extremity 11/13/2014  . Osteopenia 02/13/2014  . Essential hypertension   . History of breast cancer 11/09/2013  . Seizures ([place]) 10/04/2013  . H/O aortic valve replacement TAVR 09/07/2013  . Hypothyroid   . Hyperlipidemia   . PHN (postherpetic neuralgia)   . GERD (gastroesophageal reflux disease)   . OAB (overactive bladder)     Orientation RESPIRATION BLADDER Height & Weight     Self, Time, Situation, Place  O2 (3L) Continent Weight: 164 lb 3.9 oz (74.5 kg) Height:  5\' 7"  (170.2 cm)  BEHAVIORAL SYMPTOMS/MOOD NEUROLOGICAL BOWEL NUTRITION STATUS      Continent Diet (regular)   AMBULATORY STATUS COMMUNICATION OF NEEDS Skin   Limited Assist Verbally PU Stage and Appropriate Care PU Stage 1 Dressing:  (PRN dressing changes)                     Personal Care Assistance Level of Assistance  Bathing, Feeding, Dressing Bathing Assistance: Limited assistance Feeding assistance: Limited assistance Dressing Assistance: Limited assistance     Functional Limitations Info  Sight, Hearing, Speech Sight Info: Adequate Hearing Info: Adequate Speech Info: Adequate    SPECIAL CARE FACTORS FREQUENCY  PT (By licensed PT), OT (By licensed OT)     PT Frequency: 5x a week OT Frequency: 5x a week            Contractures Contractures Info: Not present    Additional Factors Info  Code Status, Allergies Code Status Info: full Allergies Info: Amoxicillin Penicillins           Current Medications (01/26/2020):  This is the current hospital active medication list Current Facility-Administered Medications  Medication Dose Route Frequency Provider Last Rate Last Admin  . 0.9 %  sodium chloride infusion  250 mL Intravenous PRN Shela Leff, MD      . acetaminophen (TYLENOL) tablet 650 mg  650 mg Oral Q4H PRN Shela Leff, MD   650 mg at 01/25/20 1028  . atorvastatin (LIPITOR) tablet 20 mg  20 mg Oral Daily Rai, Ripudeep K, MD   20 mg at 01/26/20 0951  . capsaicin (ZOSTRIX) 0.025 % cream   Topical TID PRN  Mendel Corning, MD   1 application at 01/00/71 0601  . enoxaparin (LOVENOX) injection 40 mg  40 mg Subcutaneous Q24H Shela Leff, MD   40 mg at 01/25/20 2212  . furosemide (LASIX) tablet 40 mg  40 mg Oral Daily Croitoru, Mihai, MD   40 mg at 01/26/20 0950  . guaiFENesin (MUCINEX) 12 hr tablet 600 mg  600 mg Oral BID Shela Leff, MD   600 mg at 01/26/20 0951  . hydrALAZINE (APRESOLINE) injection 5 mg  5 mg Intravenous Q4H PRN Shela Leff, MD   5 mg at 01/17/20 2128  . ipratropium-albuterol (DUONEB) 0.5-2.5 (3) MG/3ML nebulizer  solution 3 mL  3 mL Nebulization BID Gu[place] Shi, MD   3 mL at 01/26/20 0831  . levalbuterol (XOPENEX) nebulizer solution 0.63 mg  0.63 mg Nebulization Q6H PRN Rai, Ripudeep K, MD   0.63 mg at 01/19/20 1455  . levothyroxine (SYNTHROID) tablet 50 mcg  50 mcg Oral QAC breakfast Rai, Ripudeep K, MD   50 mcg at 01/26/20 0550  . lisinopril (ZESTRIL) tablet 5 mg  5 mg Oral Daily Croitoru, Mihai, MD   5 mg at 01/26/20 0950  . MEDLINE mouth rinse  15 mL Mouth Rinse BID Shela Leff, MD   15 mL at 01/26/20 0952  . metoprolol succinate (TOPROL-XL) 24 hr tablet 50 mg  50 mg Oral BID Jerline Pain, MD   50 mg at 01/26/20 0950  . mirabegron ER (MYRBETRIQ) tablet 50 mg  50 mg Oral Daily Rai, Ripudeep K, MD   50 mg at 01/26/20 0951  . pantoprazole (PROTONIX) EC tablet 40 mg  40 mg Oral Daily Rai, Ripudeep K, MD   40 mg at 01/26/20 0950  . potassium chloride SA (KLOR-CON) CR tablet 20 mEq  20 mEq Oral TID Croitoru, Mihai, MD   20 mEq at 01/26/20 0950  . sodium chloride (OCEAN) 0.65 % nasal spray 2 spray  2 spray Each Nare PRN Vance Gather B, MD      . sodium chloride flush (NS) 0.9 % injection 3 mL  3 mL Intravenous Q12H Shela Leff, MD   3 mL at 01/26/20 0951  . sodium chloride flush (NS) 0.9 % injection 3 mL  3 mL Intravenous PRN Shela Leff, MD   3 mL at 01/20/20 1326     Discharge Medications: Please see discharge summary for a list of discharge medications.  Relevant Imaging Results:  Relevant Lab Results:   Additional Information SSN [id number]  Ross Ludwig, LCSW

## 2020-01-26 NOTE — Progress Notes (Signed)
PROGRESS NOTE  Betty Chan  MLY:[id number] DOB: Jun 16, 1930 DOA: 01/17/2020 PCP: Maurice Small, MD   Brief Narrative: Betty Chan is an 86 year old female with history of AS s/p TAVR in 2015, breast CA s/p lumpectomy, HTN, HLD, hypothyroidism, GERD, and progressive supranuclear palsy who presented with exertional dyspnea  for 5 days prior to admission associated with coughing and ankle swelling.CXR showed pulmonary edema versus atypical infection. CTA chest negative for PE, showed patchy atelectasis, nonspecific peribronchial thickening and cardiomegaly, BNP elevated 504. She was admitted presumed CHF exacerbation confirmed to have LVEF 25-30%, also felt to have aspiration pneumonia and continues to be at high risk for aspiration. Cardiology was consulted, oversaw diuresis, which has been converted to oral regimen. Palliative care consulted, confirmed patient and family desire full aggressive measures but are ok with feeding with mitigation measures. Oxygen has been weaned slowly, currently at 3L O2. CXR demonstrating atelectasis without consolidation or significant pulmonary edema.   Assessment & Plan: Principal Problem:   Acute exacerbation of CHF (congestive heart failure) (HCC) Active Problems:   Hyperlipidemia   Essential hypertension   Elevated troponin   Acute respiratory failure with hypoxia (HCC)   Pressure injury of skin   Parkinsonism ([place])   Palliative care by specialist   Dyspnea   Goals of care, counseling/discussion   General weakness  Acute hypoxemic respiratory failure: Due to CHF and aspiration pneumonia. Remains despite Tx CHF. Suspect ongoing aspiration contributing to pneumonitis.  - Aspiration precautions - Wean oxygen as tolerated - Pulmonary toilet - Monitor fever curve, initiate abx if needed.  - CXR personally reviewed this AM showing no consolidation or significant pulmonary edema, most likely cause of hypoxia is atelectasis. Will attempt to wean oxygen  slowly, urge incentive spirometry. D/w RN. If unable to wean, consider CTA chest, though this has risks with renal insufficiency. - Maintain euvolemia.   Acute on chronic combined HFrEF: Patient has history of severe aortic stenosis status post TAVR, echo in 2016 ([facility]) showed EF 81%, grade 1 diastolic dysfunction.  2D echo showed EF of 25 to 30% with global hypokinesis, left ventricular severely decreased function.  Patient diuresed with IV Lasix 40 mgq12 hours, now has been transitioned to oral 40 mg daily. - Continue po lasix, low-dose ACEi, metoprolol increased.  - Follow up with cardiology as an outpatient. Signed off 8/1.  Aspiration pneumonia, pneumonitis: Reviewed prior records and discussed with daughter.  It appears that patient underwent fluoroscopic swallow evaluation in 2016 when she was felt to have mild abnormality with retention of liquids around vallecula.  Seen by speech therapy in this admission and placed on dysphagia 3 diet initially however continue to have coarse breath sounds and changed to dysphagia 2 diet.  Modified barium swallow in this admission also revealed mild aspiration risk and difficulty swallowing medications/requiring frequent cueing. - Complete course of antibiotics. Low level leukocytosis thought to be due to microaspirations. Will monitor. - SLP evaluations ongoing. - Continue nebulizer therapies  Stage IIIa CKD: Suspected diagnosis based on available records.  - Avoid nephrotoxins. Avoid contrast if possible.  Troponin elevation:  - No investigation currently planned per cardiology, no significant CAD on LHC 2015.  Progressive supranuclear palsy: With progressive debility. Wheelchair bound though now unable to assist with transfer. PT reevaluated 8/5 recommending lift use.  - SNF disposition planned. Full measures desired per palliative care  E. coli UTI: Completed abx  Hypothyroidism: TSH 1.6.  - Continue synthroid.  RN Pressure Injury  Documentation: Pressure Injury 01/17/20 Heel Right  Stage 1 -  Intact skin with non-blanchable redness of a localized area usually over a bony prominence. Right Heel stage 1 pressure injury (Active)  01/17/20 2325  Location: Heel  Location Orientation: Right  Staging: Stage 1 -  Intact skin with non-blanchable redness of a localized area usually over a bony prominence.  Wound Description (Comments): Right Heel stage 1 pressure injury  Present on Admission: Yes   DVT prophylaxis: Lovenox Code Status: Full Family Communication: Daughter by phone this afternoon.  Disposition Plan:  Status is: Inpatient  Remains inpatient appropriate because:Inpatient level of care appropriate due to severity of illness, remains newly hypoxemic.   Dispo: The patient is from: [place]              Anticipated d/c is to: SNF              Anticipated d/c date is: 1-2 days pending ability to wean oxygen.              Patient currently is not medically stable to d/c.  Consultants:   Palliative care  Cardiology  Procedures:   Echocardiogram  Barium Swallow Study  Antimicrobials:  Vancomycin 7/29 - 7/31  Ceftriaxone 7/28 - 8/1  Cefdinir 8/2 - 8/4  Flagyl 7/29 - 8/4  Subjective: Denies any complaints. Was unable to transfer with therapy today despite participating effectively. Does not feel short of breath, no chest pain, no bleeding, no leg swelling, denies orthopnea.  Objective: Vitals:   01/26/20 0500 01/26/20 0831 01/26/20 1015 01/26/20 1333  BP:    (!) 136/106  Pulse:    (!) 104  Resp:    20  Temp:    97.7 F (36.5 C)  TempSrc:    Oral  SpO2:  91% 93% 95%  Weight: 74.5 kg     Height:        Intake/Output Summary (Last 24 hours) at 01/26/2020 1656 Last data filed at 01/26/2020 1210 Gross per 24 hour  Intake 400 ml  Output 600 ml  Net -200 ml   Filed Weights   01/23/20 0500 01/24/20 0522 01/26/20 0500  Weight: 76.3 kg 75 kg 74.5 kg   Gen: Elderly, frail female  in no distress Pulm: Nonlabored breathing supplemental oxgen. Max assist to raise for lung exam, scant crackles at R > L base, no wheezes. CV: Regular rate and rhythm. No murmur, rub, or gallop. No JVD, trace dependent edema. GI: Abdomen soft, non-tender, non-distended, with normoactive bowel sounds.  Ext: Warm, no deformities Skin: No new rashes, lesions or ulcers on visualized skin. Neuro: Alert and oriented but diffusely weak. No focal neurological deficits. Psych: Judgement and insight appear fair. Mood euthymic & affect congruent. Behavior is appropriate.    Data Reviewed: I have personally reviewed following labs and imaging studies  CBC: Recent Labs  Lab 01/23/20 0502 01/24/20 0446 01/25/20 0459 01/26/20 0420  WBC 9.5 11.0* 10.8* 10.9*  NEUTROABS  --   --   --  8.4*  HGB 12.0 12.5 12.4 13.3  HCT 38.4 39.5 39.9 41.4  MCV 99.7 98.8 99.0 97.6  PLT 315 327 342 811   Basic Metabolic Panel: Recent Labs  Lab 01/22/20 0515 01/22/20 0515 01/22/20 1738 01/23/20 0502 01/24/20 0446 01/25/20 0459 01/26/20 0420  NA 142  --   --  141 140 138 135  K 3.9   < > 4.3 4.4 4.9 4.8 4.8  CL 103  --   --  106 105 105 101  CO2 26  --   --  26 26 23 25   GLUCOSE 146*  --   --  114* 136* 122* 144*  BUN 29*  --   --  33* 39* 36* 34*  CREATININE 0.88  --   --  1.04* 1.10* 1.11* 1.10*  CALCIUM 9.0  --   --  8.8* 9.1 9.0 9.0  MG  --   --  1.9  --   --   --   --    < > = values in this interval not displayed.   GFR: Estimated Creatinine Clearance: 38 mL/min (A) (by C-G formula based on SCr of 1.1 mg/dL (H)). Liver Function Tests: No results for input(s): AST, ALT, ALKPHOS, BILITOT, PROT, ALBUMIN in the last 168 hours. No results for input(s): LIPASE, AMYLASE in the last 168 hours. No results for input(s): AMMONIA in the last 168 hours. Coagulation Profile: No results for input(s): INR, PROTIME in the last 168 hours. Cardiac Enzymes: No results for input(s): CKTOTAL, CKMB, CKMBINDEX,  TROPONINI in the last 168 hours. BNP (last 3 results) No results for input(s): PROBNP in the last 8760 hours. HbA1C: No results for input(s): HGBA1C in the last 72 hours. CBG: No results for input(s): GLUCAP in the last 168 hours. Lipid Profile: No results for input(s): CHOL, HDL, LDLCALC, TRIG, CHOLHDL, LDLDIRECT in the last 72 hours. Thyroid Function Tests: No results for input(s): TSH, T4TOTAL, FREET4, T3FREE, THYROIDAB in the last 72 hours. Anemia Panel: No results for input(s): VITAMINB12, FOLATE, FERRITIN, TIBC, IRON, RETICCTPCT in the last 72 hours. Urine analysis:    Component Value Date/Time   COLORURINE YELLOW 01/17/2020 2130   APPEARANCEUR HAZY (A) 01/17/2020 2130   LABSPEC 1.010 01/17/2020 2130   PHURINE 5.5 01/17/2020 2130   GLUCOSEU NEGATIVE 01/17/2020 2130   HGBUR TRACE (A) 01/17/2020 2130   BILIRUBINUR NEGATIVE 01/17/2020 2130   KETONESUR NEGATIVE 01/17/2020 2130   PROTEINUR NEGATIVE 01/17/2020 2130   UROBILINOGEN 1.0 11/25/2014 0220   NITRITE POSITIVE (A) 01/17/2020 2130   LEUKOCYTESUR SMALL (A) 01/17/2020 2130   Recent Results (from the past 240 hour(s))  SARS Coronavirus 2 by RT PCR (hospital order, performed in [facility] hospital lab) Nasopharyngeal Nasopharyngeal Swab     Status: None   Collection Time: 01/17/20  3:12 PM   Specimen: Nasopharyngeal Swab  Result Value Ref Range Status   SARS Coronavirus 2 NEGATIVE NEGATIVE Final    Comment: (NOTE) SARS-CoV-2 target nucleic acids are NOT DETECTED.  The SARS-CoV-2 RNA is generally detectable in upper and lower respiratory specimens during the acute phase of infection. The lowest concentration of SARS-CoV-2 viral copies this assay can detect is 250 copies / mL. A negative result does not preclude SARS-CoV-2 infection and should not be used as the sole basis for treatment or other patient management decisions.  A negative result may occur with improper specimen collection / handling, submission of specimen  other than nasopharyngeal swab, presence of viral mutation(s) within the areas targeted by this assay, and inadequate number of viral copies (<250 copies / mL). A negative result must be combined with clinical observations, patient history, and epidemiological information.  Fact Sheet for Patients:   StrictlyIdeas.no  Fact Sheet for Healthcare Providers: BankingDealers.co.za  This test is not yet approved or  cleared by the [place] FDA and has been authorized for detection and/or diagnosis of SARS-CoV-2 by FDA under an Emergency Use Authorization (EUA).  This EUA will remain in effect (meaning this test can be  used) for the duration of the COVID-19 declaration under Section 564(b)(1) of the Act, 21 U.S.C. section 360bbb-3(b)(1), unless the authorization is terminated or revoked sooner.  Performed at [facility], [place] [address], [place], [place] [address]   Urine Culture     Status: Abnormal   Collection Time: 01/17/20  9:30 PM   Specimen: Urine, Random  Result Value Ref Range Status   Specimen Description   Final    URINE, RANDOM Performed at [facility] [address] [place], [place] [address]    Special Requests   Final    NONE Performed at [facility], [place] [address] [place], [place] [address]    Culture >=100,000 COLONIES/mL ESCHERICHIA COLI (A)  Final   Report Status 01/20/2020 FINAL  Final   Organism ID, Bacteria ESCHERICHIA COLI (A)  Final      Susceptibility   Escherichia coli - MIC*    AMPICILLIN 8 SENSITIVE Sensitive     CEFAZOLIN <=4 SENSITIVE Sensitive     CEFTRIAXONE <=0.25 SENSITIVE Sensitive     CIPROFLOXACIN <=0.25 SENSITIVE Sensitive     GENTAMICIN <=1 SENSITIVE Sensitive     IMIPENEM <=0.25 SENSITIVE Sensitive     NITROFURANTOIN 32 SENSITIVE Sensitive     TRIMETH/SULFA <=20 SENSITIVE Sensitive     AMPICILLIN/SULBACTAM 4 SENSITIVE  Sensitive     PIP/TAZO <=4 SENSITIVE Sensitive     * >=100,000 COLONIES/mL ESCHERICHIA COLI  MRSA PCR Screening     Status: None   Collection Time: 01/17/20 11:33 PM   Specimen: Nasal Mucosa; Nasopharyngeal  Result Value Ref Range Status   MRSA by PCR NEGATIVE NEGATIVE Final    Comment:        The GeneXpert MRSA Assay (FDA approved for NASAL specimens only), is one component of a comprehensive MRSA colonization surveillance program. It is not intended to diagnose MRSA infection nor to guide or monitor treatment for MRSA infections. Performed at [facility], [place] [address] [place], [place] [address]       Radiology Studies: DG CHEST PORT 1 VIEW  Result Date: 01/26/2020 CLINICAL DATA:  Aspiration pneumonitis EXAM: PORTABLE CHEST 1 VIEW COMPARISON:  January 18, 2020 FINDINGS: There is atelectatic change in the left base. No edema or airspace opacity. Heart is mildly enlarged with pulmonary vascularity normal. No adenopathy. There is an aortic valve replacement. There is aortic atherosclerosis. There are clips in the right axillary region. IMPRESSION: Left base atelectasis. Lungs elsewhere clear. Stable cardiomegaly. Postoperative changes noted. Aortic Atherosclerosis (ICD10-I70.0). Electronically Signed   By: Lowella Grip III M.D.   On: 01/26/2020 07:57    Scheduled Meds: . atorvastatin  20 mg Oral Daily  . enoxaparin (LOVENOX) injection  40 mg Subcutaneous Q24H  . furosemide  40 mg Oral Daily  . guaiFENesin  600 mg Oral BID  . ipratropium-albuterol  3 mL Nebulization BID  . levothyroxine  50 mcg Oral QAC breakfast  . lisinopril  5 mg Oral Daily  . mouth rinse  15 mL Mouth Rinse BID  . metoprolol succinate  50 mg Oral BID  . mirabegron ER  50 mg Oral Daily  . pantoprazole  40 mg Oral Daily  . potassium chloride  20 mEq Oral TID  . sodium chloride flush  3 mL Intravenous Q12H   Continuous Infusions: . sodium chloride       LOS: 9 days   Time spent:  25 minutes.  Patrecia Pour, MD [facility] [email or web address]  01/26/2020, 4:56 PM

## 2020-01-26 NOTE — Progress Notes (Signed)
Occupational Therapy Treatment Patient Details Name: Betty Chan MRN: [id number] DOB: 21-Feb-1930 Today's Date: 01/26/2020    History of present illness Patient is 86 year old female with history of severe aortic stenosis status post TAVR in 2015, breast CA status post lumpectomy, hypertension, hyperlipidemia, hypothyroidism, GERD presented with shortness of breath.Chest x-ray showed pulmonary edema versus atypical infection.  CTA chest negative for PE, showed patchy atelectasis, nonspecific peribronchial thickening and cardiomegaly   OT comments  Pt willing to get OOB but unable to stand with OT.  Recommend lift forpt  Follow Up Recommendations  SNF          Precautions / Restrictions Precautions Precaution Comments: incontinent of B/B ,check before mobilizing Restrictions Weight Bearing Restrictions: No       Mobility Bed Mobility Overal bed mobility: Needs Assistance Bed Mobility: Supine to Sit;Sit to Supine     Supine to sit: Mod assist Sit to supine: Max assist;+2 for physical assistance      Transfers Overall transfer level: Needs assistance   Transfers: Sit to/from Stand Sit to Stand: +2 physical assistance;+2 safety/equipment;Max assist         General transfer comment: unable to reach full stand.  Did clear bottom from bed briefly before sitting back down.  Reccomend lift for pt    Balance Overall balance assessment: Needs assistance   Sitting balance-Leahy Scale: Fair Sitting balance - Comments: Kyphotoc posture with posterior pelvic tilt. Requires BUE support on bed majority of sitting time. Postural control: Posterior lean                                 ADL either performed or assessed with clinical judgement   ADL Overall ADL's : Needs assistance/impaired Eating/Feeding: Bed level;Minimal assistance Eating/Feeding Details (indicate cue type and reason): HOB raised Grooming: Sitting;Maximal assistance Grooming Details (indicate  cue type and reason): pt leaning L and back sitting EOB.  Needed mod A for sitting balance                               General ADL Comments: Please refer to Mobility section.     Vision Patient Visual Report: No change from baseline            Cognition Arousal/Alertness: Awake/alert Behavior During Therapy: WFL for tasks assessed/performed Overall Cognitive Status: No family/caregiver present to determine baseline cognitive functioning                                                     Pertinent Vitals/ Pain       Pain Assessment: No/denies pain     Prior Functioning/Environment              Frequency  Min 2X/week        Progress Toward Goals  OT Goals(current goals can now be found in the care plan section)  Progress towards OT goals: Progressing toward goals     Plan Discharge plan remains appropriate       AM-PAC OT "6 Clicks" Daily Activity     Outcome Measure   Help from another person eating meals?: A Little Help from another person taking care of personal grooming?: A Lot Help from another person toileting, which includes using  toliet, bedpan, or urinal?: Total Help from another person bathing (including washing, rinsing, drying)?: A Lot Help from another person to put on and taking off regular upper body clothing?: A Lot Help from another person to put on and taking off regular lower body clothing?: Total 6 Click Score: 11    End of Session    OT Visit Diagnosis: Muscle weakness (generalized) (M62.81)   Activity Tolerance Patient tolerated treatment well   Patient Left in bed;with call bell/phone within reach;with bed alarm set   Nurse Communication Mobility status        Time: 4784-1282 OT Time Calculation (min): 21 min  Charges: OT General Charges $OT Visit: 1 Visit OT Treatments $Self Care/Home Management : 8-22 mins  Kari Baars, [place] [phone] Office- [phone]      Noel Henandez, [place] D 01/26/2020, 11:28 AM

## 2020-01-26 NOTE — TOC Progression Note (Signed)
Transition of Care [facility]) - Progression Note    Patient Details  Name: Betty Chan MRN: [id number] Date of Birth: [birth year]  [facility]) CM/SW Contact  Ross Ludwig, [place] Phone Number: 01/26/2020, 3:06 PM  Clinical Narrative:     CSW spoke to patient's daughter, and discussed SNF bed offers.  CSW was informed by patient's daughter that they have 24/7 care givers at the home and patient does not normally have oxygen at home.  CSW explained the benefits of SNF verse home health, patient's daughter stated that she would rather have patient come home with home health, but if she does need SNF she would prefer [facility].  2:30pm  CSW spoke to [facility], and they are denying patient for SNF placement due to patient not being medically ready for discharge yet per clinicals.  Insurance company stated that [place] can restart insurance authorization when patient is medically ready for discharge.    Expected Discharge Plan: Assisted Living Barriers to Discharge: Continued Medical Work up  Expected Discharge Plan and Services Expected Discharge Plan: Assisted Living   Discharge Planning Services: CM Consult   Living arrangements for the past 2 months: Assisted Living Facility                                       Social Determinants of Health (SDOH) Interventions    Readmission Risk Interventions No flowsheet data found.

## 2020-01-26 NOTE — Progress Notes (Signed)
   01/25/20 2059  Assess: MEWS Score  Temp 97.7 F (36.5 C)  BP 111/68  Pulse Rate 81  Resp (!) 26  Level of Consciousness Alert  SpO2 100 %  O2 Device Nasal Cannula  O2 Flow Rate (L/min) 4 L/min  Assess: MEWS Score  MEWS Temp 0  MEWS Systolic 0  MEWS Pulse 0  MEWS RR 2  MEWS LOC 0  MEWS Score 2  MEWS Score Color Yellow  Assess: if the MEWS score is Yellow or Red  Were vital signs taken at a resting state? No (after pt received bed bath)  Focused Assessment No change from prior assessment  Early Detection of Sepsis Score *See Row Information* Low  MEWS guidelines implemented *See Row Information* No, vital signs rechecked (15 mins after resting)

## 2020-01-26 NOTE — TOC Progression Note (Signed)
Transition of Care [facility]) - Progression Note    Patient Details  Name: Betty Chan MRN: [id number] Date of Birth: Jan 26, 1930  Transition [facility] Care [facility]) CM/SW Contact  Ross Ludwig, [place] Phone Number: 01/26/2020, 5:52 PM  Clinical Narrative:     CSW spoke with patient's daughter and informed her that insurance initially denied patient on 01/25/20 because per notes, patient is not medically ready for discharge.  CSW was informed by insurance company to resubmit once patient is more medically ready for discharge.  CSW to resubmit clinicals at a later time.  Expected Discharge Plan: Assisted Living Barriers to Discharge: Continued Medical Work up  Expected Discharge Plan and Services Expected Discharge Plan: Assisted Living   Discharge Planning Services: CM Consult   Living arrangements for the past 2 months: Assisted Living Facility                                       Social Determinants of Health (SDOH) Interventions    Readmission Risk Interventions No flowsheet data found.

## 2020-01-27 NOTE — TOC Progression Note (Addendum)
Transition of Care [facility]) - Progression Note    Patient Details  Name: Alfonso Shackett MRN: [id number] Date of Birth: 04-23-1930  Transition of Care [facility]) CM/SW Contact  Ross Ludwig, [place] Phone Number: 01/27/2020, 4:06 PM  Clinical Narrative:     CSW attempted to contact patient's daughter to provide bed choice for SNF.  Left a message on voice mail patient's daughter.   Per patient's daughter yesterday, she would prefer [facility], CSW talked to [place] at [place], they do not have any beds available at this time.  She said to check back on Monday.  CSW started insurance authorization for patient, reference number [id number].  CSW continuing to follow patient's progress throughout discharge planning.   Expected Discharge Plan: Assisted Living Barriers to Discharge: Continued Medical Work up  Expected Discharge Plan and Services Expected Discharge Plan: Assisted Living   Discharge Planning Services: CM Consult   Living arrangements for the past 2 months: Assisted Living Facility                                       Social Determinants of Health (SDOH) Interventions    Readmission Risk Interventions No flowsheet data found.

## 2020-01-27 NOTE — Progress Notes (Signed)
Physical Therapy Treatment Patient Details Name: Betty Chan MRN: [id number] DOB: 10-20-1929 Today's Date: 01/27/2020    History of Present Illness Patient is 86 year old female with history of severe aortic stenosis status post TAVR in 2015, breast CA status post lumpectomy, hypertension, hyperlipidemia, hypothyroidism, GERD presented with shortness of breath.Chest x-ray showed pulmonary edema versus atypical infection.  CTA chest negative for PE, showed patchy atelectasis, nonspecific peribronchial thickening and cardiomegaly    PT Comments    The patient is awake, able to engage in treatment, answers with mostly nods. Noted frequent deep cough, nonproductive. Patient assisted  With rolling and sitting up on bedside, requires extensive assistance of 2 persons. Attempted standing x 2, posture with significant forward flexion. Does not bear weight long enough to pivot to recliner. Recommend mechanical lift. Patient placed in bed chair position in upright posture.  SPO2 92-94%. HR 100/ continue PT for progressive mobility.  Follow Up Recommendations  SNF     Equipment Recommendations  None recommended by PT    Recommendations for Other Services       Precautions / Restrictions Precautions Precautions: Fall Precaution Comments: incontinent of B/B ,check before mobilizing, monitor sats    Mobility  Bed Mobility   Bed Mobility: Supine to Sit;Sit to Supine Rolling: Mod assist;+2 for physical assistance;+2 for safety/equipment   Supine to sit: +2 for safety/equipment;+2 for physical assistance;Total assist Sit to supine: +2 for physical assistance;+2 for safety/equipment;Total assist   General bed mobility comments: assist to roll, does reach for rail and holds to asssit in sidelying. bed linens changed as Purewick malfunctioned and had BM. Total assist of 2 to roll to each side and to fully situpright, assisting with llegs and trunk, then total to return to supine.  Transfers    Equipment used: Rolling walker (2 wheeled) Transfers: Sit to/from Stand Sit to Stand: +2 physical assistance;+2 safety/equipment;Max assist         General transfer comment: unable to reach full stand.  Did clear bottom from bed briefly before sitting back down.  Attempted x 2. does not stand/bear weight long enough for pivot transfer.Reccomend lift for pt  Ambulation/Gait             General Gait Details: non ambulatory PTA   Stairs             Wheelchair Mobility    Modified Rankin (Stroke Patients Only)       Balance Overall balance assessment: Needs assistance Sitting-balance support: Bilateral upper extremity supported;Feet supported Sitting balance-Leahy Scale: Fair Sitting balance - Comments: Kyphotoc posture with posterior pelvic tilt. Requires BUE support on bed majority of sitting time.                                    Cognition   Behavior During Therapy: WFL for tasks assessed/performed Overall Cognitive Status: Difficult to assess                                 General Comments: alert, follows directions with delay, very weak efforts      Exercises      General Comments        Pertinent Vitals/Pain Faces Pain Scale: Hurts a little bit Pain Location: right knee ,is contracted in flexion    Home Living  Prior Function            PT Goals (current goals can now be found in the care plan section) Progress towards PT goals: Progressing toward goals    Frequency    Min 2X/week      PT Plan Current plan remains appropriate    Co-evaluation              AM-PAC PT "6 Clicks" Mobility   Outcome Measure  Help needed turning from your back to your side while in a flat bed without using bedrails?: Total Help needed moving from lying on your back to sitting on the side of a flat bed without using bedrails?: Total Help needed moving to and from a bed to a chair  (including a wheelchair)?: Total Help needed standing up from a chair using your arms (e.g., wheelchair or bedside chair)?: Total Help needed to walk in hospital room?: Total Help needed climbing 3-5 steps with a railing? : Total 6 Click Score: 6    End of Session Equipment Utilized During Treatment: Oxygen Activity Tolerance: Patient limited by fatigue Patient left: in bed;with call bell/phone within reach;with bed alarm set (in bed chair position) Nurse Communication: Mobility status PT Visit Diagnosis: Unsteadiness on feet (R26.81)     Time: 1115-5208 PT Time Calculation (min) (ACUTE ONLY): 30 min  Charges:  $Therapeutic Activity: 23-37 mins                     [place] Pager [phone] Office [phone]    Claretha Cooper 01/27/2020, 9:58 AM

## 2020-01-27 NOTE — Plan of Care (Signed)

## 2020-01-27 NOTE — Progress Notes (Signed)
PROGRESS NOTE  Myrissa Chipley  WUX:[id number] DOB: 08-04-1929 DOA: 01/17/2020 PCP: Maurice Small, MD   Brief Narrative: Staphany Ditton is an 86 year old female with history of AS s/p TAVR in 2015, breast CA s/p lumpectomy, HTN, HLD, hypothyroidism, GERD, and progressive supranuclear palsy who presented with exertional dyspnea  for 5 days prior to admission associated with coughing and ankle swelling.CXR showed pulmonary edema versus atypical infection. CTA chest negative for PE, showed patchy atelectasis, nonspecific peribronchial thickening and cardiomegaly, BNP elevated 504. She was admitted presumed CHF exacerbation confirmed to have LVEF 25-30%, also felt to have aspiration pneumonia and continues to be at high risk for aspiration. Cardiology was consulted, oversaw diuresis, which has been converted to oral regimen. Palliative care consulted, confirmed patient and family desire full aggressive measures but are ok with feeding with mitigation measures. Oxygen has been weaned slowly, currently at 2L O2. CXR demonstrating atelectasis without consolidation or significant pulmonary edema.   Assessment & Plan: Principal Problem:   Acute exacerbation of CHF (congestive heart failure) (HCC) Active Problems:   Hyperlipidemia   Essential hypertension   Elevated troponin   Acute respiratory failure with hypoxia (HCC)   Pressure injury of skin   Parkinsonism ([place])   Palliative care by specialist   Dyspnea   Goals of care, counseling/discussion   General weakness  Acute hypoxemic respiratory failure: Due to CHF and aspiration pneumonia. Remains despite Tx CHF. Suspect ongoing aspiration contributing to pneumonitis and atelectasis related to increased debility.  - Aspiration precautions - Pulmonary toilet, encouraged and had pt practice IS today. - Monitor fever curve, initiate abx if needed.  - CXR suggests most likely cause of hypoxia is atelectasis. Successfully weaning oxygen to 3L 8/5, will  continue down to 2L today. Pt's medical status is optimized and she is stable for discharge to SNF. Insurance authorization is pending.   - Maintain euvolemia.   Acute on chronic combined HFrEF: Patient has history of severe aortic stenosis status post TAVR, echo in 2016 ([facility]) showed EF 25%, grade 1 diastolic dysfunction.  2D echo showed EF of 25 to 30% with global hypokinesis, left ventricular severely decreased function.  Patient diuresed with IV Lasix 40 mgq12 hours, now has been transitioned to oral 40 mg daily. - Continue po lasix, low-dose ACEi, metoprolol increased.  - Follow up with cardiology as an outpatient. Signed off 8/1.  Aspiration pneumonia, pneumonitis: Reviewed prior records and discussed with daughter.  It appears that patient underwent fluoroscopic swallow evaluation in 2016 when she was felt to have mild abnormality with retention of liquids around vallecula.  Seen by speech therapy in this admission and placed on dysphagia 3 diet initially however continue to have coarse breath sounds and changed to dysphagia 2 diet.  Modified barium swallow in this admission also revealed mild aspiration risk and difficulty swallowing medications/requiring frequent cueing. - Completed course of antibiotics. Low level leukocytosis possibly due to microaspirations. Will monitor. - SLP evaluations ongoing. - Continue nebulizer therapies  Stage IIIa CKD: Suspected diagnosis based on available records.  - Avoid nephrotoxins. Avoid contrast if possible. - Recheck 8/7.  Troponin elevation:  - No investigation currently planned per cardiology, no significant CAD on LHC 2015.  Progressive supranuclear palsy: With progressive debility. Wheelchair bound though now unable to assist with transfer. PT reevaluated 8/5 recommending lift use.  - SNF disposition planned. Full measures desired per palliative care discussions  E. coli UTI: Completed abx  Hypothyroidism: TSH 1.6.  - Continue  synthroid.  RN Pressure Injury  Documentation: Pressure Injury 01/17/20 Heel Right Stage 1 -  Intact skin with non-blanchable redness of a localized area usually over a bony prominence. Right Heel stage 1 pressure injury (Active)  01/17/20 2325  Location: Heel  Location Orientation: Right  Staging: Stage 1 -  Intact skin with non-blanchable redness of a localized area usually over a bony prominence.  Wound Description (Comments): Right Heel stage 1 pressure injury  Present on Admission: Yes   DVT prophylaxis: Lovenox Code Status: Full Family Communication: Daughter by phone this afternoon.  Disposition Plan:  Status is: Inpatient  Remains inpatient appropriate because:Inpatient level of care appropriate due to severity of illness, remains newly hypoxemic.   Dispo: The patient is from: [place]              Anticipated d/c is to: SNF              Anticipated d/c date is: 01/28/2020, insurance authorization has been requested and is pending.               Patient currently is medically stable to d/c.  Consultants:   Palliative care  Cardiology  Procedures:   Echocardiogram  Barium Swallow Study  Antimicrobials:  Vancomycin 7/29 - 7/31  Ceftriaxone 7/28 - 8/1  Cefdinir 8/2 - 8/4  Flagyl 7/29 - 8/4  Subjective: Working with PT this AM, EOB, not able to stand. Denies any new pain or N/V/D. No chest pain, no dyspnea at rest. Denies wheezing.   Objective: Vitals:   01/27/20 0548 01/27/20 0746 01/27/20 0800 01/27/20 1253  BP:    107/61  Pulse: 70   80  Resp:   16 (!) 22  Temp:    99 F (37.2 C)  TempSrc:    Oral  SpO2: 96% 94%  96%  Weight:      Height:        Intake/Output Summary (Last 24 hours) at 01/27/2020 1347 Last data filed at 01/27/2020 1145 [facility] per 24 hour  Intake 580 ml  Output 800 ml  Net -220 ml   Filed Weights   01/23/20 0500 01/24/20 0522 01/26/20 0500  Weight: 76.3 kg 75 kg 74.5 kg   Gen: 86 y.o. female in no distress Pulm:  Nonlabored breathing 2L O2, SpO2 in 90%'s while working with PT. Clear, diminished at bases. CV: Regular rate and rhythm. No murmur, rub, or gallop. No JVD, no dependent edema. GI: Abdomen soft, non-tender, non-distended, with normoactive bowel sounds.  Ext: Warm, no deformities Skin: No rashes, lesions or ulcers on visualized skin. Neuro: Alert and oriented. Poor postural tone without focal neurological deficits. Psych: Judgement and insight appear intact. Mood euthymic & affect congruent. Behavior is appropriate.    Data Reviewed: I have personally reviewed following labs and imaging studies  CBC: Recent Labs  Lab 01/23/20 0502 01/24/20 0446 01/25/20 0459 01/26/20 0420  WBC 9.5 11.0* 10.8* 10.9*  NEUTROABS  --   --   --  8.4*  HGB 12.0 12.5 12.4 13.3  HCT 38.4 39.5 39.9 41.4  MCV 99.7 98.8 99.0 97.6  PLT 315 327 342 626   Basic Metabolic Panel: Recent Labs  Lab 01/22/20 0515 01/22/20 0515 01/22/20 1738 01/23/20 0502 01/24/20 0446 01/25/20 0459 01/26/20 0420  NA 142  --   --  141 140 138 135  K 3.9   < > 4.3 4.4 4.9 4.8 4.8  CL 103  --   --  106 105 105 101  CO2 26  --   --  26 26 23 25   GLUCOSE 146*  --   --  114* 136* 122* 144*  BUN 29*  --   --  33* 39* 36* 34*  CREATININE 0.88  --   --  1.04* 1.10* 1.11* 1.10*  CALCIUM 9.0  --   --  8.8* 9.1 9.0 9.0  MG  --   --  1.9  --   --   --   --    < > = values in this interval not displayed.   GFR: Estimated Creatinine Clearance: 38 mL/min (A) (by C-G formula based on SCr of 1.1 mg/dL (H)). Liver Function Tests: No results for input(s): AST, ALT, ALKPHOS, BILITOT, PROT, ALBUMIN in the last 168 hours. No results for input(s): LIPASE, AMYLASE in the last 168 hours. No results for input(s): AMMONIA in the last 168 hours. Coagulation Profile: No results for input(s): INR, PROTIME in the last 168 hours. Cardiac Enzymes: No results for input(s): CKTOTAL, CKMB, CKMBINDEX, TROPONINI in the last 168 hours. BNP (last 3  results) No results for input(s): PROBNP in the last 8760 hours. HbA1C: No results for input(s): HGBA1C in the last 72 hours. CBG: No results for input(s): GLUCAP in the last 168 hours. Lipid Profile: No results for input(s): CHOL, HDL, LDLCALC, TRIG, CHOLHDL, LDLDIRECT in the last 72 hours. Thyroid Function Tests: No results for input(s): TSH, T4TOTAL, FREET4, T3FREE, THYROIDAB in the last 72 hours. Anemia Panel: No results for input(s): VITAMINB12, FOLATE, FERRITIN, TIBC, IRON, RETICCTPCT in the last 72 hours. Urine analysis:    Component Value Date/Time   COLORURINE YELLOW 01/17/2020 2130   APPEARANCEUR HAZY (A) 01/17/2020 2130   LABSPEC 1.010 01/17/2020 2130   PHURINE 5.5 01/17/2020 2130   GLUCOSEU NEGATIVE 01/17/2020 2130   HGBUR TRACE (A) 01/17/2020 2130   BILIRUBINUR NEGATIVE 01/17/2020 2130   KETONESUR NEGATIVE 01/17/2020 2130   PROTEINUR NEGATIVE 01/17/2020 2130   UROBILINOGEN 1.0 11/25/2014 0220   NITRITE POSITIVE (A) 01/17/2020 2130   LEUKOCYTESUR SMALL (A) 01/17/2020 2130   Recent Results (from the past 240 hour(s))  SARS Coronavirus 2 by RT PCR (hospital order, performed in [facility] hospital lab) Nasopharyngeal Nasopharyngeal Swab     Status: None   Collection Time: 01/17/20  3:12 PM   Specimen: Nasopharyngeal Swab  Result Value Ref Range Status   SARS Coronavirus 2 NEGATIVE NEGATIVE Final    Comment: (NOTE) SARS-CoV-2 target nucleic acids are NOT DETECTED.  The SARS-CoV-2 RNA is generally detectable in upper and lower respiratory specimens during the acute phase of infection. The lowest concentration of SARS-CoV-2 viral copies this assay can detect is 250 copies / mL. A negative result does not preclude SARS-CoV-2 infection and should not be used as the sole basis for treatment or other patient management decisions.  A negative result may occur with improper specimen collection / handling, submission of specimen other than nasopharyngeal swab, presence of  viral mutation(s) within the areas targeted by this assay, and inadequate number of viral copies (<250 copies / mL). A negative result must be combined with clinical observations, patient history, and epidemiological information.  Fact Sheet for Patients:   StrictlyIdeas.no  Fact Sheet for Healthcare Providers: BankingDealers.co.za  This test is not yet approved or  cleared by the [place] FDA and has been authorized for detection and/or diagnosis of SARS-CoV-2 by FDA under an Emergency Use Authorization (EUA).  This EUA will remain in effect (meaning this test can be used) for the duration of the COVID-19 declaration  under Section 564(b)(1) of the Act, 21 U.S.C. section 360bbb-3(b)(1), unless the authorization is terminated or revoked sooner.  Performed at [facility], [place] [address], [place], [place] [address]   Urine Culture     Status: Abnormal   Collection Time: 01/17/20  9:30 PM   Specimen: Urine, Random  Result Value Ref Range Status   Specimen Description   Final    URINE, RANDOM Performed at [facility] [address], [place], [place] [address]    Special Requests   Final    NONE Performed at [facility], [place] [address], [place], [place] [address]    Culture >=100,000 COLONIES/mL ESCHERICHIA COLI (A)  Final   Report Status 01/20/2020 FINAL  Final   Organism ID, Bacteria ESCHERICHIA COLI (A)  Final      Susceptibility   Escherichia coli - MIC*    AMPICILLIN 8 SENSITIVE Sensitive     CEFAZOLIN <=4 SENSITIVE Sensitive     CEFTRIAXONE <=0.25 SENSITIVE Sensitive     CIPROFLOXACIN <=0.25 SENSITIVE Sensitive     GENTAMICIN <=1 SENSITIVE Sensitive     IMIPENEM <=0.25 SENSITIVE Sensitive     NITROFURANTOIN 32 SENSITIVE Sensitive     TRIMETH/SULFA <=20 SENSITIVE Sensitive     AMPICILLIN/SULBACTAM 4 SENSITIVE Sensitive     PIP/TAZO <=4 SENSITIVE Sensitive      * >=100,000 COLONIES/mL ESCHERICHIA COLI  MRSA PCR Screening     Status: None   Collection Time: 01/17/20 11:33 PM   Specimen: Nasal Mucosa; Nasopharyngeal  Result Value Ref Range Status   MRSA by PCR NEGATIVE NEGATIVE Final    Comment:        The GeneXpert MRSA Assay (FDA approved for NASAL specimens only), is one component of a comprehensive MRSA colonization surveillance program. It is not intended to diagnose MRSA infection nor to guide or monitor treatment for MRSA infections. Performed at [facility], [place] [address], [place], [place] [address]       Radiology Studies: DG CHEST PORT 1 VIEW  Result Date: 01/26/2020 CLINICAL DATA:  Aspiration pneumonitis EXAM: PORTABLE CHEST 1 VIEW COMPARISON:  January 18, 2020 FINDINGS: There is atelectatic change in the left base. No edema or airspace opacity. Heart is mildly enlarged with pulmonary vascularity normal. No adenopathy. There is an aortic valve replacement. There is aortic atherosclerosis. There are clips in the right axillary region. IMPRESSION: Left base atelectasis. Lungs elsewhere clear. Stable cardiomegaly. Postoperative changes noted. Aortic Atherosclerosis (ICD10-I70.0). Electronically Signed   By: Lowella Grip III M.D.   On: 01/26/2020 07:57    Scheduled Meds: . atorvastatin  20 mg Oral Daily  . enoxaparin (LOVENOX) injection  40 mg Subcutaneous Q24H  . furosemide  40 mg Oral Daily  . guaiFENesin  600 mg Oral BID  . ipratropium-albuterol  3 mL Nebulization BID  . levothyroxine  50 mcg Oral QAC breakfast  . lisinopril  5 mg Oral Daily  . mouth rinse  15 mL Mouth Rinse BID  . metoprolol succinate  50 mg Oral BID  . mirabegron ER  50 mg Oral Daily  . pantoprazole  40 mg Oral Daily  . potassium chloride  20 mEq Oral TID  . sodium chloride flush  3 mL Intravenous Q12H   Continuous Infusions: . sodium chloride       LOS: 10 days   Time spent: 25 minutes.  Patrecia Pour, MD [facility] [email or web address] 01/27/2020, 1:47 PM

## 2020-01-28 LAB — CBC WITH DIFFERENTIAL/PLATELET
Abs Immature Granulocytes: 0.11 10*3/uL — ABNORMAL HIGH (ref 0.00–0.07)
Basophils Absolute: 0 10*3/uL (ref 0.0–0.1)
Basophils Relative: 0 %
Eosinophils Absolute: 0.2 10*3/uL (ref 0.0–0.5)
Eosinophils Relative: 2 %
HCT: 44.8 % (ref 36.0–46.0)
Hemoglobin: 13.9 g/dL (ref 12.0–15.0)
Immature Granulocytes: 1 %
Lymphocytes Relative: 13 %
Lymphs Abs: 1.2 10*3/uL (ref 0.7–4.0)
MCH: 30.9 pg (ref 26.0–34.0)
MCHC: 31 g/dL (ref 30.0–36.0)
MCV: 99.6 fL (ref 80.0–100.0)
Monocytes Absolute: 0.7 10*3/uL (ref 0.1–1.0)
Monocytes Relative: 8 %
Neutro Abs: 7.3 10*3/uL (ref 1.7–7.7)
Neutrophils Relative %: 76 %
Platelets: 328 10*3/uL (ref 150–400)
RBC: 4.5 MIL/uL (ref 3.87–5.11)
RDW: 13.3 % (ref 11.5–15.5)
WBC: 9.6 10*3/uL (ref 4.0–10.5)
nRBC: 0 % (ref 0.0–0.2)

## 2020-01-28 LAB — BASIC METABOLIC PANEL
Anion gap: 12 (ref 5–15)
Anion gap: 10 (ref 5–15)
BUN: 46 mg/dL — ABNORMAL HIGH (ref 8–23)
BUN: 46 mg/dL — ABNORMAL HIGH (ref 8–23)
CO2: 25 mmol/L (ref 22–32)
CO2: 27 mmol/L (ref 22–32)
Calcium: 9 mg/dL (ref 8.9–10.3)
Calcium: 9.2 mg/dL (ref 8.9–10.3)
Chloride: 100 mmol/L (ref 98–111)
Chloride: 102 mmol/L (ref 98–111)
Creatinine, Ser: 1.2 mg/dL — ABNORMAL HIGH (ref 0.44–1.00)
Creatinine, Ser: 1.24 mg/dL — ABNORMAL HIGH (ref 0.44–1.00)
GFR calc Af Amer: 47 mL/min — ABNORMAL LOW (ref >60)
GFR calc Af Amer: 45 mL/min — ABNORMAL LOW (ref >60)
GFR calc non Af Amer: 41 mL/min — ABNORMAL LOW (ref >60)
GFR calc non Af Amer: 39 mL/min — ABNORMAL LOW (ref >60)
Glucose, Bld: 121 mg/dL — ABNORMAL HIGH (ref 70–99)
Glucose, Bld: 132 mg/dL — ABNORMAL HIGH (ref 70–99)
Potassium: 5.6 mmol/L — ABNORMAL HIGH (ref 3.5–5.1)
Potassium: 5.2 mmol/L — ABNORMAL HIGH (ref 3.5–5.1)
Sodium: 137 mmol/L (ref 135–145)
Sodium: 139 mmol/L (ref 135–145)

## 2020-01-28 NOTE — TOC Progression Note (Addendum)
Transition of Care [facility]) - Progression Note    Patient Details  Name: Betty Chan MRN: [id number] Date of Birth: [birth year]  Transition of Care [facility] CM/SW [place], [place] Phone Number: 01/28/2020, 2:14 PM  Clinical Narrative:   Darrol Angel, who authorized patient stay at [facility] after multiple Faxes and calls.  Spoke with daughter about d/c plan.  She still has her heart set on [facility].  Loree Fee is not on call and I left message for DON, who has not called back.  Daughter wanted me to mention [facility] name, as that is a long term resident there who is also a relative of this family.  In the meantime, since I got no call back from [facility], I left a message for daughter to call me back with the intent of getting a commitment for another facility. TOC will continue to follow during the course of hospitalization.  Addendum:  Family chooses [facility], with the caveat that if she is still here on Monday, they would like one more effort at [facility].  Insurance notified.     Expected Discharge Plan: Assisted Living Barriers to Discharge: Continued Medical Work up  Expected Discharge Plan and Services Expected Discharge Plan: Assisted Living   Discharge Planning Services: CM Consult   Living arrangements for the past 2 months: Assisted Living Facility                                       Social Determinants of Health (SDOH) Interventions    Readmission Risk Interventions No flowsheet data found.

## 2020-01-28 NOTE — Progress Notes (Addendum)
PROGRESS NOTE  Betty Chan  GBT:[id number] DOB: May 11, 1930 DOA: 01/17/2020 PCP: Maurice Small, MD   Brief Narrative: Betty Chan is an 86 year old female with history of AS s/p TAVR in 2015, breast CA s/p lumpectomy, HTN, HLD, hypothyroidism, GERD, and progressive supranuclear palsy who presented with exertional dyspnea  for 5 days prior to admission associated with coughing and ankle swelling.CXR showed pulmonary edema versus atypical infection. CTA chest negative for PE, showed patchy atelectasis, nonspecific peribronchial thickening and cardiomegaly, BNP elevated 504. She was admitted presumed CHF exacerbation confirmed to have LVEF 25-30%, also felt to have aspiration pneumonia and continues to be at high risk for aspiration. Cardiology was consulted, oversaw diuresis, which has been converted to oral regimen. Palliative care consulted, confirmed patient and family desire full aggressive measures but are ok with feeding with mitigation measures. Oxygen has been weaned slowly, currently at 1L O2. CXR demonstrated atelectasis without consolidation or significant pulmonary edema. Due to decreased mobility from PLOF, SNF rehabilitation is being pursued. She is stable for discharge pending bed availability.  Assessment & Plan: Principal Problem:   Acute exacerbation of CHF (congestive heart failure) (HCC) Active Problems:   Hyperlipidemia   Essential hypertension   Elevated troponin   Acute respiratory failure with hypoxia (HCC)   Pressure injury of skin   Parkinsonism ([place])   Palliative care by specialist   Dyspnea   Goals of care, counseling/discussion   General weakness  Acute hypoxemic respiratory failure: Due to CHF and aspiration pneumonia. Remains despite Tx CHF. Suspect ongoing aspiration contributing to pneumonitis and atelectasis related to increased debility.  - Aspiration precautions - Continue frequent incentive spirometry. - CXR suggests most likely cause of hypoxia is  atelectasis. Successfully weaning oxygen, now down to 1L on 8/7. Pt's medical status is optimized and she is stable for discharge to SNF. Insurance authorization is pending.   - Maintain euvolemia.   Acute on chronic combined HFrEF: Patient has history of severe aortic stenosis status post TAVR, echo in 2016 ([facility]) showed EF 71%, grade 1 diastolic dysfunction.  2D echo showed EF of 25 to 30% with global hypokinesis, left ventricular severely decreased function.  Patient diuresed with IV Lasix 40 mgq12 hours, now has been transitioned to oral 40 mg daily. - Continue po lasix, low-dose ACEi, metoprolol increased.  - Follow up with cardiology as an outpatient. Signed off 8/1.  Aspiration pneumonia, pneumonitis: Reviewed prior records and discussed with daughter.  It appears that patient underwent fluoroscopic swallow evaluation in 2016 when she was felt to have mild abnormality with retention of liquids around vallecula.  Seen by speech therapy in this admission and placed on dysphagia 3 diet initially however continue to have coarse breath sounds and changed to dysphagia 2 diet.  Modified barium swallow in this admission also revealed mild aspiration risk and difficulty swallowing medications/requiring frequent cueing. - Completed course of antibiotics. Leukocytosis has resolved. Remains afebrile. - SLP evaluations ongoing. - Continue nebulizer therapies  Hyperkalemia: In setting of TID supplementation.  - Stop supplementation. Will monitor BMP this PM and continue telemetry.  Stage IIIb CKD: Suspected diagnosis based on available records.  - Avoid nephrotoxins. Avoid contrast if possible.  Troponin elevation:  - No investigation currently planned per cardiology, no significant CAD on LHC 2015.  Progressive supranuclear palsy: With progressive debility. Wheelchair bound though now unable to assist with transfer which is worse than PTA baseline. PT reevaluated 8/5 recommending lift use.  - SNF  disposition planned. Full measures desired per palliative care  discussions  E. coli UTI: Completed abx  Hypothyroidism: TSH 1.6.  - Continue synthroid.  Goals of care: Pt's functional status has declined during this acute illness/hospitalization. Rehabilitation remains the goal of care at this time, though the family is aware with this debility and now beginning to eat less and be less engaged mentally, there is a possibility that this elderly patient is transitioning toward end of life. To that end, we've discussed palliative care following along both here and at SNF. I've even recommended enrolling in hospice care if efforts at rehabilitation do not result in appreciable gains and/or if alertness and oral intake do not return to normal.  - Asked palliative care, Dr. Rowe Pavy, to reach out again to patient and family to continue these discussions.  - Family aware of, and largely on board with, recommendation for DNAR status. Will continue discussing with patient.  RN Pressure Injury Documentation: Pressure Injury 01/17/20 Heel Right Stage 1 -  Intact skin with non-blanchable redness of a localized area usually over a bony prominence. Right Heel stage 1 pressure injury (Active)  01/17/20 2325  Location: Heel  Location Orientation: Right  Staging: Stage 1 -  Intact skin with non-blanchable redness of a localized area usually over a bony prominence.  Wound Description (Comments): Right Heel stage 1 pressure injury  Present on Admission: Yes   DVT prophylaxis: Lovenox Code Status: Full Family Communication: Daughter and Son at bedside today. Disposition Plan:  Status is: Inpatient  Remains inpatient appropriate because:Inpatient level of care appropriate due to severity of illness, remains newly hypoxemic.   Dispo: The patient is from: [place]              Anticipated d/c is to: SNF              Anticipated d/c date is: 01/28/2020, insurance authorization has been requested and  is pending.               Patient currently is medically stable to d/c.  Consultants:   Palliative care  Cardiology  Procedures:   Echocardiogram  Barium Swallow Study  Antimicrobials:  Vancomycin 7/29 - 7/31  Ceftriaxone 7/28 - 8/1  Cefdinir 8/2 - 8/4  Flagyl 7/29 - 8/4  Subjective: Having intermittent withdrawn delirium. Has no complaints, says she "feels good." Denies trouble breathing. Still coughing and having difficulty actually clearing cough. No chest pain.   Objective: Vitals:   01/27/20 2113 01/28/20 0432 01/28/20 0844 01/28/20 1000  BP: 103/65 (!) 120/56    Pulse: 71 72    Resp: 18 16    Temp: 97.9 F (36.6 C) 98.2 F (36.8 C)    TempSrc: Oral Oral    SpO2: 100% 94% 91% 99%  Weight:      Height:        Intake/Output Summary (Last 24 hours) at 01/28/2020 1354 Last data filed at 01/28/2020 1000 Gross per 24 hour  Intake 200 ml  Output 850 ml  Net -650 ml   Filed Weights   01/23/20 0500 01/24/20 0522 01/26/20 0500  Weight: 76.3 kg 75 kg 74.5 kg   Gen: Elderly, frail female in no distress Pulm: Nonlabored breathing 1L O2, no wheezes or crackles noted. Diminished at bases. CV: Regular rate and rhythm. No murmur, rub, or gallop. No JVD, no pitting dependent edema. GI: Abdomen soft, non-tender, non-distended, with normoactive bowel sounds.  Ext: Warm, no deformities Skin: No rashes, lesions or ulcers on visualized skin. Neuro: Drowsy but rousable and  oriented. Very weak, unable to keep head up and unable to assist with rising to sit for pulmonary exam. This is worse than previously, though no focal neurological deficits are noted. Psych: Judgement and insight appear fair. Mood euthymic & affect congruent. Behavior is appropriate.     Data Reviewed: I have personally reviewed following labs and imaging studies  CBC: Recent Labs  Lab 01/23/20 0502 01/24/20 0446 01/25/20 0459 01/26/20 0420 01/28/20 0725  WBC 9.5 11.0* 10.8* 10.9* 9.6  NEUTROABS   --   --   --  8.4* 7.3  HGB 12.0 12.5 12.4 13.3 13.9  HCT 38.4 39.5 39.9 41.4 44.8  MCV 99.7 98.8 99.0 97.6 99.6  PLT 315 327 342 357 161   Basic Metabolic Panel: Recent Labs  Lab 01/22/20 0515 01/22/20 1738 01/23/20 0502 01/24/20 0446 01/25/20 0459 01/26/20 0420 01/28/20 0725  NA   < >  --  141 140 138 135 137  K   < > 4.3 4.4 4.9 4.8 4.8 5.6*  CL   < >  --  106 105 105 101 100  CO2   < >  --  26 26 23 25 25   GLUCOSE   < >  --  114* 136* 122* 144* 121*  BUN   < >  --  33* 39* 36* 34* 46*  CREATININE   < >  --  1.04* 1.10* 1.11* 1.10* 1.20*  CALCIUM   < >  --  8.8* 9.1 9.0 9.0 9.0  MG  --  1.9  --   --   --   --   --    < > = values in this interval not displayed.   GFR: Estimated Creatinine Clearance: 34.8 mL/min (A) (by C-G formula based on SCr of 1.2 mg/dL (H)). Liver Function Tests: No results for input(s): AST, ALT, ALKPHOS, BILITOT, PROT, ALBUMIN in the last 168 hours. No results for input(s): LIPASE, AMYLASE in the last 168 hours. No results for input(s): AMMONIA in the last 168 hours. Coagulation Profile: No results for input(s): INR, PROTIME in the last 168 hours. Cardiac Enzymes: No results for input(s): CKTOTAL, CKMB, CKMBINDEX, TROPONINI in the last 168 hours. BNP (last 3 results) No results for input(s): PROBNP in the last 8760 hours. HbA1C: No results for input(s): HGBA1C in the last 72 hours. CBG: No results for input(s): GLUCAP in the last 168 hours. Lipid Profile: No results for input(s): CHOL, HDL, LDLCALC, TRIG, CHOLHDL, LDLDIRECT in the last 72 hours. Thyroid Function Tests: No results for input(s): TSH, T4TOTAL, FREET4, T3FREE, THYROIDAB in the last 72 hours. Anemia Panel: No results for input(s): VITAMINB12, FOLATE, FERRITIN, TIBC, IRON, RETICCTPCT in the last 72 hours. Urine analysis:    Component Value Date/Time   COLORURINE YELLOW 01/17/2020 2130   APPEARANCEUR HAZY (A) 01/17/2020 2130   LABSPEC 1.010 01/17/2020 2130   PHURINE 5.5  01/17/2020 2130   GLUCOSEU NEGATIVE 01/17/2020 2130   HGBUR TRACE (A) 01/17/2020 2130   BILIRUBINUR NEGATIVE 01/17/2020 2130   KETONESUR NEGATIVE 01/17/2020 2130   PROTEINUR NEGATIVE 01/17/2020 2130   UROBILINOGEN 1.0 11/25/2014 0220   NITRITE POSITIVE (A) 01/17/2020 2130   LEUKOCYTESUR SMALL (A) 01/17/2020 2130   No results found for this or any previous visit (from the past 240 hour(s)).    Radiology Studies: No results found.  Scheduled Meds:  atorvastatin  20 mg Oral Daily   enoxaparin (LOVENOX) injection  40 mg Subcutaneous Q24H   furosemide  40 mg Oral Daily  guaiFENesin  600 mg Oral BID   ipratropium-albuterol  3 mL Nebulization BID   levothyroxine  50 mcg Oral QAC breakfast   lisinopril  5 mg Oral Daily   mouth rinse  15 mL Mouth Rinse BID   metoprolol succinate  50 mg Oral BID   mirabegron ER  50 mg Oral Daily   pantoprazole  40 mg Oral Daily   sodium chloride flush  3 mL Intravenous Q12H   Continuous Infusions:  sodium chloride       LOS: 11 days   Time spent: 25 minutes.  Patrecia Pour, MD [facility] [email or web address] 01/28/2020, 1:54 PM

## 2020-01-29 LAB — BASIC METABOLIC PANEL
Anion gap: 10 (ref 5–15)
BUN: 62 mg/dL — ABNORMAL HIGH (ref 8–23)
CO2: 27 mmol/L (ref 22–32)
Calcium: 9.3 mg/dL (ref 8.9–10.3)
Chloride: 100 mmol/L (ref 98–111)
Creatinine, Ser: 1.5 mg/dL — ABNORMAL HIGH (ref 0.44–1.00)
GFR calc Af Amer: 36 mL/min — ABNORMAL LOW (ref >60)
GFR calc non Af Amer: 31 mL/min — ABNORMAL LOW (ref >60)
Glucose, Bld: 126 mg/dL — ABNORMAL HIGH (ref 70–99)
Potassium: 5 mmol/L (ref 3.5–5.1)
Sodium: 137 mmol/L (ref 135–145)

## 2020-01-29 MED ORDER — ENOXAPARIN SODIUM 30 MG/0.3ML ~~LOC~~ SOLN
30.0000 mg | SUBCUTANEOUS | Status: DC
  Administered 2020-01-29 – 2020-01-30 (×2): 30 mg via SUBCUTANEOUS
  Filled 2020-01-29 (×2): qty 0.3

## 2020-01-29 NOTE — Progress Notes (Signed)
Daily Progress Note   Patient Name: Betty Chan       Date: 01/29/2020 DOB: 09-27-1929  Age: 86 y.o. MRN#: [id number] Attending Physician: Deatra James, MD Primary Care Physician: Maurice Small, MD Admit Date: 01/17/2020  Reason for Consultation/Follow-up: Establishing goals of care  Subjective:  patient is resting in bed, awakens easily, no longer on supplemental O2 [place].    Discussed with bedside RN, son and daughter are also at the bedside. Able to take her PO medications   Length of Stay: 12  Current Medications: Scheduled Meds:  . atorvastatin  20 mg Oral Daily  . enoxaparin (LOVENOX) injection  40 mg Subcutaneous Q24H  . furosemide  40 mg Oral Daily  . guaiFENesin  600 mg Oral BID  . ipratropium-albuterol  3 mL Nebulization BID  . levothyroxine  50 mcg Oral QAC breakfast  . lisinopril  5 mg Oral Daily  . mouth rinse  15 mL Mouth Rinse BID  . metoprolol succinate  50 mg Oral BID  . mirabegron ER  50 mg Oral Daily  . pantoprazole  40 mg Oral Daily  . sodium chloride flush  3 mL Intravenous Q12H    Continuous Infusions: . sodium chloride      PRN Meds: sodium chloride, acetaminophen, capsaicin, hydrALAZINE, levalbuterol, sodium chloride, sodium chloride flush  Physical Exam         Awakens easily, appears to be resting.  No distress Appears frail and weak  has been weaned off of O 2, now on room air.  Regular work of breathing, does get rapid breathing at times.  No edema  Vital Signs: BP 114/64 (BP Location: Left Arm)   Pulse 85   Temp 97.7 F (36.5 C) (Oral)   Resp 16   Ht 5\' 7"  (1.702 m)   Wt 74.5 kg   SpO2 93%   BMI 25.72 kg/m  SpO2: SpO2: 93 % O2 Device: O2 Device: Room Air O2 Flow Rate: O2 Flow Rate (L/min): 1 L/min  Intake/output summary:     Intake/Output Summary (Last 24 hours) at 01/29/2020 1148 Last data filed at 01/29/2020 0536 Gross per 24 hour  Intake 400 ml  Output 450 ml  Net -50 ml   LBM: Last BM Date: 01/28/20 Baseline Weight: Weight: 78 kg Most recent weight: Weight: 74.5 kg  Palliative Assessment/Data: PPS 30%     Patient Active Problem List   Diagnosis Date Noted  . Dyspnea   . Goals of care, counseling/discussion   . General weakness   . Parkinsonism ([place])   . Palliative care by specialist   . Pressure injury of skin 01/18/2020  . Acute respiratory failure with hypoxia ([place]) 01/17/2020  . Acute exacerbation of CHF (congestive heart failure) ([place]) 01/17/2020  . Gait instability 01/17/2016  . Routine general medical examination at a health care facility 07/20/2015  . Chronic venous insufficiency 01/03/2015  . Elevated troponin 11/24/2014  . Osteoarthritis of right lower extremity 11/13/2014  . Osteopenia 02/13/2014  . Essential hypertension   . History of breast cancer 11/09/2013  . Seizures ([place]) 10/04/2013  . H/O aortic valve replacement TAVR 09/07/2013  . Hypothyroid   . Hyperlipidemia   . PHN (postherpetic neuralgia)   . GERD (gastroesophageal reflux disease)   . OAB (overactive bladder)     Palliative Care Assessment & Plan   Patient Profile:  86 year old female with history of severe aortic stenosis status post TAVR in 2015, breast CA status post lumpectomy, hypertension, hyperlipidemia, hypothyroidism, GERD presented with exertional dyspnea    Assessment:  aspiration PNA, wasn't on home O2, now on 4L O2 [place].  Acute on chronic combined systolic/diastolic CHF E coli UTI.  Parkinson's disease.   Recommendations/Plan:  Goals of care and hospital course discussed with son and daughter at bedside. Reviewed current plans for SNF to attempt some rehab, CSW working on bed placement. Awaiting [facility] facility bed availability. Has approval from [facility], how ever [employer]  has no visitor policy due to increasing COVID cases and is no longer an acceptable choice as per my discussions with patient's daughter.  We discussed about patient's current condition, her underlying conditions and scope of current hospitalization. Goals, wishes and values important to patient and family attempted to be explored.   outside the patient's room, patient's daughter discussed with me about DNR DNI. Goals are to continue to make efforts towards stabilization and recovery, how ever, at end of life, patient doesn't desire CPR intubation or other heroic measures.   Code Status:    Code Status Orders  (From admission, onward)         Start     Ordered   01/17/20 2029  Full code  Continuous        01/17/20 2030        Code Status History    Date Active Date Inactive Code Status Order ID Comments User Context   11/24/2014 1727 11/26/2014 1657 Full Code [id number]  [facility] Inpatient   Advance Care Planning Activity    Advance Directive Documentation     Most Recent Value  Type of Advance Directive Living will  Pre-existing out of facility DNR order (yellow form or pink MOST form) --  "MOST" Form in Place? --       Prognosis:   Unable to determine  Discharge Planning:  Recommend palliative care follow up at her facility.   Care plan was discussed with daughter son and bedside RN.   Thank you for allowing the Palliative Medicine Team to assist in the care of this patient.   Time In: 11 Time Out: 11.35 Total Time 35 Prolonged Time Billed No       Greater than 50%  of this time was spent counseling and coordinating care related to the above assessment and plan.  Loistine Chance, MD  Please contact Palliative Medicine Team phone at [phone] for questions and concerns.

## 2020-01-29 NOTE — Progress Notes (Signed)
PROGRESS NOTE  Betty Chan  SAY:[id number] DOB: 06-10-1930 DOA: 01/17/2020 PCP: Maurice Small, MD   Brief Narrative: Betty Chan is an 86 year old female with history of AS s/p TAVR in 2015, breast CA s/p lumpectomy, HTN, HLD, hypothyroidism, GERD, and progressive supranuclear palsy who presented with exertional dyspnea  for 5 days prior to admission associated with coughing and ankle swelling.CXR showed pulmonary edema versus atypical infection. CTA chest negative for PE, showed patchy atelectasis, nonspecific peribronchial thickening and cardiomegaly, BNP elevated 504. She was admitted presumed CHF exacerbation confirmed to have LVEF 25-30%, also felt to have aspiration pneumonia and continues to be at high risk for aspiration. Cardiology was consulted, oversaw diuresis, which has been converted to oral regimen. Palliative care consulted, confirmed patient and family desire full aggressive measures but are ok with feeding with mitigation measures. Oxygen has been weaned slowly, currently at 3L O2. CXR demonstrating atelectasis without consolidation or significant pulmonary edema.   Subjective:  The patient was seen and examined this morning, stable no acute distress, sitting up in chair, about to have breakfast. Somewhat withdrawn, speaks with a low volume voice, but expresses no chest pain or discomfort at this time.  No issues overnight per nursing staff.   Assessment & Plan: Principal Problem:   Acute exacerbation of CHF (congestive heart failure) (HCC) Active Problems:   Hyperlipidemia   Essential hypertension   Elevated troponin   Acute respiratory failure with hypoxia (HCC)   Pressure injury of skin   Parkinsonism ([place])   Palliative care by specialist   Dyspnea   Goals of care, counseling/discussion   General weakness  Acute hypoxemic respiratory failure:  -She has been progressively weaned down now to 1 L Oxymizer cannula, satting 100% this AM  Due to CHF and  aspiration pneumonia. Remains despite Tx CHF. Suspect ongoing aspiration contributing to pneumonitis.  - Aspiration precautions - Wean oxygen as tolerated - Pulmonary toilet - Monitor fever curve, initiate abx if needed.  - CXR personally reviewed this AM showing no consolidation or significant pulmonary edema, most likely cause of hypoxia is atelectasis. Will attempt to wean oxygen slowly, urge incentive spirometry. D/w RN. If unable to wean, consider CTA chest, though this has risks with renal insufficiency. - Maintain euvolemia.   Acute on chronic combined HFrEF: Patient has history of severe aortic stenosis status post TAVR, echo in 2016 ([facility]) showed EF 23%, grade 1 diastolic dysfunction.  2D echo showed EF of 25 to 30% with global hypokinesis, left ventricular severely decreased function.  Patient diuresed with IV Lasix 40 mgq12 hours, now has been transitioned to oral 40 mg daily. - Continue po lasix, low-dose ACEi, metoprolol increased.  - Follow up with cardiology as an outpatient. Signed off 8/1.  Aspiration pneumonia, pneumonitis:  -Tolerating p.o., afebrile, normotensive Reviewed prior records and discussed with daughter.  It appears that patient underwent fluoroscopic swallow evaluation in 2016 when she was felt to have mild abnormality with retention of liquids around vallecula.   Seen by speech therapy in this admission and placed on dysphagia 3 diet initially however continue to have coarse breath sounds and changed to dysphagia 2 diet.  - Modified barium swallow in this admission also revealed mild aspiration risk and difficulty swallowing medications/requiring frequent cueing. - Complete course of antibiotics. Low level leukocytosis thought to be due to microaspirations. Will monitor. - SLP evaluations ongoing. - Continue nebulizer therapies  Stage IIIa CKD:  Suspected diagnosis based on available records.  - Avoid nephrotoxins. Avoid contrast if possible.  Troponin  elevation:  - No investigation currently planned per cardiology, no significant CAD on LHC 2015 - stable denies   Progressive supranuclear palsy: -Noted for progressive mobility, appears to be bedbound, wheelchair bound now  Wheelchair bound though now unable to assist with transfer. PT reevaluated 8/5 recommending lift use.  - SNF disposition planned.  Full measures desired per palliative care  E. coli UTI: Completed abx  Hypothyroidism: TSH 1.6.  - Continue synthroid.  RN Pressure Injury Documentation: -Continue wound care monitoring Pressure Injury 01/17/20 Heel Right Stage 1 -  Intact skin with non-blanchable redness of a localized area usually over a bony prominence. Right Heel stage 1 pressure injury (Active)  01/17/20 2325  Location: Heel  Location Orientation: Right  Staging: Stage 1 -  Intact skin with non-blanchable redness of a localized area usually over a bony prominence.  Wound Description (Comments): Right Heel stage 1 pressure injury  Present on Admission: Yes   DVT prophylaxis: Lovenox Code Status: Remains as full code-palliative consulted Family Communication: Daughter by phone this afternoon.  Disposition Plan:  Status is: Inpatient  Remains inpatient appropriate because:Inpatient level of care appropriate due to severity of illness, remains newly hypoxemic.   Dispo: The patient is from: [place]              Anticipated d/c is to: SNF              Anticipated d/c date is: 1 days pending ability to wean oxygen.              Patient currently is not medically stable to d/c.  Consultants:   Palliative care  Cardiology  Procedures:   Echocardiogram  Barium Swallow Study  Antimicrobials:  Vancomycin 7/29 - 7/31  Ceftriaxone 7/28 - 8/1  Cefdinir 8/2 - 8/4  Flagyl 7/29 - 8/4    Objective: Vitals:   01/28/20 2116 01/29/20 0534 01/29/20 0845 01/29/20 0850  BP: (!) 96/45 114/64    Pulse: 80 74    Resp: 16 16    Temp: 97.9 F  (36.6 C) 97.7 F (36.5 C)    TempSrc: Oral Oral    SpO2: 100%  100% 100%  Weight:      Height:        Intake/Output Summary (Last 24 hours) at 01/29/2020 0905 Last data filed at 01/29/2020 0536 Gross per 24 hour  Intake 520 ml  Output 450 ml  Net 70 ml   Filed Weights   01/23/20 0500 01/24/20 0522 01/26/20 0500  Weight: 76.3 kg 75 kg 74.5 kg      Physical Exam:   General:  Alert, oriented, cooperative, no distress; somewhat withdrawn, speaks with low-volume voice  HEENT:  Normocephalic, PERRL, otherwise with in Normal limits   Neuro:  CNII-XII intact. , normal motor and sensation, reflexes intact   Lungs:   Clear to auscultation BL, Respirations unlabored, no wheezes / crackles  Cardio:    S1/S2, RRR, No murmure, No Rubs or Gallops   Abdomen:   Soft, non-tender, bowel sounds active all four quadrants,  no guarding or peritoneal signs.  Muscular skeletal:   Severe generalized weaknesses, Limited exam - in bed, able to move all 4 extremities,   2+ pulses,  symmetric, No pitting edema  Skin:  Dry, warm to touch, negative for any Rashes, No open wounds  Wounds: Please see nursing documentation Pressure Injury 01/17/20 Heel Right Stage 1 -  Intact skin with non-blanchable redness of a localized area  usually over a bony prominence. Right Heel stage 1 pressure injury (Active)  01/17/20 2325  Location: Heel  Location Orientation: Right  Staging: Stage 1 -  Intact skin with non-blanchable redness of a localized area usually over a bony prominence.  Wound Description (Comments): Right Heel stage 1 pressure injury  Present on Admission: Yes           Data Reviewed: I have personally reviewed following labs and imaging studies  CBC: Recent Labs  Lab 01/23/20 0502 01/24/20 0446 01/25/20 0459 01/26/20 0420 01/28/20 0725  WBC 9.5 11.0* 10.8* 10.9* 9.6  NEUTROABS  --   --   --  8.4* 7.3  HGB 12.0 12.5 12.4 13.3 13.9  HCT 38.4 39.5 39.9 41.4 44.8  MCV 99.7 98.8 99.0 97.6 99.6   PLT 315 327 342 357 323   Basic Metabolic Panel: Recent Labs  Lab 01/22/20 1738 01/23/20 0502 01/25/20 0459 01/26/20 0420 01/28/20 0725 01/28/20 1520 01/29/20 0446  NA  --    < > 138 135 137 139 137  K 4.3   < > 4.8 4.8 5.6* 5.2* 5.0  CL  --    < > 105 101 100 102 100  CO2  --    < > 23 25 25 27 27   GLUCOSE  --    < > 122* 144* 121* 132* 126*  BUN  --    < > 36* 34* 46* 46* 62*  CREATININE  --    < > 1.11* 1.10* 1.20* 1.24* 1.50*  CALCIUM  --    < > 9.0 9.0 9.0 9.2 9.3  MG 1.9  --   --   --   --   --   --    < > = values in this interval not displayed.   Urine analysis:    Component Value Date/Time   COLORURINE YELLOW 01/17/2020 2130   APPEARANCEUR HAZY (A) 01/17/2020 2130   LABSPEC 1.010 01/17/2020 2130   PHURINE 5.5 01/17/2020 2130   GLUCOSEU NEGATIVE 01/17/2020 2130   HGBUR TRACE (A) 01/17/2020 2130   BILIRUBINUR NEGATIVE 01/17/2020 2130   KETONESUR NEGATIVE 01/17/2020 2130   PROTEINUR NEGATIVE 01/17/2020 2130   UROBILINOGEN 1.0 11/25/2014 0220   NITRITE POSITIVE (A) 01/17/2020 2130   LEUKOCYTESUR SMALL (A) 01/17/2020 2130   No results found for this or any previous visit (from the past 240 hour(s)).    Radiology Studies: No results found.  Scheduled Meds: . atorvastatin  20 mg Oral Daily  . enoxaparin (LOVENOX) injection  40 mg Subcutaneous Q24H  . furosemide  40 mg Oral Daily  . guaiFENesin  600 mg Oral BID  . ipratropium-albuterol  3 mL Nebulization BID  . levothyroxine  50 mcg Oral QAC breakfast  . lisinopril  5 mg Oral Daily  . mouth rinse  15 mL Mouth Rinse BID  . metoprolol succinate  50 mg Oral BID  . mirabegron ER  50 mg Oral Daily  . pantoprazole  40 mg Oral Daily  . sodium chloride flush  3 mL Intravenous Q12H   Continuous Infusions: . sodium chloride       LOS: 12 days   Time spent: 25 minutes.  Deatra James, MD [facility] [email or web address] 01/29/2020, 9:05 AM

## 2020-01-30 MED ORDER — FUROSEMIDE 20 MG PO TABS
20.0000 mg | ORAL_TABLET | Freq: Every day | ORAL | Status: DC
  Administered 2020-01-31: 20 mg via ORAL
  Filled 2020-01-30: qty 1

## 2020-01-30 NOTE — Progress Notes (Signed)
  Speech Language Pathology Treatment: Dysphagia  Patient Details Name: Betty Chan MRN: [id number] DOB: [birth year] Today's Date: 01/30/2020 Time: 9924-2683 SLP Time Calculation (min) (ACUTE ONLY): 20 min  Assessment / Plan / Recommendation Clinical Impression  Today pt is sound asleep upon arrival to room.  RN assisted to help wake pt with cold cloth and water.   RN provided pt with tsp of magic cup with pt demonstrating delayed swallow followed by cough x1.  SLP cued pt to cough strongly and attempt to expectorate.  Cough was not adequately strong.  Pt admitted to sensing residuals in pharynx and with multiple choice option she desired drink over ice chip.   No indication of aspiration with liquid swallows.  Pt was placed at 45* for po intake as it was deemed helpful during her MBS to improve pharyngeal clearance.  SLP then placed [employer] Respironics Device in pt's hand help in pt initiation of RMST.  She did not bring the trainer to her mouth despite max cues.  Tended to stare straight ahead toward end of session and did not answer SLP questions.  Pt advised she was very tired and thus SLP requested she perform RMST when she awakens later.  Pt admitted she will use trainer later today and SLP wrote reminder on her board.  RN informed pt of pt lack of consistent verbal responses to this SLP and her report of being tired.  Pt was able to see the number of fingers SLP was holding up at the end of her bed.  Recommend continue diet with strict aspiration precautions.       HPI HPI: 86yo female admitted 01/17/20 with SOB, cough, BLE edema - suspect CHF exacerbation. PMH: severe aortic stenosis s/p TAVR (2015) breast cancer, HTN, HLD, hypothyroidism, GERD, OA, osteopenia, seizure, TIA (2007). CXR = no evidence of acute disease.  Pt has h/o PSP vs Parkinsonism gleaned from prior MBS 2019 report.  Her daughter reports pt's voice and cough has been weak chronically.  Pt has good appetite PTA and does not  cough with intake per family.  Daughter also reports pt has a decayed tooth that was diagnosed aprox 3 weeks ago - plan is for extraction in the future. Last CXR showed low lung volumes.      SLP Plan  Continue with current plan of care       Recommendations  Diet recommendations: Dysphagia 2 (fine chop);Thin liquid Liquids provided via: Cup;Straw Medication Administration: Crushed with puree Supervision: Staff to assist with self feeding Compensations: Slow rate;Small sips/bites (bed at  45*) Postural Changes and/or Swallow Maneuvers: Upright 30-60 min after meal                Oral Care Recommendations: Oral care prior to ice chip/H20;Oral care QID;Staff/trained caregiver to provide oral care Follow up Recommendations: 24 hour supervision/assistance SLP Visit Diagnosis: Dysphagia, unspecified (R13.10) Plan: Continue with current plan of care       GO                Macario Golds 01/30/2020, 3:56 PM  Kathleen Lime, MS [place] [facility] [phone]

## 2020-01-30 NOTE — Care Management Important Message (Signed)
Important Message  Patient Details IM Letter given to the Patient Name: Any Mcneice MRN: [id number] Date of Birth: 11/14/1929   Medicare Important Message Given:  Yes     Kerin Salen 01/30/2020, 10:42 AM

## 2020-01-30 NOTE — Plan of Care (Signed)
  Problem: Education: Goal: Knowledge of General Education information will improve Description Including pain rating scale, medication(s)/side effects and non-pharmacologic comfort measures Outcome: Progressing   

## 2020-01-30 NOTE — TOC Progression Note (Signed)
Transition of Care [facility]) - Progression Note    Patient Details  Name: Betty Chan MRN: [id number] Date of Birth: 1930-01-20  Transition of Care [facility]) CM/SW Contact  Ross Ludwig, [place] Phone Number: 01/30/2020, 5:59 PM  Clinical Narrative:     CSW spoke with patient's daughter [name], she does not want patient to go to [facility] now.  CSW informed her that [facility] said they can not take her.  Patient's daughter requested [facility], however they can not accept her either.  Patient's daughter requested [facility], they can not take her.  Daughter then asked if [facility] can accept patient, CSW contacted [facility], they said they will review and call CSW back to let them know.  CSW contacted insurance company, patient's authorization is good through Tuesday, 01/31/20.  CSW updated patient's daughter that a decision will have to be made so CSW can let insurance company know.  CSW to follow up with patient's daughter on Tuesday.   Expected Discharge Plan: Assisted Living Barriers to Discharge: Continued Medical Work up  Expected Discharge Plan and Services Expected Discharge Plan: Assisted Living   Discharge Planning Services: CM Consult   Living arrangements for the past 2 months: Assisted Living Facility                                       Social Determinants of Health (SDOH) Interventions    Readmission Risk Interventions No flowsheet data found.

## 2020-01-30 NOTE — Progress Notes (Signed)
PROGRESS NOTE  Betty Chan  YIF:[id number] DOB: Oct 23, 1929 DOA: 01/17/2020 PCP: Maurice Small, MD   Brief Narrative: Betty Chan is an 86 year old female with history of AS s/p TAVR in 2015, breast CA s/p lumpectomy, HTN, HLD, hypothyroidism, GERD, and progressive supranuclear palsy who presented with exertional dyspnea  for 5 days prior to admission associated with coughing and ankle swelling.CXR showed pulmonary edema versus atypical infection. CTA chest negative for PE, showed patchy atelectasis, nonspecific peribronchial thickening and cardiomegaly, BNP elevated 504. She was admitted presumed CHF exacerbation confirmed to have LVEF 25-30%, also felt to have aspiration pneumonia and continues to be at high risk for aspiration. Cardiology was consulted, oversaw diuresis, which has been converted to oral regimen. Palliative care consulted, confirmed patient and family desire full aggressive measures but are ok with feeding with mitigation measures. Oxygen has been weaned slowly, currently at 3L O2. CXR demonstrating atelectasis without consolidation or significant pulmonary edema.   Subjective:  The patient was seen and examined this morning, nursing staff present Patient is more interactive. Hemodynamically stable -Mildly elevated BUN/creatinine noted -Was weaned off oxygen, currently satting 90% on room air -No issues overnight per nursing staff  Assessment & Plan: Principal Problem:   Acute exacerbation of CHF (congestive heart failure) (HCC) Active Problems:   Hyperlipidemia   Essential hypertension   Elevated troponin   Acute respiratory failure with hypoxia (HCC)   Pressure injury of skin   Parkinsonism ([place])   Palliative care by specialist   Dyspnea   Goals of care, counseling/discussion   General weakness  Acute hypoxemic respiratory failure:  -Was weaned off supplemental oxygen, currently satting 90-93% on room air  -Multifactorial, CHF, aspiration pneumonia,    -Remains despite Tx CHF. Suspect ongoing aspiration contributing to pneumonitis.  - Aspiration precautions -Weaning off oxygen but goal to maintain O2 sat> 90-92% - Pulmonary toilet - Monitor fever curve, initiate abx if needed.  - CXR personally reviewed this AM showing no consolidation or significant pulmonary edema, most likely cause of hypoxia is atelectasis. Will attempt to wean oxygen slowly, urge incentive spirometry. D/w RN. If unable to wean, consider CTA chest, though this has risks with renal insufficiency. - Maintain euvolemia.   Acute on chronic combined HFrEF:  Stable with exception of elevated creatinine Patient has history of severe aortic stenosis status post TAVR, echo in 2016 ([facility]) showed EF 86%, grade 1 diastolic dysfunction.  2D echo showed EF of 25 to 30% with global hypokinesis, left ventricular severely decreased function.  Patient diuresed with IV Lasix 40 mgq12 hours, now has been transitioned to oral 40 mg daily. - Continue po lasix, low-dose ACEi, metoprolol increased. Anticipating scaling back on Lasix and ACE inhibitor - Follow up with cardiology as an outpatient. Signed off 8/1.  Aspiration pneumonia, pneumonitis:  -Tolerating p.o., afebrile, normotensive Reviewed prior records and discussed with daughter.  It appears that patient underwent fluoroscopic swallow evaluation in 2016 when she was felt to have mild abnormality with retention of liquids around vallecula.   Seen by speech therapy in this admission and placed on dysphagia 3 diet initially however continue to have coarse breath sounds and changed to dysphagia 2 diet.  - Modified barium swallow in this admission also revealed mild aspiration risk and difficulty swallowing medications/requiring frequent cueing. - Complete course of antibiotics. Low level leukocytosis thought to be due to microaspirations. Will monitor. - SLP evaluations ongoing. - Continue nebulizer therapies  Stage IIIa CKD:   Suspected diagnosis based on available records.  -  Avoid nephrotoxins. Avoid contrast if possible. -Elevated BUN/creatinine to 62/4.5 respectively -Creatinine 1.2, 1.24, >>1.5 today   Troponin elevation:  - No investigation currently planned per cardiology, no significant CAD on LHC 2015 - stable denies   Progressive supranuclear palsy: -Noted for progressive mobility, appears to be bedbound, wheelchair bound now  Wheelchair bound though now unable to assist with transfer. PT reevaluated 8/5 recommending lift use.  - SNF disposition planned.  Full measures desired per palliative care  E. coli UTI: Completed abx  Hypothyroidism: TSH 1.6.  - Continue synthroid.  RN Pressure Injury Documentation: -Continue wound care monitoring Pressure Injury 01/17/20 Heel Right Stage 1 -  Intact skin with non-blanchable redness of a localized area usually over a bony prominence. Right Heel stage 1 pressure injury (Active)  01/17/20 2325  Location: Heel  Location Orientation: Right  Staging: Stage 1 -  Intact skin with non-blanchable redness of a localized area usually over a bony prominence.  Wound Description (Comments): Right Heel stage 1 pressure injury  Present on Admission: Yes   DVT prophylaxis: Lovenox Code Status: Remains as full code-palliative consulted Family Communication: Daughter by phone this afternoon.  Disposition Plan:  Status is: Inpatient  Remains inpatient appropriate because:Inpatient level of care appropriate due to severity of illness, remains newly hypoxemic.   Dispo: The patient is from: [place]              Anticipated d/c is to: SNF              Anticipated d/c date is: 1 days pending ability to wean oxygen.              Patient currently is hemodynamically stable --with no acute changes  Pending insurance approval for discharge to SNF  Consultants:   Palliative care  Cardiology  Procedures:   Echocardiogram  Barium Swallow  Study  Antimicrobials:  Vancomycin 7/29 - 7/31  Ceftriaxone 7/28 - 8/1  Cefdinir 8/2 - 8/4  Flagyl 7/29 - 8/4    Objective: Vitals:   01/29/20 2110 01/29/20 2333 01/30/20 0450 01/30/20 0836  BP: (!) 95/49 (!) 98/56 108/60   Pulse: 77 81 77   Resp: 18  18   Temp: 98.4 F (36.9 C)  97.9 F (36.6 C)   TempSrc: Oral  Oral   SpO2: 96%  93% 90%  Weight:   71.8 kg   Height:        Intake/Output Summary (Last 24 hours) at 01/30/2020 1111 Last data filed at 01/30/2020 0900 Gross per 24 hour  Intake 120 ml  Output 200 ml  Net -80 ml   Filed Weights   01/24/20 0522 01/26/20 0500 01/30/20 0450  Weight: 75 kg 74.5 kg 71.8 kg       Physical Exam:   General:  Alert,  cooperative, no distress;   HEENT:  Normocephalic, PERRL, otherwise with in Normal limits   Neuro:  CNII-XII intact. , normal motor and sensation, reflexes intact   Lungs:   Clear to auscultation BL, Respirations unlabored, no wheezes / crackles  Cardio:    S1/S2, RRR, No murmure, No Rubs or Gallops   Abdomen:   Soft, non-tender, bowel sounds active all four quadrants,  no guarding or peritoneal signs.  Muscular skeletal:   Severe generalized weakness noted currently bedbound  Limited exam - in bed, able to move all 4 extremities, 2+ pulses,  symmetric,   Skin:  Dry, warm to touch, negative for any Rashes,   Wounds:  Please see nursing documentation Pressure Injury 01/17/20 Heel Right Stage 1 -  Intact skin with non-blanchable redness of a localized area usually over a bony prominence. Right Heel stage 1 pressure injury (Active)  01/17/20 2325  Location: Heel  Location Orientation: Right  Staging: Stage 1 -  Intact skin with non-blanchable redness of a localized area usually over a bony prominence.  Wound Description (Comments): Right Heel stage 1 pressure injury  Present on Admission: Yes              Data Reviewed: I have personally reviewed following labs and imaging studies  CBC: Recent Labs   Lab 01/24/20 0446 01/25/20 0459 01/26/20 0420 01/28/20 0725  WBC 11.0* 10.8* 10.9* 9.6  NEUTROABS  --   --  8.4* 7.3  HGB 12.5 12.4 13.3 13.9  HCT 39.5 39.9 41.4 44.8  MCV 98.8 99.0 97.6 99.6  PLT 327 342 357 250   Basic Metabolic Panel: Recent Labs  Lab 01/25/20 0459 01/26/20 0420 01/28/20 0725 01/28/20 1520 01/29/20 0446  NA 138 135 137 139 137  K 4.8 4.8 5.6* 5.2* 5.0  CL 105 101 100 102 100  CO2 23 25 25 27 27   GLUCOSE 122* 144* 121* 132* 126*  BUN 36* 34* 46* 46* 62*  CREATININE 1.11* 1.10* 1.20* 1.24* 1.50*  CALCIUM 9.0 9.0 9.0 9.2 9.3   Urine analysis:    Component Value Date/Time   COLORURINE YELLOW 01/17/2020 2130   APPEARANCEUR HAZY (A) 01/17/2020 2130   LABSPEC 1.010 01/17/2020 2130   PHURINE 5.5 01/17/2020 2130   GLUCOSEU NEGATIVE 01/17/2020 2130   HGBUR TRACE (A) 01/17/2020 2130   BILIRUBINUR NEGATIVE 01/17/2020 2130   KETONESUR NEGATIVE 01/17/2020 2130   PROTEINUR NEGATIVE 01/17/2020 2130   UROBILINOGEN 1.0 11/25/2014 0220   NITRITE POSITIVE (A) 01/17/2020 2130   LEUKOCYTESUR SMALL (A) 01/17/2020 2130   No results found for this or any previous visit (from the past 240 hour(s)).    Radiology Studies: No results found.  Scheduled Meds: . atorvastatin  20 mg Oral Daily  . enoxaparin (LOVENOX) injection  30 mg Subcutaneous Q24H  . furosemide  40 mg Oral Daily  . guaiFENesin  600 mg Oral BID  . ipratropium-albuterol  3 mL Nebulization BID  . levothyroxine  50 mcg Oral QAC breakfast  . mouth rinse  15 mL Mouth Rinse BID  . metoprolol succinate  50 mg Oral BID  . mirabegron ER  50 mg Oral Daily  . pantoprazole  40 mg Oral Daily  . sodium chloride flush  3 mL Intravenous Q12H   Continuous Infusions: . sodium chloride       LOS: 13 days   Time spent: 25 minutes.  Deatra James, MD [facility] [email or web address] 01/30/2020, 11:11 AM

## 2020-01-31 LAB — SARS CORONAVIRUS 2 BY RT PCR (HOSPITAL ORDER, PERFORMED IN ~~LOC~~ HOSPITAL LAB): SARS Coronavirus 2: NEGATIVE

## 2020-01-31 MED ORDER — METOPROLOL SUCCINATE ER 50 MG PO TB24: 50.0000 mg | ORAL_TABLET | Freq: Two times a day (BID) | ORAL | 0 refills | Status: DC

## 2020-01-31 MED ORDER — FUROSEMIDE 20 MG PO TABS: 20.0000 mg | ORAL_TABLET | Freq: Every day | ORAL | 0 refills | Status: AC

## 2020-01-31 MED ORDER — GUAIFENESIN ER 600 MG PO TB12: 600.0000 mg | ORAL_TABLET | Freq: Two times a day (BID) | ORAL | 0 refills | Status: AC

## 2020-01-31 MED ORDER — LEVALBUTEROL HCL 0.63 MG/3ML IN NEBU: 0.6300 mg | INHALATION_SOLUTION | Freq: Four times a day (QID) | RESPIRATORY_TRACT | 12 refills | Status: AC | PRN

## 2020-01-31 MED ORDER — CAPSAICIN 0.025 % EX CREA: TOPICAL_CREAM | Freq: Three times a day (TID) | CUTANEOUS | 0 refills | Status: AC | PRN

## 2020-01-31 NOTE — Progress Notes (Signed)
Report given to [place], Therapist, sports at [facility]. Patient is ready at this time for discharge.

## 2020-01-31 NOTE — TOC Transition Note (Signed)
Transition of Care [facility]) - CM/SW Discharge Note   Patient Details  Name: Betty Chan MRN: [id number] Date of Birth: April 14, 1930  Transition [facility]) CM/SW Contact:  Ross Ludwig, LCSW Phone Number: 01/31/2020, 1:22 PM   Clinical Narrative:     CSW spoke to [facility] and they can accept patient today.  CSW updated patient's insurance company and patient's daughter Betty Chan.  Patient to be d/c'ed today to [facility].  Patient and family agreeable to plans will transport via ems RN to call report to [phone].  Patient's daughter Betty Chan is aware that she is discharging today.      Final next level of care: Skilled Nursing Facility Barriers to Discharge: Barriers Resolved   Patient Goals and CMS Choice Patient states their goals for this hospitalization and ongoing recovery are:: To go to SNF for short term rehab. CMS Medicare.gov Compare Post Acute Care list provided to:: Patient Represenative (must comment) Choice offered to / list presented to : Adult Children  Discharge Placement PASRR number recieved: 01/23/20            Patient chooses bed at: [facility] Patient to be transferred to facility by: [facility] Name of family member notified: Patient's Daughter Betty Chan [phone] Patient and family notified of of transfer: 01/31/20  Discharge Plan and Services   Discharge Planning Services: CM Consult              DME Agency: NA       HH Arranged: NA          Social Determinants of Health (SDOH) Interventions     Readmission Risk Interventions No flowsheet data found.

## 2020-01-31 NOTE — NC FL2 (Signed)
[place] LEVEL OF CARE SCREENING TOOL     IDENTIFICATION  Patient Name: Betty Chan Birthdate: 06/13/1930 Sex: female Admission Date (Current Location): 01/17/2020  [facility] and [place] Number:  Herbalist and Address:  [facility],  [place] [address] [place]      Provider Number: [id number]  Attending Physician Name and Address:  Deatra James, MD  Relative Name and Phone Number:  [name] Daughter   [phone]    Current Level of Care: Hospital Recommended Level of Care: [place] Prior Approval Number:    Date Approved/Denied:   PASRR Number: [id number]  Discharge Plan: SNF    Current Diagnoses: Patient Active Problem List   Diagnosis Date Noted  . Dyspnea   . Goals of care, counseling/discussion   . General weakness   . Parkinsonism ([place])   . Palliative care by specialist   . Pressure injury of skin 01/18/2020  . Acute respiratory failure with hypoxia ([place]) 01/17/2020  . Acute exacerbation of CHF (congestive heart failure) ([place]) 01/17/2020  . Gait instability 01/17/2016  . Routine general medical examination at a health care facility 07/20/2015  . Chronic venous insufficiency 01/03/2015  . Elevated troponin 11/24/2014  . Osteoarthritis of right lower extremity 11/13/2014  . Osteopenia 02/13/2014  . Essential hypertension   . History of breast cancer 11/09/2013  . Seizures ([place]) 10/04/2013  . H/O aortic valve replacement TAVR 09/07/2013  . Hypothyroid   . Hyperlipidemia   . PHN (postherpetic neuralgia)   . GERD (gastroesophageal reflux disease)   . OAB (overactive bladder)     Orientation RESPIRATION BLADDER Height & Weight     Self, Time, Situation, Place  Normal (3L) Incontinent Weight: 158 lb 1.1 oz (71.7 kg) Height:  5\' 7"  (170.2 cm)  BEHAVIORAL SYMPTOMS/MOOD NEUROLOGICAL BOWEL NUTRITION STATUS      Incontinent Diet (regular)  AMBULATORY STATUS COMMUNICATION OF NEEDS  Skin   Limited Assist Verbally PU Stage and Appropriate Care PU Stage 1 Dressing:  (PRN dressing changes)                     Personal Care Assistance Level of Assistance  Bathing, Feeding, Dressing Bathing Assistance: Limited assistance Feeding assistance: Independent Dressing Assistance: Limited assistance     Functional Limitations Info  Sight, Hearing, Speech Sight Info: Adequate Hearing Info: Adequate Speech Info: Adequate    SPECIAL CARE FACTORS FREQUENCY  PT (By licensed PT), OT (By licensed OT)     PT Frequency: 5x a week OT Frequency: 5x a week            Contractures Contractures Info: Not present    Additional Factors Info  Code Status, Allergies Code Status Info: DNR Allergies Info: Amoxicillin Penicillins           Current Medications (01/31/2020):  This is the current hospital active medication list Current Facility-Administered Medications  Medication Dose Route Frequency Provider Last Rate Last Admin  . 0.9 %  sodium chloride infusion  250 mL Intravenous PRN Shela Leff, MD      . acetaminophen (TYLENOL) tablet 650 mg  650 mg Oral Q4H PRN Shela Leff, MD   650 mg at 01/30/20 1045  . atorvastatin (LIPITOR) tablet 20 mg  20 mg Oral Daily Rai, Ripudeep K, MD   20 mg at 01/30/20 1044  . capsaicin (ZOSTRIX) 0.025 % cream   Topical TID PRN Mendel Corning, MD   1 application at 66/06/30 0601  .  enoxaparin (LOVENOX) injection 30 mg  30 mg Subcutaneous Q24H Lenis Noon, RPH   30 mg at 01/30/20 2243  . furosemide (LASIX) tablet 20 mg  20 mg Oral Daily Shahmehdi, Seyed A, MD      . guaiFENesin (MUCINEX) 12 hr tablet 600 mg  600 mg Oral BID Shela Leff, MD   600 mg at 01/30/20 2243  . hydrALAZINE (APRESOLINE) injection 5 mg  5 mg Intravenous Q4H PRN Shela Leff, MD   5 mg at 01/17/20 2128  . levalbuterol (XOPENEX) nebulizer solution 0.63 mg  0.63 mg Nebulization Q6H PRN Rai, Ripudeep K, MD   0.63 mg at 01/19/20 1455  .  levothyroxine (SYNTHROID) tablet 50 mcg  50 mcg Oral QAC breakfast Rai, Ripudeep K, MD   50 mcg at 01/31/20 0549  . MEDLINE mouth rinse  15 mL Mouth Rinse BID Shela Leff, MD   15 mL at 01/30/20 2243  . metoprolol succinate (TOPROL-XL) 24 hr tablet 50 mg  50 mg Oral BID Lovey Newcomer T, NP   50 mg at 01/30/20 1045  . mirabegron ER (MYRBETRIQ) tablet 50 mg  50 mg Oral Daily Rai, Ripudeep K, MD   50 mg at 01/30/20 1044  . pantoprazole (PROTONIX) EC tablet 40 mg  40 mg Oral Daily Rai, Ripudeep K, MD   40 mg at 01/30/20 1046  . sodium chloride (OCEAN) 0.65 % nasal spray 2 spray  2 spray Each Nare PRN Vance Gather B, MD      . sodium chloride flush (NS) 0.9 % injection 3 mL  3 mL Intravenous Q12H Shela Leff, MD   3 mL at 01/30/20 2327  . sodium chloride flush (NS) 0.9 % injection 3 mL  3 mL Intravenous PRN Shela Leff, MD   3 mL at 01/20/20 1326     Discharge Medications: Please see discharge summary for a list of discharge medications.  Relevant Imaging Results:  Relevant Lab Results:   Additional Information SSN [id number]  Ross Ludwig, LCSW

## 2020-01-31 NOTE — Plan of Care (Signed)
  Problem: Elimination: Goal: Will not experience complications related to bowel motility Outcome: Progressing Goal: Will not experience complications related to urinary retention Outcome: Progressing   Problem: Pain Managment: Goal: General experience of comfort will improve Outcome: Progressing   

## 2020-01-31 NOTE — Progress Notes (Signed)
Attempted to call report x2 to [facility] at given number [phone] with no answer x2.  Will attempt again.

## 2020-01-31 NOTE — Progress Notes (Signed)
Physical Therapy Treatment Patient Details Name: Betty Chan MRN: [id number] DOB: 1929-10-17 Today's Date: 01/31/2020    History of Present Illness Patient is 86 year old female with history of severe aortic stenosis status post TAVR in 2015, breast CA status post lumpectomy, hypertension, hyperlipidemia, hypothyroidism, GERD presented with shortness of breath.Chest x-ray showed pulmonary edema versus atypical infection.  CTA chest negative for PE, showed patchy atelectasis, nonspecific peribronchial thickening and cardiomegaly    PT Comments    Pt attempting to assist as able today and requiring less assist then previous session.  Pt able to sit EOB for a few minutes and maintain improved posture briefly.  Pt fatigued quickly though and requested return to supine.  Daughter present today and reports pt min-mod assist for transfer to w/c at home.  She states pt assists with her upper body strength during transfers holding armrests (does not typically use RW).  Pt smiling a few times during session as well.  Continue to recommend SNF upon d/c.   Follow Up Recommendations  SNF     Equipment Recommendations  None recommended by PT    Recommendations for Other Services       Precautions / Restrictions Precautions Precautions: Fall Precaution Comments: incontinent of bowel and bladder    Mobility  Bed Mobility Overal bed mobility: Needs Assistance Bed Mobility: Supine to Sit;Sit to Supine Rolling: Min assist   Supine to sit: Mod assist;+2 for safety/equipment Sit to supine: Max assist;+2 for safety/equipment   General bed mobility comments: pt assisting with lower body and pulling herself upright with bed rail and therapist hand, mostly assist for scooting using bed pad, more assist for back to bed due to fatigue  Transfers                 General transfer comment: fatigued quickly, typically able to stand for transfers per daughter  Ambulation/Gait                  Stairs             Wheelchair Mobility    Modified Rankin (Stroke Patients Only)       Balance Overall balance assessment: Needs assistance Sitting-balance support: Bilateral upper extremity supported;Feet supported Sitting balance-Leahy Scale: Poor Sitting balance - Comments: mostly maintains kyphotic posterior with posterior pelvic tilt and forward head howeve able to briefly hold improved posture, more difficulty with cervical extension Postural control: Posterior lean                                  Cognition Arousal/Alertness: Awake/alert Behavior During Therapy: WFL for tasks assessed/performed                                   General Comments: pt alert and following simple commands as able, typically nods to answer questions however will answer with one word at times as well      Exercises      General Comments        Pertinent Vitals/Pain Pain Assessment: Faces Faces Pain Scale: No hurt Pain Intervention(s): Monitored during session;Repositioned    Home Living Family/patient expects to be discharged to:: Assisted living   Available Help at Discharge: Personal care attendant;Available 24 hours/day Type of Home: Apartment Home Access: Level entry     Home Equipment: Walker - 2 wheels;Wheelchair - manual  Prior Function Level of Independence: Needs assistance  Gait / Transfers Assistance Needed: requires min-mod assist to transfer to w/c and then able to perform w/c mobility       PT Goals (current goals can now be found in the care plan section) Progress towards PT goals: Progressing toward goals    Frequency    Min 2X/week      PT Plan Current plan remains appropriate    Co-evaluation              AM-PAC PT "6 Clicks" Mobility   Outcome Measure  Help needed turning from your back to your side while in a flat bed without using bedrails?: A Lot Help needed moving from lying on your back  to sitting on the side of a flat bed without using bedrails?: A Lot Help needed moving to and from a bed to a chair (including a wheelchair)?: A Lot Help needed standing up from a chair using your arms (e.g., wheelchair or bedside chair)?: Total Help needed to walk in hospital room?: Total Help needed climbing 3-5 steps with a railing? : Total 6 Click Score: 9    End of Session   Activity Tolerance: Patient limited by fatigue Patient left: in bed;with call bell/phone within reach;with bed alarm set;with family/visitor present   PT Visit Diagnosis: Other abnormalities of gait and mobility (R26.89);Muscle weakness (generalized) (M62.81)     Time: 8138-8719 PT Time Calculation (min) (ACUTE ONLY): 17 min  Charges:  $Therapeutic Activity: 8-22 mins                    Jannette Spanner PT, DPT Acute Rehabilitation Services Pager: [phone] Office: [phone]  Boris Engelmann,KATHrine E 01/31/2020, 10:34 AM

## 2020-01-31 NOTE — Discharge Summary (Signed)
Physician Discharge Summary [facility]    Patient: Betty Chan                   Admit date: 01/17/2020   DOB: 05-Dec-1929             Discharge date:01/31/2020/11:41 AM RJJ:[id number]                          PCP: Maurice Small, MD  Disposition: SNF  Recommendations for Outpatient Follow-up:   . Follow up: Palliative care team, milligrams to hospice  Discharge Condition: Stable   Code Status:   Code Status: DNR  Diet recommendation: Regular healthy diet   Discharge Diagnoses:    Principal Problem:   Acute exacerbation of CHF (congestive heart failure) ([place]) Active Problems:   Hyperlipidemia   Essential hypertension   Elevated troponin   Acute respiratory failure with hypoxia (HCC)   Pressure injury of skin   Parkinsonism ([place])   Palliative care by specialist   Dyspnea   Goals of care, counseling/discussion   General weakness   History of Present Illness/ Hospital Course Betty Chan Summary:   Betty Chan is an 86 year old female with history of AS s/p TAVR in 2015, breast CA s/p lumpectomy, HTN, HLD, hypothyroidism, GERD, and progressive supranuclear palsy who presented with exertional dyspnea for 5 days prior to admission associated with coughing and ankle swelling.CXR showed pulmonary edema versus atypical infection. CTA chest negative for PE, showed patchy atelectasis, nonspecific peribronchial thickening and cardiomegaly, BNP elevated 504. She was admitted presumed CHF exacerbation confirmed to have LVEF 25-30%, also felt to have aspiration pneumonia and continues to be at high risk for aspiration. Cardiology was consulted, oversaw diuresis, which has been converted to oral regimen. Palliative care consulted, confirmed patient and family desire full aggressive measures but are ok with feeding with mitigation measures. Oxygen has been weaned slowly, currently at 3L O2. CXR demonstrating atelectasis without consolidation or significant pulmonary edema.        Acute hypoxemic respiratory failure:  -Was weaned off supplemental oxygen, currently satting 90-93% on room air  -Multifactorial, CHF, aspiration pneumonia,  -Remains despite Tx CHF. Suspect ongoing aspiration contributing to pneumonitis.  - Aspiration precautions -Weaning off oxygen but goal to maintain O2 sat> 90-92% - Pulmonary toilet - Monitor fever curve, initiate abx if needed.  - CXR personally reviewed this AM showing no consolidation or significant pulmonary edema, most likely cause of hypoxia is atelectasis. Will attempt to wean oxygen slowly, urge incentive spirometry. D/w RN. If unable to wean, consider CTA chest, though this has risks with renal insufficiency. - Maintain euvolemia.   Acute on chronic combined HFrEF:  Stable with exception of elevated creatinine Patient has history of severe aortic stenosis status post TAVR, echo in 2016 ([facility]) showed EF 01%, grade 1 diastolic dysfunction. 2D echo showed EF of 25 to 30% with global hypokinesis, left ventricular severely decreased function. Patientdiuresed with IV Lasix 40 mgq12 hours, now has been transitioned to oral 40 mg daily. - Continue po lasix, low-dose ACEi, metoprolol increased. Anticipating scaling back on Lasix and ACE inhibitor - Follow up with cardiology as an outpatient. Signed off 8/1.  Aspiration pneumonia, pneumonitis:  -Tolerating p.o., afebrile, normotensive Reviewed prior records and discussed with daughter. It appears that patient underwent fluoroscopic swallow evaluation in 2016 when she was felt to have mild abnormality with retention of liquids around vallecula.  Seen by speech therapyin this admissionand placed on dysphagia 3 dietinitiallyhowever  continue to have coarse breath sounds and changed to dysphagia 2 diet. -Modified barium swallow in this admission also revealed mild aspiration risk and difficulty swallowing medications/requiring frequent cueing. - Complete course of  antibiotics. Low level leukocytosis thought to be due to microaspirations. Will monitor. - SLP evaluations ongoing. - Continue nebulizer therapies  Stage IIIa CKD:  Suspected diagnosis based on available records.  - Avoid nephrotoxins. Avoid contrast if possible. -Elevated BUN/creatinine to 62/4.5 respectively -Creatinine 1.2, 1.24, >>1.5 today   Troponin elevation:  - No investigation currently planned per cardiology, no significant CAD on LHC 2015 - stable denies   Progressive supranuclear palsy: -Noted for progressive mobility, appears to be bedbound, wheelchair bound now  Wheelchair bound though now unable to assist with transfer. PT reevaluated 8/5 recommending lift use.  - SNF disposition planned.  Full measures desired per palliative care  E. coli UTI: Completed abx  Hypothyroidism: TSH 1.6.  - Continue synthroid.  RN Pressure Injury Documentation: -Continue wound care monitoring Pressure Injury 01/17/20 Heel Right Stage 1 -  Intact skin with non-blanchable redness of a localized area usually over a bony prominence. Right Heel stage 1 pressure injury (Active)  01/17/20 2325  Location: Heel  Location Orientation: Right  Staging: Stage 1 -  Intact skin with non-blanchable redness of a localized area usually over a bony prominence.  Wound Description (Comments): Right Heel stage 1 pressure injury  Present on Admission: Yes    Code Status: DNR -palliative consulted Family Communication: Daughter and her son were updated by the bedside.  Disposition Plan:  Dispo: The patient is from: [place]  Anticipated d/c is to: SNF   Consultants:   Palliative care  Cardiology  Procedures:   Echocardiogram  Barium Swallow Study  Antimicrobials:  Vancomycin 7/29 - 7/31  Ceftriaxone 7/28 - 8/1  Cefdinir 8/2 - 8/4  Flagyl 7/29 - 8/4     Discharge Instructions:   Discharge Instructions    Activity as tolerated -  No restrictions   Complete by: As directed    Diet general   Complete by: As directed    Discharge instructions   Complete by: As directed    Follow with palliative care, may advance to hospice   Increase activity slowly   Complete by: As directed    Remove dressing in 24 hours   Complete by: As directed    Please consult wound care, continue wound care per instructions       Medication List    STOP taking these medications   aspirin 81 MG tablet   Biotin 5000 MCG Caps   carbidopa-levodopa 25-100 MG tablet Commonly known as: SINEMET IR   Centrum Flavor Burst Adult Chew   cholecalciferol 1000 units tablet Commonly known as: VITAMIN D   clindamycin 150 MG capsule Commonly known as: CLEOCIN   diclofenac sodium 1 % Gel Commonly known as: VOLTAREN   doxycycline 100 MG tablet Commonly known as: VIBRA-TABS   levothyroxine 50 MCG tablet Commonly known as: SYNTHROID   lisinopril 5 MG tablet Commonly known as: ZESTRIL   metoprolol tartrate 25 MG tablet Commonly known as: LOPRESSOR   Myrbetriq 50 MG Tb24 tablet Generic drug: mirabegron ER   omeprazole 20 MG capsule Commonly known as: PRILOSEC   oxybutynin 5 MG tablet Commonly known as: DITROPAN   TURMERIC PO     TAKE these medications   acetaminophen 325 MG tablet Commonly known as: TYLENOL Take 2 tablets (650 mg total) by mouth every 4 (  four) hours as needed for headache or mild pain. What changed:   medication strength  how much to take  when to take this  reasons to take this   atorvastatin 20 MG tablet Commonly known as: LIPITOR TAKE 1 TABLET BY MOUTH EVERY DAY AT 6 PM What changed: See the new instructions.   capsaicin 0.025 % cream Commonly known as: ZOSTRIX Apply topically 3 (three) times daily as needed (arthritis pain to knee).   furosemide 20 MG tablet Commonly known as: LASIX Take 1 tablet (20 mg total) by mouth daily. Start taking on: February 01, 2020   guaiFENesin 600 MG 12 hr  tablet Commonly known as: MUCINEX Take 1 tablet (600 mg total) by mouth 2 (two) times daily for 10 days.   levalbuterol 0.63 MG/3ML nebulizer solution Commonly known as: XOPENEX Take 3 mLs (0.63 mg total) by nebulization every 6 (six) hours as needed for wheezing or shortness of breath.   metoprolol succinate 50 MG 24 hr tablet Commonly known as: TOPROL-XL Take 1 tablet (50 mg total) by mouth 2 (two) times daily. Take with or immediately following a meal. What changed:   medication strength  how much to take  when to take this  additional instructions   mouth rinse Liqd solution 15 mLs by Mouth Rinse route 2 (two) times daily.   sodium chloride 0.65 % Soln nasal spray Commonly known as: OCEAN Place 2 sprays into both nostrils as needed for congestion.       Allergies  Allergen Reactions  . Amoxicillin Rash  . Penicillins Rash     Procedures /Studies:   DG Chest 2 View  Result Date: 01/17/2020 CLINICAL DATA:  Shortness of breath EXAM: CHEST - 2 VIEW COMPARISON:  None. FINDINGS: The cardiomediastinal silhouette is mildly enlarged in contour.Status post TAVR. Atherosclerotic calcifications the aorta. Tortuous thoracic aorta. Hiatal hernia. Elevation the RIGHT hemidiaphragm. Diffuse reticular prominence. Mild peribronchial cuffing. No pleural effusion. No pneumothorax. LEFT basilar linear opacities, likely atelectasis. Surgical clips of the RIGHT axilla. Visualized abdomen is unremarkable. Multilevel degenerative changes of the thoracic spine. IMPRESSION: 1. Constellation of findings are favored to reflect pulmonary edema scattered areas of atelectasis. Atypical infection could present similarly. Electronically Signed   By: Valentino Saxon MD   On: 01/17/2020 15:26   CT Angio Chest PE W/Cm &/Or Wo Cm  Result Date: 01/17/2020 CLINICAL DATA:  Chest pain, shortness of breath EXAM: CT ANGIOGRAPHY CHEST WITH CONTRAST TECHNIQUE: Multidetector CT imaging of the chest was  performed using the standard protocol during bolus administration of intravenous contrast. Multiplanar CT image reconstructions and MIPs were obtained to evaluate the vascular anatomy. CONTRAST:  112mL OMNIPAQUE IOHEXOL 350 MG/ML SOLN COMPARISON:  None. FINDINGS: Cardiovascular: Satisfactory opacification of the pulmonary arteries to the lobar level and some proximal segmental arteries. Evaluation is more limited at the lung bases. No evidence of pulmonary embolism. Cardiomegaly. Aortic valve replacement. No pericardial effusion. Thoracic aorta calcification. Coronary artery calcification. Mediastinum/Nodes: Nonspecific mildly enlarged right suprahilar lymph node. Esophagus is unremarkable. Lungs/Pleura: Imaging is in expiration. Patchy atelectasis is present. Probable scarring at the lung bases. Nonspecific peribronchial thickening. No pleural effusion or pneumothorax. Upper Abdomen: No acute abnormality. Musculoskeletal: No acute osseous abnormality. No significant chest wall finding. Review of the MIP images confirms the above findings. IMPRESSION: No evidence of acute pulmonary embolism within the above limitation. Imaging in expiration with patchy atelectasis. Nonspecific peribronchial thickening. Cardiomegaly. Aortic Atherosclerosis (ICD10-I70.0). Electronically Signed   By: Addison Lank.D.  On: 01/17/2020 18:15   DG CHEST PORT 1 VIEW  Result Date: 01/26/2020 CLINICAL DATA:  Aspiration pneumonitis EXAM: PORTABLE CHEST 1 VIEW COMPARISON:  January 18, 2020 FINDINGS: There is atelectatic change in the left base. No edema or airspace opacity. Heart is mildly enlarged with pulmonary vascularity normal. No adenopathy. There is an aortic valve replacement. There is aortic atherosclerosis. There are clips in the right axillary region. IMPRESSION: Left base atelectasis. Lungs elsewhere clear. Stable cardiomegaly. Postoperative changes noted. Aortic Atherosclerosis (ICD10-I70.0). Electronically Signed   By: Lowella Grip III M.D.   On: 01/26/2020 07:57   DG CHEST PORT 1 VIEW  Result Date: 01/18/2020 CLINICAL DATA:  86 year old female with history of dyspnea. EXAM: PORTABLE CHEST 1 VIEW COMPARISON:  Chest x-ray 01/17/2020. FINDINGS: Lung volumes are low. Scattered areas of interstitial prominence, similar to prior examinations. No confluent consolidative airspace disease. No pleural effusions. No evidence of pulmonary edema. No pneumothorax. Heart size is normal. The patient is rotated to the left on today's exam, resulting in distortion of the mediastinal contours and reduced diagnostic sensitivity and specificity for mediastinal pathology. Aortic atherosclerosis. Status post TAVR with Edwards Sapien valve projecting over the aortic root. Surgical clips project over the right axillary region from prior lymph node dissection. IMPRESSION: 1. Low lung volumes without radiographic evidence of acute cardiopulmonary disease. 2. Aortic atherosclerosis. 3. Postoperative changes, as above. Electronically Signed   By: Vinnie Langton M.D.   On: 01/18/2020 11:04   DG Swallowing Func-Speech Pathology  Result Date: 01/20/2020 Objective Swallowing Evaluation: Type of Study: MBS-Modified Barium Swallow Study  Patient Details Name: Ozella Comins MRN: [id number] Date of Birth: 22-Jul-1929 Today's Date: 01/20/2020 Time: SLP Start Time (ACUTE ONLY): 1205 -SLP Stop Time (ACUTE ONLY): 1239 SLP Time Calculation (min) (ACUTE ONLY): 34 min Past Medical History: Past Medical History: Diagnosis Date . Aortic stenosis   s/p TAVR 08/2013 -follows at [facility] cards [facility]) . Basal cell carcinoma  . Cancer of right breast [facility]) 2004  lumpectomy . Dyslipidemia  . GERD (gastroesophageal reflux disease)  . Heart murmur  . Hypertension  . Hypothyroid  . OA (osteoarthritis) of knee   "right" (11/24/2014) . Osteopenia 02/13/2014  DEXA 01/2014: -2.2 . PHN (postherpetic neuralgia)   L anterior thigh 2007, recurrent 04/2013 . Seizure ([place]) 2007  single event,  off Keppra since 11/2013; "not sure if it was a TIA or seizure" . Squamous cell cancer of skin of forearm  . TIA (transient ischemic attack) 2007  "not sure if it was a TIA or seizure" Past Surgical History: Past Surgical History: Procedure Laterality Date . BASAL CELL CARCINOMA EXCISION    sternum . BREAST BIOPSY Right 2004 . BREAST LUMPECTOMY Right 08/2002 X 2 . CARDIAC CATHETERIZATION  "several" . CARDIAC VALVE REPLACEMENT   . CATARACT EXTRACTION W/ INTRAOCULAR LENS  IMPLANT, BILATERAL Bilateral 03/2014-07/2014 . SQUAMOUS CELL CARCINOMA EXCISION Left 06/2014  'forearm" . TONSILLECTOMY   . TRANSCATHETER AORTIC VALVE REPLACEMENT, TRANSFEMORAL  09/06/13  [facility] HPI: 86yo female admitted 01/17/20 with SOB, cough, BLE edema - suspect CHF exacerbation. PMH: severe aortic stenosis s/p TAVR (2015) breast cancer, HTN, HLD, hypothyroidism, GERD, OA, osteopenia, seizure, TIA (2007). CXR = no evidence of acute disease.  Pt has h/o PSP vs Parkinsonism gleaned from prior MBS 2019 report.  Her daughter reports pt's voice and cough has been weak chronically.  Pt has good appetite PTA and does not cough with intake per family.  Daughter also reports pt has a decayed tooth that was  diagnosed aprox 3 weeks ago - plan is for extraction in the future. Last CXR showed low lung volumes.  Subjective: pt awake in bed Assessment / Plan / Recommendation CHL IP CLINICAL IMPRESSIONS 01/20/2020 Clinical Impression MBS completed, Full report to follow.  Patient continues with mild oropharyngeal dysphagia with sensorimotor impairments with mild worsening compared to evaluation in 06/2017.  Mild decreased oral control with oral retention spilling into pharynx.  Residuals of liquids in oral cavity were swallowed but with delay and allowed intermittent aspiration of thin. Although aspiration was trace, it did not clear with cued cough.   Delayed pharyngeal swallow initiation, anterior position of epiglottis and decreased laryngeal elevation allows laryngeal  penetration of thin/nectar and mild aspiration of thin liquids.  Chin tuck worsened laryngeal penetration.   Laying pt back to 45* compensated for decreased laryngeal closure and prevented penetration as it kept bolus posterior in pharynx.    No aspiration with nectar, pudding, cracker boluses.  No pharyngeal retention of solid or puree.   Pt did not transit tablet with pudding with 2 attempts, cued to expectorate.  Upon esophageal sweep, pt appeared with some residuals without awareness, residuals appeared with lighter shading ? secretions. Thin barium swallows appeared to help clear. Radiologist not present to confirm.     With family, pt agreeing to known chronicity of aspiration risk, recommend dys3/thin.  Medicine with puree - crushed with puree if large.  Will follow up for dysphagia management.  Recommend HOB lowered to 45* for po and remain in position for at least 30 minutes after meals.  Follow solids with liquids. SLP Visit Diagnosis Dysphagia, oropharyngeal phase (R13.12) Attention and concentration deficit following -- Frontal lobe and executive function deficit following -- Impact on safety and function Moderate aspiration risk   CHL IP TREATMENT RECOMMENDATION 01/20/2020 Treatment Recommendations Therapy as outlined in treatment plan below   Prognosis 01/20/2020 Prognosis for Safe Diet Advancement Fair Barriers to Reach Goals -- Barriers/Prognosis Comment -- CHL IP DIET RECOMMENDATION 01/20/2020 SLP Diet Recommendations Dysphagia 3 (Mech soft) solids;Thin liquid Liquid Administration via Cup;Straw Medication Administration Whole meds with liquid Compensations Slow rate;Small sips/bites Postural Changes --   CHL IP OTHER RECOMMENDATIONS 01/20/2020 Recommended Consults -- Oral Care Recommendations Oral care QID Other Recommendations Have oral suction available   CHL IP FOLLOW UP RECOMMENDATIONS 01/20/2020 Follow up Recommendations 24 hour supervision/assistance   CHL IP FREQUENCY AND DURATION 01/20/2020  Speech Therapy Frequency (ACUTE ONLY) min 2x/week Treatment Duration 1 week;2 weeks      CHL IP ORAL PHASE 01/20/2020 Oral Phase Impaired Oral - Pudding Teaspoon -- Oral - Pudding Cup -- Oral - Honey Teaspoon -- Oral - Honey Cup -- Oral - Nectar Teaspoon -- Oral - Nectar Cup Weak lingual manipulation Oral - Nectar Straw Lingual/palatal residue;Weak lingual manipulation Oral - Thin Teaspoon Weak lingual manipulation Oral - Thin Cup Weak lingual manipulation;Lingual/palatal residue Oral - Thin Straw Weak lingual manipulation;Lingual/palatal residue Oral - Puree Reduced posterior propulsion;Lingual/palatal residue Oral - Mech Soft Reduced posterior propulsion;Impaired mastication Oral - Regular -- Oral - Multi-Consistency -- Oral - Pill Other (Comment);Decreased bolus cohesion;Delayed oral transit;Weak lingual manipulation Oral Phase - Comment --  CHL IP PHARYNGEAL PHASE 01/20/2020 Pharyngeal Phase Impaired Pharyngeal- Pudding Teaspoon -- Pharyngeal -- Pharyngeal- Pudding Cup -- Pharyngeal -- Pharyngeal- Honey Teaspoon -- Pharyngeal -- Pharyngeal- Honey Cup -- Pharyngeal -- Pharyngeal- Nectar Teaspoon -- Pharyngeal -- Pharyngeal- Nectar Cup Delayed swallow initiation-vallecula;Penetration/Aspiration during swallow;Reduced airway/laryngeal closure Pharyngeal Material enters airway, remains ABOVE vocal cords then ejected  out Pharyngeal- Nectar Straw Penetration/Aspiration during swallow;Reduced airway/laryngeal closure Pharyngeal Material enters airway, remains ABOVE vocal cords and not ejected out Pharyngeal- Thin Teaspoon -- Pharyngeal -- Pharyngeal- Thin Cup Reduced airway/laryngeal closure;Reduced laryngeal elevation;Pharyngeal residue - valleculae;Penetration/Aspiration during swallow Pharyngeal Material enters airway, passes BELOW cords without attempt by patient to eject out (silent aspiration) Pharyngeal- Thin Straw Compensatory strategies attempted (with notebox);Reduced laryngeal elevation;Reduced  airway/laryngeal closure Pharyngeal Material enters airway, passes BELOW cords without attempt by patient to eject out (silent aspiration) Pharyngeal- Puree WFL Pharyngeal Material does not enter airway Pharyngeal- Mechanical Soft WFL Pharyngeal Material does not enter airway Pharyngeal- Regular -- Pharyngeal -- Pharyngeal- Multi-consistency -- Pharyngeal -- Pharyngeal- Pill NT Pharyngeal -- Pharyngeal Comment pt with TRACE laryngeal penetration of thin with HOB lowered to 45*- trace penetration x1 of  boluses with HOB lowered  CHL IP CERVICAL ESOPHAGEAL PHASE 01/20/2020 Cervical Esophageal Phase Impaired Pudding Teaspoon -- Pudding Cup -- Honey Teaspoon -- Honey Cup -- Nectar Teaspoon -- Nectar Cup -- Nectar Straw -- Thin Teaspoon -- Thin Cup -- Thin Straw -- Puree -- Mechanical Soft -- Regular -- Multi-consistency -- Pill -- Cervical Esophageal Comment Pt did not transit tablet with pudding with 2 attempts, cued to expectorate.  Upon esophageal sweep, pt appeared with some residuals without awareness, residuals appeared with lighter shading ? secretions. Thin barium swallows appeared to help clear. Radiologist not present to confirm. Betty Lime, MS [facility] SLP Acute Rehab Services Office [phone] Macario Golds 01/20/2020, 2:06 PM              ECHOCARDIOGRAM COMPLETE  Result Date: 01/19/2020    ECHOCARDIOGRAM REPORT   Patient Name:   MARYLOUISE MALLET Date of Exam: 01/18/2020 Medical Rec #:  [id number]        Height:       67.0 in Accession #:    [id number]       Weight:       169.3 lb Date of Birth:  11/15/1929       BSA:          1.884 m Patient Age:    68 years         BP:           123/50 mmHg Patient Gender: F                HR:           128 bpm. Exam Location:  Inpatient Procedure: 2D Echo, Cardiac Doppler and Color Doppler Indications:    I50.23 Acute on chronic systolic (congestive) heart failure  History:        Patient has no prior history of Echocardiogram examinations.                 TIA, Aortic  Valve Disease, Signs/Symptoms:Murmur; Risk                 Factors:Hypertension and Dyslipidemia. Hypothyroidism. Seizures.                 Cancer. GERD.                 Aortic Valve: 29 mm Edwards Sapien prosthetic, stented (TAVR)                 valve is present in the aortic position. Procedure Date: March                 2015.  Sonographer:    Jonelle Sidle Dance Referring Phys: [id number] [place]  1. Patient was tachycardic in the 120s throughout the study.  2. Compared with the echo report 0/2542, systolic function is worse. Left ventricular ejection fraction, by estimation, is 25 to 30%. The left ventricle has severely decreased function. The left ventricle demonstrates global hypokinesis. There is mild concentric left ventricular hypertrophy. Left ventricular diastolic parameters are indeterminate.  3. Right ventricular systolic function is normal. The right ventricular size is normal.  4. Left atrial size was mildly dilated.  5. The mitral valve is normal in structure. Trivial mitral valve regurgitation. No evidence of mitral stenosis.  6. The aortic valve has been repaired/replaced. Aortic valve regurgitation is not visualized. No aortic stenosis is present. There is a 29 mm Edwards Sapien prosthetic (TAVR) valve present in the aortic position. Procedure Date: March 2015. Echo findings are consistent with normal structure and function of the aortic valve prosthesis. Aortic valve area, by VTI measures 1.33 cm. Aortic valve mean gradient measures 10.0 mmHg. Aortic valve Vmax measures 1.92 m/s.  7. Aortic dilatation noted. There is mild dilatation of the ascending aorta measuring 37 mm.  8. The inferior vena cava is normal in size with greater than 50% respiratory variability, suggesting right atrial pressure of 3 mmHg. FINDINGS  Left Ventricle: Compared with the echo report 12/621, systolic function is worse. Left ventricular ejection fraction, by estimation, is 25 to 30%. The left ventricle  has severely decreased function. The left ventricle demonstrates global hypokinesis. The left ventricular internal cavity size was normal in size. There is mild concentric left ventricular hypertrophy. Left ventricular diastolic parameters are indeterminate. Right Ventricle: The right ventricular size is normal. No increase in right ventricular wall thickness. Right ventricular systolic function is normal. Left Atrium: Left atrial size was mildly dilated. Right Atrium: Right atrial size was normal in size. Pericardium: There is no evidence of pericardial effusion. Mitral Valve: Mitral valve leaflet tips appear to be attached as though she had a Mitraclip or Alfieri stitch. The mitral valve is normal in structure. Normal mobility of the mitral valve leaflets. Mild mitral annular calcification. Trivial mitral valve regurgitation. No evidence of mitral valve stenosis. Tricuspid Valve: The tricuspid valve is normal in structure. Tricuspid valve regurgitation is not demonstrated. No evidence of tricuspid stenosis. Aortic Valve: The aortic valve has been repaired/replaced. Aortic valve regurgitation is not visualized. No aortic stenosis is present. Aortic valve mean gradient measures 10.0 mmHg. Aortic valve peak gradient measures 14.7 mmHg. Aortic valve area, by VTI measures 1.33 cm. There is a 29 mm Edwards Sapien prosthetic, stented (TAVR) valve present in the aortic position. Procedure Date: March 2015. Echo findings are consistent with normal structure and function of the aortic valve prosthesis. Pulmonic Valve: The pulmonic valve was normal in structure. Pulmonic valve regurgitation is not visualized. No evidence of pulmonic stenosis. Aorta: Aortic dilatation noted. There is mild dilatation of the ascending aorta measuring 37 mm. Venous: The inferior vena cava is normal in size with greater than 50% respiratory variability, suggesting right atrial pressure of 3 mmHg. IAS/Shunts: No atrial level shunt detected by  color flow Doppler.  LEFT VENTRICLE PLAX 2D LVIDd:         5.10 cm LVIDs:         4.50 cm LV PW:         1.10 cm LV IVS:        1.30 cm LVOT diam:     1.90 cm LV SV:         41 LV SV Index:  22 LVOT Area:     2.84 cm  RIGHT VENTRICLE          IVC RV Basal diam:  2.50 cm  IVC diam: 1.50 cm TAPSE (M-mode): 1.2 cm LEFT ATRIUM             Index       RIGHT ATRIUM           Index LA diam:        4.60 cm 2.44 cm/m  RA Area:     12.20 cm LA Vol (A2C):   73.4 ml 38.96 ml/m RA Volume:   25.10 ml  13.32 ml/m LA Vol (A4C):   36.9 ml 19.59 ml/m LA Biplane Vol: 53.9 ml 28.61 ml/m  AORTIC VALVE AV Area (Vmax):    1.31 cm AV Area (Vmean):   1.16 cm AV Area (VTI):     1.33 cm AV Vmax:           192.00 cm/s AV Vmean:          147.000 cm/s AV VTI:            0.308 m AV Peak Grad:      14.7 mmHg AV Mean Grad:      10.0 mmHg LVOT Vmax:         88.65 cm/s LVOT Vmean:        60.050 cm/s LVOT VTI:          0.144 m LVOT/AV VTI ratio: 0.47  AORTA Ao Root diam: 3.30 cm Ao Asc diam:  3.70 cm MITRAL VALVE MV Area (PHT): 4.89 cm     SHUNTS MV Decel Time: 155 msec     Systemic VTI:  0.14 m MV E velocity: 112.50 cm/s  Systemic Diam: 1.90 cm Skeet Latch MD Electronically signed by Skeet Latch MD Signature Date/Time: 01/19/2020/6:30:29 AM    Final     Subjective:   Patient was seen and examined 01/31/2020, 11:41 AM Patient stable today. No acute distress.  No issues overnight Stable for discharge.  Discharge Exam:    Vitals:   01/30/20 2041 01/31/20 0500 01/31/20 0543 01/31/20 0727  BP:   112/61   Pulse:   70   Resp:   14   Temp:   97.7 F (36.5 C)   TempSrc:   Oral   SpO2: 94%  94% 94%  Weight:  71.7 kg    Height:        General: Pt lying comfortably in bed & appears in no obvious distress. Cardiovascular: S1 & S2 heard, RRR, S1/S2 +. No murmurs, rubs, gallops or clicks. No JVD or pedal edema. Respiratory: Clear to auscultation without wheezing, rhonchi or crackles. No increased work of  breathing. Abdominal:  Non-distended, non-tender & soft. No organomegaly or masses appreciated. Normal bowel sounds heard. CNS: Alert and oriented. No focal deficits. Extremities: Severe generalized weakness, currently bedbound.no edema, no cyanosis Pressure Injury 01/17/20 Heel Right Stage 1 -  Intact skin with non-blanchable redness of a localized area usually over a bony prominence. Right Heel stage 1 pressure injury (Active)  Date First Assessed/Time First Assessed: 01/17/20 2325   Location: Heel  Location Orientation: Right  Staging: Stage 1 -  Intact skin with non-blanchable redness of a localized area usually over a bony prominence.  Wound Description (Comments): Right ...    Assessments 01/17/2020 10:20 PM 01/30/2020 10:45 PM  Dressing Type Foam - Lift dressing to assess site every shift Foam - Lift dressing to assess site  every shift  Dressing Other (Comment) --  Dressing Change Frequency PRN --  Site / Wound Assessment Red Red  Treatment Cleansed --     No Linked orders to display       The results of significant diagnostics from this hospitalization (including imaging, microbiology, ancillary and laboratory) are listed below for reference.      Microbiology:   No results found for this or any previous visit (from the past 240 hour(s)).   Labs:   CBC: Recent Labs  Lab 01/25/20 0459 01/26/20 0420 01/28/20 0725  WBC 10.8* 10.9* 9.6  NEUTROABS  --  8.4* 7.3  HGB 12.4 13.3 13.9  HCT 39.9 41.4 44.8  MCV 99.0 97.6 99.6  PLT 342 357 295   Basic Metabolic Panel: Recent Labs  Lab 01/25/20 0459 01/26/20 0420 01/28/20 0725 01/28/20 1520 01/29/20 0446  NA 138 135 137 139 137  K 4.8 4.8 5.6* 5.2* 5.0  CL 105 101 100 102 100  CO2 23 25 25 27 27   GLUCOSE 122* 144* 121* 132* 126*  BUN 36* 34* 46* 46* 62*  CREATININE 1.11* 1.10* 1.20* 1.24* 1.50*  CALCIUM 9.0 9.0 9.0 9.2 9.3   Liver Function Tests: No results for input(s): AST, ALT, ALKPHOS, BILITOT, PROT, ALBUMIN  in the last 168 hours. BNP (last 3 results) Recent Labs    01/17/20 1547  BNP 504.5*   Cardiac Enzymes: No results for input(s): CKTOTAL, CKMB, CKMBINDEX, TROPONINI in the last 168 hours. CBG: No results for input(s): GLUCAP in the last 168 hours. Hgb A1c No results for input(s): HGBA1C in the last 72 hours. Lipid Profile No results for input(s): CHOL, HDL, LDLCALC, TRIG, CHOLHDL, LDLDIRECT in the last 72 hours. Thyroid function studies No results for input(s): TSH, T4TOTAL, T3FREE, THYROIDAB in the last 72 hours.  Invalid input(s): FREET3 Anemia work up No results for input(s): VITAMINB12, FOLATE, FERRITIN, TIBC, IRON, RETICCTPCT in the last 72 hours. Urinalysis    Component Value Date/Time   COLORURINE YELLOW 01/17/2020 2130   APPEARANCEUR HAZY (A) 01/17/2020 2130   LABSPEC 1.010 01/17/2020 2130   PHURINE 5.5 01/17/2020 2130   GLUCOSEU NEGATIVE 01/17/2020 2130   HGBUR TRACE (A) 01/17/2020 2130   BILIRUBINUR NEGATIVE 01/17/2020 2130   KETONESUR NEGATIVE 01/17/2020 2130   PROTEINUR NEGATIVE 01/17/2020 2130   UROBILINOGEN 1.0 11/25/2014 0220   NITRITE POSITIVE (A) 01/17/2020 2130   LEUKOCYTESUR SMALL (A) 01/17/2020 2130   Pressure Injury 01/17/20 Heel Right Stage 1 -  Intact skin with non-blanchable redness of a localized area usually over a bony prominence. Right Heel stage 1 pressure injury (Active)  01/17/20 2325  Location: Heel  Location Orientation: Right  Staging: Stage 1 -  Intact skin with non-blanchable redness of a localized area usually over a bony prominence.  Wound Description (Comments): Right Heel stage 1 pressure injury  Present on Admission: Yes       Time coordinating discharge: Over 55 minutes  SIGNED: Deatra James, MD, FACP, [facility],  Please use [email or web address] to Page If 7PM-7AM, please contact night-coverage [email or web address] [email or web address] 01/31/2020, 11:41 AM

## 2020-06-25 ENCOUNTER — Emergency Department (HOSPITAL_COMMUNITY): Payer: Medicare Other

## 2020-06-25 ENCOUNTER — Other Ambulatory Visit: Payer: Self-pay

## 2020-06-25 ENCOUNTER — Encounter (HOSPITAL_COMMUNITY): Payer: Self-pay | Admitting: *Deleted

## 2020-06-25 ENCOUNTER — Inpatient Hospital Stay (HOSPITAL_COMMUNITY)
Admission: EM | Admit: 2020-06-25 | Discharge: 2020-07-03 | DRG: 871 | Disposition: A | Discharge status: Skilled Nursing Facility | Payer: Medicare Other | Source: Skilled Nursing Facility | Attending: Family Medicine | Admitting: Family Medicine

## 2020-06-25 DIAGNOSIS — E039 Hypothyroidism, unspecified: Secondary | ICD-10-CM | POA: Diagnosis present

## 2020-06-25 DIAGNOSIS — Z8042 Family history of malignant neoplasm of prostate: Secondary | ICD-10-CM

## 2020-06-25 DIAGNOSIS — Z20822 Contact with and (suspected) exposure to covid-19: Secondary | ICD-10-CM | POA: Diagnosis present

## 2020-06-25 DIAGNOSIS — G9340 Encephalopathy, unspecified: Secondary | ICD-10-CM | POA: Diagnosis not present

## 2020-06-25 DIAGNOSIS — I10 Essential (primary) hypertension: Secondary | ICD-10-CM | POA: Diagnosis present

## 2020-06-25 DIAGNOSIS — N390 Urinary tract infection, site not specified: Secondary | ICD-10-CM | POA: Diagnosis not present

## 2020-06-25 DIAGNOSIS — Z853 Personal history of malignant neoplasm of breast: Secondary | ICD-10-CM

## 2020-06-25 DIAGNOSIS — I110 Hypertensive heart disease with heart failure: Secondary | ICD-10-CM | POA: Diagnosis present

## 2020-06-25 DIAGNOSIS — Z803 Family history of malignant neoplasm of breast: Secondary | ICD-10-CM

## 2020-06-25 DIAGNOSIS — Z79899 Other long term (current) drug therapy: Secondary | ICD-10-CM

## 2020-06-25 DIAGNOSIS — L89623 Pressure ulcer of left heel, stage 3: Secondary | ICD-10-CM | POA: Diagnosis present

## 2020-06-25 DIAGNOSIS — Z1611 Resistance to penicillins: Secondary | ICD-10-CM | POA: Diagnosis present

## 2020-06-25 DIAGNOSIS — Z66 Do not resuscitate: Secondary | ICD-10-CM | POA: Diagnosis present

## 2020-06-25 DIAGNOSIS — R400 Somnolence: Secondary | ICD-10-CM

## 2020-06-25 DIAGNOSIS — R059 Cough, unspecified: Secondary | ICD-10-CM

## 2020-06-25 DIAGNOSIS — A4151 Sepsis due to Escherichia coli [E. coli]: Secondary | ICD-10-CM | POA: Diagnosis not present

## 2020-06-25 DIAGNOSIS — Z823 Family history of stroke: Secondary | ICD-10-CM

## 2020-06-25 DIAGNOSIS — K219 Gastro-esophageal reflux disease without esophagitis: Secondary | ICD-10-CM | POA: Diagnosis present

## 2020-06-25 DIAGNOSIS — G9341 Metabolic encephalopathy: Secondary | ICD-10-CM | POA: Diagnosis present

## 2020-06-25 DIAGNOSIS — F0280 Dementia in other diseases classified elsewhere without behavioral disturbance: Secondary | ICD-10-CM | POA: Diagnosis present

## 2020-06-25 DIAGNOSIS — A419 Sepsis, unspecified organism: Secondary | ICD-10-CM | POA: Diagnosis present

## 2020-06-25 DIAGNOSIS — Z9981 Dependence on supplemental oxygen: Secondary | ICD-10-CM

## 2020-06-25 DIAGNOSIS — N179 Acute kidney failure, unspecified: Secondary | ICD-10-CM | POA: Diagnosis present

## 2020-06-25 DIAGNOSIS — L97519 Non-pressure chronic ulcer of other part of right foot with unspecified severity: Secondary | ICD-10-CM | POA: Diagnosis present

## 2020-06-25 DIAGNOSIS — Z880 Allergy status to penicillin: Secondary | ICD-10-CM

## 2020-06-25 DIAGNOSIS — I5022 Chronic systolic (congestive) heart failure: Secondary | ICD-10-CM | POA: Diagnosis present

## 2020-06-25 DIAGNOSIS — G231 Progressive supranuclear ophthalmoplegia [Steele-Richardson-Olszewski]: Secondary | ICD-10-CM | POA: Diagnosis present

## 2020-06-25 DIAGNOSIS — R1310 Dysphagia, unspecified: Secondary | ICD-10-CM | POA: Diagnosis present

## 2020-06-25 DIAGNOSIS — M1711 Unilateral primary osteoarthritis, right knee: Secondary | ICD-10-CM | POA: Diagnosis present

## 2020-06-25 DIAGNOSIS — B9620 Unspecified Escherichia coli [E. coli] as the cause of diseases classified elsewhere: Secondary | ICD-10-CM | POA: Diagnosis present

## 2020-06-25 DIAGNOSIS — E785 Hyperlipidemia, unspecified: Secondary | ICD-10-CM | POA: Diagnosis present

## 2020-06-25 DIAGNOSIS — R0902 Hypoxemia: Secondary | ICD-10-CM

## 2020-06-25 DIAGNOSIS — E870 Hyperosmolality and hypernatremia: Secondary | ICD-10-CM | POA: Diagnosis not present

## 2020-06-25 DIAGNOSIS — Z85828 Personal history of other malignant neoplasm of skin: Secondary | ICD-10-CM

## 2020-06-25 DIAGNOSIS — L8990 Pressure ulcer of unspecified site, unspecified stage: Secondary | ICD-10-CM | POA: Diagnosis present

## 2020-06-25 DIAGNOSIS — G20 Parkinson's disease: Secondary | ICD-10-CM | POA: Diagnosis present

## 2020-06-25 DIAGNOSIS — R4182 Altered mental status, unspecified: Secondary | ICD-10-CM | POA: Diagnosis not present

## 2020-06-25 DIAGNOSIS — L89610 Pressure ulcer of right heel, unstageable: Secondary | ICD-10-CM | POA: Diagnosis present

## 2020-06-25 DIAGNOSIS — Z952 Presence of prosthetic heart valve: Secondary | ICD-10-CM

## 2020-06-25 DIAGNOSIS — Z79890 Hormone replacement therapy (postmenopausal): Secondary | ICD-10-CM

## 2020-06-25 DIAGNOSIS — E875 Hyperkalemia: Secondary | ICD-10-CM | POA: Diagnosis present

## 2020-06-25 LAB — COMPREHENSIVE METABOLIC PANEL
ALT: 37 U/L (ref 0–44)
AST: 37 U/L (ref 15–41)
Albumin: 3.3 g/dL — ABNORMAL LOW (ref 3.5–5.0)
Alkaline Phosphatase: 89 U/L (ref 38–126)
Anion gap: 13 (ref 5–15)
BUN: 44 mg/dL — ABNORMAL HIGH (ref 8–23)
CO2: 26 mmol/L (ref 22–32)
Calcium: 9.2 mg/dL (ref 8.9–10.3)
Chloride: 100 mmol/L (ref 98–111)
Creatinine, Ser: 1.42 mg/dL — ABNORMAL HIGH (ref 0.44–1.00)
GFR, Estimated: 36 mL/min — ABNORMAL LOW (ref >60)
Glucose, Bld: 110 mg/dL — ABNORMAL HIGH (ref 70–99)
Potassium: 4.3 mmol/L (ref 3.5–5.1)
Sodium: 139 mmol/L (ref 135–145)
Total Bilirubin: 1 mg/dL (ref 0.3–1.2)
Total Protein: 7.4 g/dL (ref 6.5–8.1)

## 2020-06-25 LAB — CBC WITH DIFFERENTIAL/PLATELET
Abs Immature Granulocytes: 0.05 10*3/uL (ref 0.00–0.07)
Basophils Absolute: 0 10*3/uL (ref 0.0–0.1)
Basophils Relative: 0 %
Eosinophils Absolute: 0.1 10*3/uL (ref 0.0–0.5)
Eosinophils Relative: 1 %
HCT: 39 % (ref 36.0–46.0)
Hemoglobin: 12.7 g/dL (ref 12.0–15.0)
Immature Granulocytes: 1 %
Lymphocytes Relative: 19 %
Lymphs Abs: 1.3 10*3/uL (ref 0.7–4.0)
MCH: 32.6 pg (ref 26.0–34.0)
MCHC: 32.6 g/dL (ref 30.0–36.0)
MCV: 100 fL (ref 80.0–100.0)
Monocytes Absolute: 0.8 10*3/uL (ref 0.1–1.0)
Monocytes Relative: 11 %
Neutro Abs: 4.4 10*3/uL (ref 1.7–7.7)
Neutrophils Relative %: 68 %
Platelets: 156 10*3/uL (ref 150–400)
RBC: 3.9 MIL/uL (ref 3.87–5.11)
RDW: 15.1 % (ref 11.5–15.5)
WBC: 6.6 10*3/uL (ref 4.0–10.5)
nRBC: 0 % (ref 0.0–0.2)

## 2020-06-25 LAB — URINALYSIS, ROUTINE W REFLEX MICROSCOPIC
Bilirubin Urine: NEGATIVE
Glucose, UA: NEGATIVE mg/dL
Ketones, ur: NEGATIVE mg/dL
Nitrite: NEGATIVE
Protein, ur: NEGATIVE mg/dL
Specific Gravity, Urine: 1.015 (ref 1.005–1.030)
pH: 6.5 (ref 5.0–8.0)

## 2020-06-25 LAB — CBG MONITORING, ED: Glucose-Capillary: 114 mg/dL — ABNORMAL HIGH (ref 70–99)

## 2020-06-25 LAB — URINALYSIS, MICROSCOPIC (REFLEX): WBC, UA: >50 WBC/hpf (ref 0–5)

## 2020-06-25 LAB — LACTIC ACID, PLASMA
Lactic Acid, Venous: 2.2 mmol/L (ref 0.5–1.9)
Lactic Acid, Venous: 1.3 mmol/L (ref 0.5–1.9)

## 2020-06-25 LAB — TSH: TSH: 2.482 u[IU]/mL (ref 0.350–4.500)

## 2020-06-25 LAB — BRAIN NATRIURETIC PEPTIDE: B Natriuretic Peptide: 523.5 pg/mL — ABNORMAL HIGH (ref 0.0–100.0)

## 2020-06-25 LAB — MAGNESIUM: Magnesium: 2.1 mg/dL (ref 1.7–2.4)

## 2020-06-25 LAB — RESP PANEL BY RT-PCR (FLU A&B, COVID) ARPGX2
Influenza A by PCR: NEGATIVE
Influenza B by PCR: NEGATIVE
SARS Coronavirus 2 by RT PCR: NEGATIVE

## 2020-06-25 LAB — LIPASE, BLOOD: Lipase: 29 U/L (ref 11–51)

## 2020-06-25 LAB — AMMONIA: Ammonia: <9 umol/L — ABNORMAL LOW (ref 9–35)

## 2020-06-25 MED ORDER — SODIUM CHLORIDE 0.9 % IV SOLN
2.0000 g | Freq: Once | INTRAVENOUS | Status: AC
  Administered 2020-06-25: 2 g via INTRAVENOUS
  Filled 2020-06-25: qty 20

## 2020-06-25 MED ORDER — ACETAMINOPHEN 650 MG RE SUPP: 650.0000 mg | Freq: Four times a day (QID) | RECTAL | Status: DC | PRN

## 2020-06-25 MED ORDER — SODIUM CHLORIDE 0.9 % IV SOLN
1.0000 g | INTRAVENOUS | Status: DC
  Administered 2020-06-26 – 2020-07-01 (×6): 1 g via INTRAVENOUS
  Filled 2020-06-25 (×5): qty 1
  Filled 2020-06-25: qty 10

## 2020-06-25 MED ORDER — ENOXAPARIN SODIUM 40 MG/0.4ML ~~LOC~~ SOLN
40.0000 mg | SUBCUTANEOUS | Status: DC
  Administered 2020-06-25 – 2020-07-02 (×8): 40 mg via SUBCUTANEOUS
  Filled 2020-06-25 (×8): qty 0.4

## 2020-06-25 MED ORDER — ACETAMINOPHEN 325 MG PO TABS
650.0000 mg | ORAL_TABLET | Freq: Four times a day (QID) | ORAL | Status: DC | PRN
  Administered 2020-06-28 – 2020-07-03 (×3): 650 mg via ORAL
  Filled 2020-06-25 (×3): qty 2

## 2020-06-25 NOTE — ED Provider Notes (Signed)
[facility] DEPT Provider Note   CSN: [id number] Arrival date & time: 06/25/20  1120     History Chief Complaint  Patient presents with  . Altered Mental Status    Betty Chan is a 87 y.o. female.  The history is provided by the patient and medical records. No language interpreter was used.  Altered Mental Status Presenting symptoms: confusion and partial responsiveness   Severity:  Severe Most recent episode:  Today Episode history:  Single Timing:  Constant Progression:  Unchanged Chronicity:  New Associated symptoms: no abdominal pain, no fever, no headaches, no nausea and no vomiting     LVL 5 caveat for AMS     Past Medical History:  Diagnosis Date  . Aortic stenosis    s/p TAVR 08/2013 -follows at [facility])  . Basal cell carcinoma   . Cancer of right breast [facility]) 2004   lumpectomy  . Dyslipidemia   . GERD (gastroesophageal reflux disease)   . Heart murmur   . Hypertension   . Hypothyroid   . OA (osteoarthritis) of knee    "right" (11/24/2014)  . Osteopenia 02/13/2014   DEXA 01/2014: -2.2  . PHN (postherpetic neuralgia)    L anterior thigh 2007, recurrent 04/2013  . Seizure ([place]) 2007   single event, off Keppra since 11/2013; "not sure if it was a TIA or seizure"  . Squamous cell cancer of skin of forearm   . TIA (transient ischemic attack) 2007   "not sure if it was a TIA or seizure"    Patient Active Problem List   Diagnosis Date Noted  . Dyspnea   . Goals of care, counseling/discussion   . General weakness   . Parkinsonism ([place])   . Palliative care by specialist   . Pressure injury of skin 01/18/2020  . Acute respiratory failure with hypoxia ([place]) 01/17/2020  . Acute exacerbation of CHF (congestive heart failure) ([place]) 01/17/2020  . Gait instability 01/17/2016  . Routine general medical examination at a health care facility 07/20/2015  . Chronic venous insufficiency 01/03/2015  . Elevated troponin  11/24/2014  . Osteoarthritis of right lower extremity 11/13/2014  . Osteopenia 02/13/2014  . Essential hypertension   . History of breast cancer 11/09/2013  . Seizures ([place]) 10/04/2013  . H/O aortic valve replacement TAVR 09/07/2013  . Hypothyroid   . Hyperlipidemia   . PHN (postherpetic neuralgia)   . GERD (gastroesophageal reflux disease)   . OAB (overactive bladder)     Past Surgical History:  Procedure Laterality Date  . BASAL CELL CARCINOMA EXCISION     sternum  . BREAST BIOPSY Right 2004  . BREAST LUMPECTOMY Right 08/2002 X 2  . CARDIAC CATHETERIZATION  "several"  . CARDIAC VALVE REPLACEMENT    . CATARACT EXTRACTION W/ INTRAOCULAR LENS  IMPLANT, BILATERAL Bilateral 03/2014-07/2014  . SQUAMOUS CELL CARCINOMA EXCISION Left 06/2014   'forearm"  . TONSILLECTOMY    . TRANSCATHETER AORTIC VALVE REPLACEMENT, TRANSFEMORAL  09/06/13   [facility]     OB History   No obstetric history on file.     Family History  Problem Relation Age of Onset  . Prostate cancer Father   . Breast cancer Sister   . Stroke Mother 100  . Dementia Sister     Social History   Tobacco Use  . Smoking status: Never Smoker  . Smokeless tobacco: Never Used  . Tobacco comment: independent living with assistance  Substance Use Topics  . Alcohol use: No  .  Drug use: No    Home Medications Prior to Admission medications   Medication Sig Start Date End Date Taking? Authorizing Provider  acetaminophen (TYLENOL) 325 MG tablet Take 2 tablets (650 mg total) by mouth every 4 (four) hours as needed for headache or mild pain. 01/31/20   Shahmehdi, Valeria Batman, MD  atorvastatin (LIPITOR) 20 MG tablet TAKE 1 TABLET BY MOUTH EVERY DAY AT 6 PM Patient taking differently: Take 20 mg by mouth daily.  07/25/14   Rowe Clack, MD  capsaicin (ZOSTRIX) 0.025 % cream Apply topically 3 (three) times daily as needed (arthritis pain to knee). 01/31/20   Shahmehdi, Valeria Batman, MD  furosemide (LASIX) 20 MG tablet Take 1 tablet  (20 mg total) by mouth daily. 02/01/20 03/02/20  Shahmehdi, Valeria Batman, MD  levalbuterol (XOPENEX) 0.63 MG/3ML nebulizer solution Take 3 mLs (0.63 mg total) by nebulization every 6 (six) hours as needed for wheezing or shortness of breath. 01/31/20   Shahmehdi, Valeria Batman, MD  metoprolol succinate (TOPROL-XL) 50 MG 24 hr tablet Take 1 tablet (50 mg total) by mouth 2 (two) times daily. Take with or immediately following a meal. 01/31/20 03/01/20  Shahmehdi, Valeria Batman, MD  Mouthwashes (MOUTH RINSE) LIQD solution 15 mLs by Mouth Rinse route 2 (two) times daily. 01/31/20   Shahmehdi, Valeria Batman, MD  sodium chloride (OCEAN) 0.65 % SOLN nasal spray Place 2 sprays into both nostrils as needed for congestion. 01/31/20   Deatra James, MD    Allergies    Amoxicillin and Penicillins  Review of Systems   Review of Systems  Unable to perform ROS: Mental status change  Constitutional: Positive for fatigue. Negative for chills and fever.  Respiratory: Positive for cough, chest tightness and shortness of breath. Negative for wheezing.   Cardiovascular: Negative for chest pain.  Gastrointestinal: Negative for abdominal pain, constipation, diarrhea, nausea and vomiting.  Genitourinary: Negative for dysuria and frequency.  Musculoskeletal: Negative for back pain.  Skin: Positive for wound (in R foot/heel).  Neurological: Negative for headaches.  Psychiatric/Behavioral: Positive for confusion.    Physical Exam Updated Vital Signs BP 117/61 (BP Location: Right Arm)   Pulse (!) 44   Temp 97.9 F (36.6 C) (Oral)   Resp 15   SpO2 99%   Physical Exam Vitals and nursing note reviewed.  Constitutional:      General: She is not in acute distress.    Appearance: She is well-developed and well-nourished. She is ill-appearing. She is not toxic-appearing or diaphoretic.  HENT:     Head: Normocephalic and atraumatic.     Right Ear: External ear normal.     Left Ear: External ear normal.     Nose: Nose normal.      Mouth/Throat:     Mouth: Oropharynx is clear and moist. Mucous membranes are dry.     Pharynx: No oropharyngeal exudate.  Eyes:     Extraocular Movements: EOM normal.     Conjunctiva/sclera: Conjunctivae normal.     Pupils: Pupils are equal, round, and reactive to light.  Cardiovascular:     Rate and Rhythm: Bradycardia present. Rhythm irregular.     Heart sounds: No murmur heard.   Pulmonary:     Effort: Pulmonary effort is normal. No respiratory distress.     Breath sounds: No stridor. Rhonchi present. No wheezing.  Chest:     Chest wall: No tenderness.  Abdominal:     General: There is no distension.     Tenderness: There is  no abdominal tenderness. There is no right CVA tenderness, left CVA tenderness, guarding or rebound.  Musculoskeletal:        General: No tenderness or signs of injury.     Cervical back: Normal range of motion and neck supple. No tenderness.     Right lower leg: No edema.     Left lower leg: No edema.  Skin:    General: Skin is warm.     Capillary Refill: Capillary refill takes less than 2 seconds.     Coloration: Skin is pale.     Findings: No erythema or rash.  Neurological:     General: No focal deficit present.     Mental Status: She is alert.     Sensory: No sensory deficit.     Motor: No weakness or abnormal muscle tone.     Deep Tendon Reflexes: Reflexes are normal and symmetric.     ED Results / Procedures / Treatments   Labs (all labs ordered are listed, but only abnormal results are displayed) Labs Reviewed  COMPREHENSIVE METABOLIC PANEL - Abnormal; Notable for the following components:      Result Value   Glucose, Bld 110 (*)    BUN 44 (*)    Creatinine, Ser 1.42 (*)    Albumin 3.3 (*)    GFR, Estimated 36 (*)    All other components within normal limits  LACTIC ACID, PLASMA - Abnormal; Notable for the following components:   Lactic Acid, Venous 2.2 (*)    All other components within normal limits  AMMONIA - Abnormal; Notable  for the following components:   Ammonia <9 (*)    All other components within normal limits  BRAIN NATRIURETIC PEPTIDE - Abnormal; Notable for the following components:   B Natriuretic Peptide 523.5 (*)    All other components within normal limits  RESP PANEL BY RT-PCR (FLU A&B, COVID) ARPGX2  URINE CULTURE  CULTURE, BLOOD (ROUTINE X 2)  CULTURE, BLOOD (ROUTINE X 2)  CBC WITH DIFFERENTIAL/PLATELET  LIPASE, BLOOD  TSH  MAGNESIUM  LACTIC ACID, PLASMA  URINALYSIS, ROUTINE W REFLEX MICROSCOPIC    EKG EKG Interpretation  Date/Time:  Monday June 25 2020 13:05:27 EST Ventricular Rate:  93 PR Interval:    QRS Duration: 150 QT Interval:  394 QTC Calculation: 404 R Axis:   21 Text Interpretation: Sinus rhythm Paired ventricular premature complexes Left bundle branch block When compared to prior, similar PVC> more t wave inversion in lead V5. No STEMI Confirmed by Antony Blackbird [phone]) on 06/25/2020 1:35:14 PM  ED ECG REPORT   Date: 06/25/2020  Rate: 62  Rhythm: sinus bradycardia and premature atrial contractions (PAC)  QRS Axis: normal  Intervals: normal  ST/T Wave abnormalities: nonspecific T wave changes  Conduction Disutrbances:left bundle branch block  Narrative Interpretation:   Old EKG Reviewed: similar PAC but new t wave inversions in lead v5  I have personally reviewed the EKG tracing and agree with the computerized printout as noted.   Radiology CT Head Wo Contrast  Result Date: 06/25/2020 CLINICAL DATA:  Persistently worsening mental status changes. EXAM: CT HEAD WITHOUT CONTRAST TECHNIQUE: Contiguous axial images were obtained from the base of the skull through the vertex without intravenous contrast. COMPARISON:  Report from study 11/24/2014 FINDINGS: Brain: Age related volume loss. Chronic small-vessel ischemic changes of the cerebral hemispheric white matter. No visible acute infarction, mass lesion, hemorrhage, hydrocephalus or extra-axial collection. Vascular:  There is atherosclerotic calcification of the major vessels at the base  of the brain. Skull: Negative Sinuses/Orbits: Clear/normal Other: None IMPRESSION: No acute finding by CT. Age related volume loss. Chronic small-vessel ischemic changes of the cerebral hemispheric white matter. Electronically Signed   By: Paulina Fusi M.D.   On: 06/25/2020 13:43   DG Chest Portable 1 View  Result Date: 06/25/2020 CLINICAL DATA:  Altered mental status, oxygen requirement, cough EXAM: PORTABLE CHEST 1 VIEW COMPARISON:  01/26/2020 FINDINGS: Similar low lung volumes and basilar opacities favored to be atelectasis. Stable cardiomegaly and previous TAVR. Remote right axillary lymph node dissection noted. No enlarging effusion or pneumothorax. Trachea midline. Aorta atherosclerotic. Exam is rotated to the left. IMPRESSION: Stable low volume exam with basilar atelectasis. Cardiomegaly without CHF Aortic Atherosclerosis (ICD10-I70.0). Electronically Signed   By: Judie Petit.  Shick M.D.   On: 06/25/2020 12:48   DG Foot Complete Right  Result Date: 06/25/2020 CLINICAL DATA:  Soft tissue wound right calcaneal region EXAM: RIGHT FOOT COMPLETE - 3+ VIEW COMPARISON:  March 30, 2018 FINDINGS: Frontal, oblique, and lateral views were obtained. Bones are diffusely osteoporotic. There is evidence of an old healed fracture of the proximal aspect of the fifth proximal phalanx. No acute fracture or dislocation evident. There are hammertoe deformities involving the second, third, fourth, and fifth digits with flexion of the second, third, fourth, and fifth PIP and DIP joints and extension of the second, third, fourth, and fifth MTP joints. There is moderately severe joint space narrowing involving the first MTP joint. No significant narrowing elsewhere. There is an inferior calcaneal spur. Soft tissue lucency is seen posterior to the inferior calcaneus. No erosive change or bony destruction. No radiopaque foreign body. IMPRESSION: Lucency along the  posteroinferior hindfoot. No radiopaque foreign body. No bony destruction or erosion. Underlying osteoporosis. Old healed fracture first proximal phalanx with remodeling. No acute fracture or dislocation. Hammertoe deformities at multiple sites. Severe narrowing first MTP joint. Inferior calcaneal spur noted. Electronically Signed   By: Bretta Bang III M.D.   On: 06/25/2020 12:49    Procedures Procedures (including critical care time)  Medications Ordered in ED Medications - No data to display  ED Course  I have reviewed the triage vital signs and the nursing notes.  Pertinent labs & imaging results that were available during my care of the patient were reviewed by me and considered in my medical decision making (see chart for details).    MDM Rules/Calculators/A&P                          Betty Chan is a 87 y.o. female with a past medical history significant for prior TAVR, CHF, CKD, hypertension, dyslipidemia, GERD, Parkinson's, thyroid disease, prior breast cancer, TIA, seizures, recurrent urinary tract infection, and a wound on her foot with a VAC in place who presents for altered mental status.  According to EMS report to nursing, patient was at her baseline last night.  This morning, patient was acting abnormal and was brought in for evaluation.  She is arousable to loud voice and painful stimuli but is normally talkative and not somnolent.  She shakes her head no to being in pain but does nod her head yes to some shortness of breath and cough.  EMS reports they had to double her home oxygen requirement of 2 L up to 4 L to maintain oxygen saturations in the 90s.  Otherwise patient is shaking her head yes to feeling fatigued.  On exam, lungs have coarse breath sounds bilaterally.  She has a murmur.  Chest and abdomen are nontender to palpation.  Bowel sounds were appreciated.  Patient is able to move all extremities with significant encouragement.  She has a wound VAC on her  right heel that is nontender.  Pupils are symmetric and reactive.  She was unable to do finger-nose-finger testing or even extraocular movement testing but is shaking her head yes and no answers.  Patient appears ill and is having bradycardia with numerous PVCs.  EKG shows a sinus bradycardia with PACs.  No STEMI seen.  Clinically I am concerned about the patient's cause of altered mental status.  With her-requirement, coarse breath sounds, cough, will make sure she is not having pneumonia.  We will check her for Covid given the ongoing pandemic.  We will have her look for other sources of infection and altered mental status as she has a documented history of recurrent urinary tract infections.  We will get urinalysis and will also get x-ray of the foot where she has her wound VAC in place to look for evidence of osteomyelitis development.  We will get a head CT given the altered mental status as well.  We will get a TSH level with her history of thyroid disease and the bradycardia and fatigue.  Anticipate reassessment after work-up to determine disposition.  Patient's work-up again returned.  She is Covid and flu negative on PCR.  No leukocytosis or anemia.  Still waiting on urinalysis.  Lactic acid slightly elevated, will trend.  Ammonia not elevated and TSH is normal.  Chest x-ray did not show pneumonia.  CT head did not show bleed or acute intracranial normality however, with her history of TIA and altered mental status reported by her daughter, we will get MRI to make sure there is not a stroke component.  Daughter is concerned about possible delirium as she has had some hallucinations and altered mental status on and off the last few days.  Daughter says that she has had some delirium in the setting of UTIs recently and is still concerned about urine.  We will wait for urinalysis.  Anticipate shared decision-making conversation with family about disposition with her altered mental status if work-up  is completely reassuring after the MRI and urinalysis are completed.  Care transferred to oncoming team while waiting for work-up results to be completed.   Final Clinical Impression(s) / ED Diagnoses Final diagnoses:  Altered mental status, unspecified altered mental status type  Somnolence  Cough     Clinical Impression: 1. Altered mental status, unspecified altered mental status type   2. Somnolence   3. Cough     Disposition: Care transferred to oncoming team while waiting for results prior to shared decision-making conversation about disposition.  If patient is still altered and/or delirious and we discover source infection such as UTI, would consider admission.  If her work-up is otherwise reassuring, given her Covid negative status, would consider discharge back to facility to minimize further exposure if her work-up does not show concerning findings.   This note was prepared with assistance of Systems analyst. Occasional wrong-word or sound-a-like substitutions may have occurred due to the inherent limitations of voice recognition software.      Vonne Mcdanel, Gwenyth Allegra, MD 06/25/20 [phone]

## 2020-06-25 NOTE — ED Triage Notes (Signed)
BIB by EMS from [facility], normal per usual last night, today not following commands, grimise when trying to move.  EMS noted frequent PVC's wound on foot with wound vac. History of UTI's. #22 L wrist with NS. On 02 cont. Increased to 4L 100 % CBG 177-110/50- 65.

## 2020-06-25 NOTE — ED Notes (Signed)
Patient transported to MRI 

## 2020-06-25 NOTE — ED Provider Notes (Signed)
Signout note  Received signout from Dr. Rush Landmark.  87 year old lady presented to ER with concern for increased confusion.  Nursing facility patient.  MRI negative for stroke.  UA concerning for infection.  Due to patient's change in mental status, will admit for IV antibiotics and closer observation.  Updated patient and daughter at bedside.  Discussed with Dr. Toniann Fail who accepts patient.   Milagros Loll, MD 06/25/20 2210

## 2020-06-25 NOTE — ED Notes (Signed)
Xray bedside.

## 2020-06-25 NOTE — H&P (Signed)
History and Physical    Betty Chan [id number] DOB: 08/08/1929 DOA: 06/25/2020  PCP: Maurice Small, MD  Patient coming from: [place].  Chief Complaint: Altered mental status.  History obtained from ER physician.  HPI: Betty Chan is a 87 y.o. female with known history of progressive supranuclear palsy, status post TAVR and chronic systolic heart failure, hypothyroidism dementia was brought to the ER after patient was found to be having change in mental status since morning.  As per the report patient was doing fine last night.  ED Course: In the ER patient was mildly hypotensive initially afebrile with chest x-ray showing nothing acute Covid test was negative UA is consistent with UTI.  Patient underwent MRI brain which showed nonspecific changes likely artifact which I confirmed with Dr. Leonel Ramsay neurologist.  Patient was given fluid bolus following which blood pressure improved.  And was started on ceftriaxone for UTI admitted for acute encephalopathy likely from UTI.  At the time of my exam patient is responding to her name and moving extremities.  Review of Systems: As per HPI, rest all negative.   Past Medical History:  Diagnosis Date  . Aortic stenosis    s/p TAVR 08/2013 -follows at [facility] cards [facility])  . Basal cell carcinoma   . Cancer of right breast [facility]) 2004   lumpectomy  . Dyslipidemia   . GERD (gastroesophageal reflux disease)   . Heart murmur   . Hypertension   . Hypothyroid   . OA (osteoarthritis) of knee    "right" (11/24/2014)  . Osteopenia 02/13/2014   DEXA 01/2014: -2.2  . PHN (postherpetic neuralgia)    L anterior thigh 2007, recurrent 04/2013  . Seizure ([place]) 2007   single event, off Keppra since 11/2013; "not sure if it was a TIA or seizure"  . Squamous cell cancer of skin of forearm   . TIA (transient ischemic attack) 2007   "not sure if it was a TIA or seizure"    Past Surgical History:  Procedure Laterality Date  . BASAL  CELL CARCINOMA EXCISION     sternum  . BREAST BIOPSY Right 2004  . BREAST LUMPECTOMY Right 08/2002 X 2  . CARDIAC CATHETERIZATION  "several"  . CARDIAC VALVE REPLACEMENT    . CATARACT EXTRACTION W/ INTRAOCULAR LENS  IMPLANT, BILATERAL Bilateral 03/2014-07/2014  . SQUAMOUS CELL CARCINOMA EXCISION Left 06/2014   'forearm"  . TONSILLECTOMY    . TRANSCATHETER AORTIC VALVE REPLACEMENT, TRANSFEMORAL  09/06/13   [facility]     reports that she has never smoked. She has never used smokeless tobacco. She reports that she does not drink alcohol and does not use drugs.  Allergies  Allergen Reactions  . Amoxicillin Rash  . Penicillins Rash    Family History  Problem Relation Age of Onset  . Prostate cancer Father   . Breast cancer Sister   . Stroke Mother 100  . Dementia Sister     Prior to Admission medications   Medication Sig Start Date End Date Taking? Authorizing Provider  acetaminophen (TYLENOL) 325 MG tablet Take 2 tablets (650 mg total) by mouth every 4 (four) hours as needed for headache or mild pain. 01/31/20   Shahmehdi, Valeria Batman, MD  atorvastatin (LIPITOR) 20 MG tablet TAKE 1 TABLET BY MOUTH EVERY DAY AT 6 PM Patient taking differently: Take 20 mg by mouth daily.  07/25/14   Rowe Clack, MD  capsaicin (ZOSTRIX) 0.025 % cream Apply topically 3 (three) times daily as needed (arthritis  pain to knee). 01/31/20   Shahmehdi, Gemma PayorSeyed A, MD  furosemide (LASIX) 20 MG tablet Take 1 tablet (20 mg total) by mouth daily. 02/01/20 03/02/20  Shahmehdi, Gemma PayorSeyed A, MD  levalbuterol (XOPENEX) 0.63 MG/3ML nebulizer solution Take 3 mLs (0.63 mg total) by nebulization every 6 (six) hours as needed for wheezing or shortness of breath. 01/31/20   Shahmehdi, Gemma PayorSeyed A, MD  metoprolol succinate (TOPROL-XL) 50 MG 24 hr tablet Take 1 tablet (50 mg total) by mouth 2 (two) times daily. Take with or immediately following a meal. 01/31/20 03/01/20  Shahmehdi, Gemma PayorSeyed A, MD  Mouthwashes (MOUTH RINSE) LIQD solution 15 mLs by Mouth  Rinse route 2 (two) times daily. 01/31/20   Shahmehdi, Gemma PayorSeyed A, MD  sodium chloride (OCEAN) 0.65 % SOLN nasal spray Place 2 sprays into both nostrils as needed for congestion. 01/31/20   Kendell BaneShahmehdi, Seyed A, MD    Physical Exam: Constitutional: Moderately built and nourished. Vitals:   06/25/20 1904 06/25/20 1930 06/25/20 2015 06/25/20 2045  BP: 136/60 (!) 147/71 (!) 127/54 135/72  Pulse: 97 67 77 (!) 101  Resp: 15 12 13  (!) 24  Temp: 97.9 F (36.6 C)     TempSrc: Oral     SpO2: 100% 100% 100% 100%   Eyes: Anicteric no pallor. ENMT: No discharge from the ears eyes nose or mouth. Neck: No mass felt.  No neck rigidity. Respiratory: No rhonchi or crepitations. Cardiovascular: S1-S2 heard. Abdomen: Soft nontender bowel sounds present. Musculoskeletal: No edema. Skin: No rash. Neurologic: Lethargic answer to questions moving all extremities. Psychiatric: Responding to her name.   Labs on Admission: I have personally reviewed following labs and imaging studies  CBC: Recent Labs  Lab 06/25/20 1255  WBC 6.6  NEUTROABS 4.4  HGB 12.7  HCT 39.0  MCV 100.0  PLT 156   Basic Metabolic Panel: Recent Labs  Lab 06/25/20 1255  NA 139  K 4.3  CL 100  CO2 26  GLUCOSE 110*  BUN 44*  CREATININE 1.42*  CALCIUM 9.2  MG 2.1   GFR: CrCl cannot be calculated (Unknown ideal weight.). Liver Function Tests: Recent Labs  Lab 06/25/20 1255  AST 37  ALT 37  ALKPHOS 89  BILITOT 1.0  PROT 7.4  ALBUMIN 3.3*   Recent Labs  Lab 06/25/20 1255  LIPASE 29   Recent Labs  Lab 06/25/20 1255  AMMONIA <9*   Coagulation Profile: No results for input(s): INR, PROTIME in the last 168 hours. Cardiac Enzymes: No results for input(s): CKTOTAL, CKMB, CKMBINDEX, TROPONINI in the last 168 hours. BNP (last 3 results) No results for input(s): PROBNP in the last 8760 hours. HbA1C: No results for input(s): HGBA1C in the last 72 hours. CBG: No results for input(s): GLUCAP in the last 168  hours. Lipid Profile: No results for input(s): CHOL, HDL, LDLCALC, TRIG, CHOLHDL, LDLDIRECT in the last 72 hours. Thyroid Function Tests: Recent Labs    06/25/20 1255  TSH 2.482   Anemia Panel: No results for input(s): VITAMINB12, FOLATE, FERRITIN, TIBC, IRON, RETICCTPCT in the last 72 hours. Urine analysis:    Component Value Date/Time   COLORURINE YELLOW 06/25/2020 1630   APPEARANCEUR CLOUDY (A) 06/25/2020 1630   LABSPEC 1.015 06/25/2020 1630   PHURINE 6.5 06/25/2020 1630   GLUCOSEU NEGATIVE 06/25/2020 1630   HGBUR SMALL (A) 06/25/2020 1630   BILIRUBINUR NEGATIVE 06/25/2020 1630   KETONESUR NEGATIVE 06/25/2020 1630   PROTEINUR NEGATIVE 06/25/2020 1630   UROBILINOGEN 1.0 11/25/2014 0220   NITRITE NEGATIVE 06/25/2020  1630   LEUKOCYTESUR LARGE (A) 06/25/2020 1630   Sepsis Labs: @LABRCNTIP (procalcitonin:4,lacticidven:4) ) Recent Results (from the past 240 hour(s))  Resp Panel by RT-PCR (Flu A&B, Covid) Nasopharyngeal Swab     Status: None   Collection Time: 06/25/20 12:59 PM   Specimen: Nasopharyngeal Swab; Nasopharyngeal(NP) swabs in vial transport medium  Result Value Ref Range Status   SARS Coronavirus 2 by RT PCR NEGATIVE NEGATIVE Final    Comment: (NOTE) SARS-CoV-2 target nucleic acids are NOT DETECTED.  The SARS-CoV-2 RNA is generally detectable in upper respiratory specimens during the acute phase of infection. The lowest concentration of SARS-CoV-2 viral copies this assay can detect is 138 copies/mL. A negative result does not preclude SARS-Cov-2 infection and should not be used as the sole basis for treatment or other patient management decisions. A negative result may occur with  improper specimen collection/handling, submission of specimen other than nasopharyngeal swab, presence of viral mutation(s) within the areas targeted by this assay, and inadequate number of viral copies(<138 copies/mL). A negative result must be combined with clinical observations,  patient history, and epidemiological information. The expected result is Negative.  Fact Sheet for Patients:  08/23/20  Fact Sheet for Healthcare Providers:  BloggerCourse.com  This test is no t yet approved or cleared by the [employer].[place] FDA and  has been authorized for detection and/or diagnosis of SARS-CoV-2 by FDA under an Emergency Use Authorization (EUA). This EUA will remain  in effect (meaning this test can be used) for the duration of the COVID-19 declaration under Section 564(b)(1) of the Act, 21 U.S.C.section 360bbb-3(b)(1), unless the authorization is terminated  or revoked sooner.       Influenza A by PCR NEGATIVE NEGATIVE Final   Influenza B by PCR NEGATIVE NEGATIVE Final    Comment: (NOTE) The Xpert Xpress SARS-CoV-2/FLU/RSV plus assay is intended as an aid in the diagnosis of influenza from Nasopharyngeal swab specimens and should not be used as a sole basis for treatment. Nasal washings and aspirates are unacceptable for Xpert Xpress SARS-CoV-2/FLU/RSV testing.  Fact Sheet for Patients: [place]  Fact Sheet for Healthcare Providers: BloggerCourse.com  This test is not yet approved or cleared by the [employer]ious[employer]ker.it FDA and has been authorized for detection and/or diagnosis of SARS-CoV-2 by FDA under an Emergency Use Authorization (EUA). This EUA will remain in effect (meaning this test can be used) for the duration of the COVID-19 declaration under Section 564(b)(1) of the Act, 21 U.S.C. section 360bbb-3(b)(1), unless the authorization is terminated or revoked.  Performed at [facility], [address], [place], [place]      Radiological Exams on Admission: CT Head Wo Contrast  Result Date: 06/25/2020 CLINICAL DATA:  Persistently worsening mental status changes. EXAM: CT HEAD WITHOUT CONTRAST TECHNIQUE:  Contiguous axial images were obtained from the base of the skull through the vertex without intravenous contrast. COMPARISON:  Report from study 11/24/2014 FINDINGS: Brain: Age related volume loss. Chronic small-vessel ischemic changes of the cerebral hemispheric white matter. No visible acute infarction, mass lesion, hemorrhage, hydrocephalus or extra-axial collection. Vascular: There is atherosclerotic calcification of the major vessels at the base of the brain. Skull: Negative Sinuses/Orbits: Clear/normal Other: None IMPRESSION: No acute finding by CT. Age related volume loss. Chronic small-vessel ischemic changes of the cerebral hemispheric white matter. Electronically Signed   By: 01/24/2015 M.D.   On: 06/25/2020 13:43   MR BRAIN WO CONTRAST  Result Date: 06/25/2020 CLINICAL DATA:  Mental status change EXAM: MRI  HEAD WITHOUT CONTRAST TECHNIQUE: Multiplanar, multiecho pulse sequences of the brain and surrounding structures were obtained without intravenous contrast. COMPARISON:  Head CT June 25, 2020 FINDINGS: Brain: No acute infarction, hemorrhage, hydrocephalus, extra-axial collection or mass lesion. Foci of of increased signal on the diffusion weighted images in the pontine tegmentum without corresponding low signal on ADC may be artifactual. Remote lacunar infarcts in the bilateral cerebellar hemisphere and bilateral caudate head. Scattered and confluent foci of T2 hyperintensity are seen within the white matter of the cerebral hemispheres and within the pons, nonspecific, most likely related to chronic microangiopathic changes. Mild parenchymal volume loss. Vascular: Normal flow voids. Skull and upper cervical spine: Normal marrow signal. Sinuses/Orbits: Mucosal thickening of the ethmoid cells and left maxillary sinus. Bilateral lens surgery. IMPRESSION: 1. No acute intracranial abnormality. 2. Foci of increased signal on the diffusion weighted images in the pontine tegmentum without corresponding  low signal on ADC may be artifactual. 3. Remote lacunar infarcts in the bilateral cerebellar hemisphere and bilateral caudate head. 4. Moderate chronic microangiopathic changes and mild parenchymal volume loss. Electronically Signed   By: Pedro Earls M.D.   On: 06/25/2020 18:26   DG Chest Portable 1 View  Result Date: 06/25/2020 CLINICAL DATA:  Altered mental status, oxygen requirement, cough EXAM: PORTABLE CHEST 1 VIEW COMPARISON:  01/26/2020 FINDINGS: Similar low lung volumes and basilar opacities favored to be atelectasis. Stable cardiomegaly and previous TAVR. Remote right axillary lymph node dissection noted. No enlarging effusion or pneumothorax. Trachea midline. Aorta atherosclerotic. Exam is rotated to the left. IMPRESSION: Stable low volume exam with basilar atelectasis. Cardiomegaly without CHF Aortic Atherosclerosis (ICD10-I70.0). Electronically Signed   By: Jerilynn Mages.  Shick M.D.   On: 06/25/2020 12:48   DG Foot Complete Right  Result Date: 06/25/2020 CLINICAL DATA:  Soft tissue wound right calcaneal region EXAM: RIGHT FOOT COMPLETE - 3+ VIEW COMPARISON:  March 30, 2018 FINDINGS: Frontal, oblique, and lateral views were obtained. Bones are diffusely osteoporotic. There is evidence of an old healed fracture of the proximal aspect of the fifth proximal phalanx. No acute fracture or dislocation evident. There are hammertoe deformities involving the second, third, fourth, and fifth digits with flexion of the second, third, fourth, and fifth PIP and DIP joints and extension of the second, third, fourth, and fifth MTP joints. There is moderately severe joint space narrowing involving the first MTP joint. No significant narrowing elsewhere. There is an inferior calcaneal spur. Soft tissue lucency is seen posterior to the inferior calcaneus. No erosive change or bony destruction. No radiopaque foreign body. IMPRESSION: Lucency along the posteroinferior hindfoot. No radiopaque foreign body. No  bony destruction or erosion. Underlying osteoporosis. Old healed fracture first proximal phalanx with remodeling. No acute fracture or dislocation. Hammertoe deformities at multiple sites. Severe narrowing first MTP joint. Inferior calcaneal spur noted. Electronically Signed   By: Lowella Grip III M.D.   On: 06/25/2020 12:49    EKG: Independently reviewed.  Sinus rhythm with PVCs.  Assessment/Plan Principal Problem:   Acute encephalopathy Active Problems:   Hypothyroid   Hyperlipidemia   History of breast cancer   Essential hypertension   H/O aortic valve replacement TAVR   DNR (do not resuscitate)    1. Acute encephalopathy likely from UTI for which patient is on empiric antibiotics follow cultures. 2. History of chronic systolic heart failure status post TAVR presently appears euvolemic.  Follow respiratory status closely. 3. History of hypothyroidism medication list does not show Synthroid dosing.  Will need  to confirm.  Check TSH. 4. History of hypertension Home medication needs to be confirmed. 5. History of progressive supranuclear palsy and dementia. 6. History of breast cancer. 7. DNR.  Home medication needs to be verified. Presently patient is n.p.o. we will get speech therapy for swallow evaluation.   DVT prophylaxis: Lovenox. Code Status: DNR. Family Communication: We will need to discuss with family. Disposition Plan: Back to facility when stable. Consults called: Discussed with neurologist Dr. Leonel Ramsay.  Speech therapy for swallow. Admission status: Observation.   Rise Patience MD [facility] Pager [phone].  If 7PM-7AM, please contact night-coverage [email or web address] Password [email or web address]  06/25/2020, 9:08 PM

## 2020-06-25 NOTE — ED Notes (Signed)
Date and time results received: 06/25/20 2:15 PM  Test: Lactic Acid Critical Value: 2.2  Name of Provider Notified: Tegeler  Orders Received? Or Actions Taken?:

## 2020-06-26 ENCOUNTER — Observation Stay (HOSPITAL_COMMUNITY): Payer: Medicare Other

## 2020-06-26 ENCOUNTER — Encounter (HOSPITAL_COMMUNITY): Payer: Self-pay | Admitting: Family Medicine

## 2020-06-26 ENCOUNTER — Inpatient Hospital Stay (HOSPITAL_COMMUNITY): Payer: Medicare Other

## 2020-06-26 DIAGNOSIS — Z85828 Personal history of other malignant neoplasm of skin: Secondary | ICD-10-CM | POA: Diagnosis not present

## 2020-06-26 DIAGNOSIS — Z79899 Other long term (current) drug therapy: Secondary | ICD-10-CM | POA: Diagnosis not present

## 2020-06-26 DIAGNOSIS — I10 Essential (primary) hypertension: Secondary | ICD-10-CM | POA: Diagnosis not present

## 2020-06-26 DIAGNOSIS — Z880 Allergy status to penicillin: Secondary | ICD-10-CM | POA: Diagnosis not present

## 2020-06-26 DIAGNOSIS — L89623 Pressure ulcer of left heel, stage 3: Secondary | ICD-10-CM | POA: Diagnosis present

## 2020-06-26 DIAGNOSIS — N179 Acute kidney failure, unspecified: Secondary | ICD-10-CM | POA: Diagnosis present

## 2020-06-26 DIAGNOSIS — E785 Hyperlipidemia, unspecified: Secondary | ICD-10-CM | POA: Diagnosis present

## 2020-06-26 DIAGNOSIS — Z20822 Contact with and (suspected) exposure to covid-19: Secondary | ICD-10-CM | POA: Diagnosis present

## 2020-06-26 DIAGNOSIS — Z8042 Family history of malignant neoplasm of prostate: Secondary | ICD-10-CM | POA: Diagnosis not present

## 2020-06-26 DIAGNOSIS — Z952 Presence of prosthetic heart valve: Secondary | ICD-10-CM | POA: Diagnosis not present

## 2020-06-26 DIAGNOSIS — G231 Progressive supranuclear ophthalmoplegia [Steele-Richardson-Olszewski]: Secondary | ICD-10-CM | POA: Diagnosis present

## 2020-06-26 DIAGNOSIS — N390 Urinary tract infection, site not specified: Secondary | ICD-10-CM | POA: Diagnosis present

## 2020-06-26 DIAGNOSIS — Z803 Family history of malignant neoplasm of breast: Secondary | ICD-10-CM | POA: Diagnosis not present

## 2020-06-26 DIAGNOSIS — E039 Hypothyroidism, unspecified: Secondary | ICD-10-CM | POA: Diagnosis present

## 2020-06-26 DIAGNOSIS — R4182 Altered mental status, unspecified: Secondary | ICD-10-CM | POA: Diagnosis present

## 2020-06-26 DIAGNOSIS — I5022 Chronic systolic (congestive) heart failure: Secondary | ICD-10-CM | POA: Diagnosis present

## 2020-06-26 DIAGNOSIS — M1711 Unilateral primary osteoarthritis, right knee: Secondary | ICD-10-CM | POA: Diagnosis present

## 2020-06-26 DIAGNOSIS — E870 Hyperosmolality and hypernatremia: Secondary | ICD-10-CM | POA: Diagnosis not present

## 2020-06-26 DIAGNOSIS — G9340 Encephalopathy, unspecified: Secondary | ICD-10-CM | POA: Diagnosis not present

## 2020-06-26 DIAGNOSIS — I110 Hypertensive heart disease with heart failure: Secondary | ICD-10-CM | POA: Diagnosis present

## 2020-06-26 DIAGNOSIS — Z1611 Resistance to penicillins: Secondary | ICD-10-CM | POA: Diagnosis present

## 2020-06-26 DIAGNOSIS — Z823 Family history of stroke: Secondary | ICD-10-CM | POA: Diagnosis not present

## 2020-06-26 DIAGNOSIS — A4151 Sepsis due to Escherichia coli [E. coli]: Secondary | ICD-10-CM | POA: Diagnosis present

## 2020-06-26 DIAGNOSIS — G9341 Metabolic encephalopathy: Secondary | ICD-10-CM | POA: Diagnosis present

## 2020-06-26 DIAGNOSIS — A419 Sepsis, unspecified organism: Secondary | ICD-10-CM | POA: Diagnosis present

## 2020-06-26 DIAGNOSIS — Z66 Do not resuscitate: Secondary | ICD-10-CM | POA: Diagnosis present

## 2020-06-26 DIAGNOSIS — K219 Gastro-esophageal reflux disease without esophagitis: Secondary | ICD-10-CM | POA: Diagnosis present

## 2020-06-26 DIAGNOSIS — Z853 Personal history of malignant neoplasm of breast: Secondary | ICD-10-CM | POA: Diagnosis not present

## 2020-06-26 LAB — BASIC METABOLIC PANEL
Anion gap: 11 (ref 5–15)
BUN: 34 mg/dL — ABNORMAL HIGH (ref 8–23)
CO2: 28 mmol/L (ref 22–32)
Calcium: 9.4 mg/dL (ref 8.9–10.3)
Chloride: 101 mmol/L (ref 98–111)
Creatinine, Ser: 1.2 mg/dL — ABNORMAL HIGH (ref 0.44–1.00)
GFR, Estimated: 44 mL/min — ABNORMAL LOW (ref >60)
Glucose, Bld: 88 mg/dL (ref 70–99)
Potassium: 5 mmol/L (ref 3.5–5.1)
Sodium: 140 mmol/L (ref 135–145)

## 2020-06-26 LAB — C-REACTIVE PROTEIN: CRP: 5.1 mg/dL — ABNORMAL HIGH (ref <1.0)

## 2020-06-26 LAB — BLOOD CULTURE ID PANEL (REFLEXED) - BCID2

## 2020-06-26 LAB — CBC
HCT: 42 % (ref 36.0–46.0)
Hemoglobin: 13.7 g/dL (ref 12.0–15.0)
MCH: 32.7 pg (ref 26.0–34.0)
MCHC: 32.6 g/dL (ref 30.0–36.0)
MCV: 100.2 fL — ABNORMAL HIGH (ref 80.0–100.0)
Platelets: 160 10*3/uL (ref 150–400)
RBC: 4.19 MIL/uL (ref 3.87–5.11)
RDW: 14.9 % (ref 11.5–15.5)
WBC: 8.3 10*3/uL (ref 4.0–10.5)
nRBC: 0 % (ref 0.0–0.2)

## 2020-06-26 LAB — TSH: TSH: 1.996 u[IU]/mL (ref 0.350–4.500)

## 2020-06-26 LAB — GLUCOSE, CAPILLARY
Glucose-Capillary: 101 mg/dL — ABNORMAL HIGH (ref 70–99)
Glucose-Capillary: 73 mg/dL (ref 70–99)

## 2020-06-26 LAB — MRSA PCR SCREENING: MRSA by PCR: POSITIVE — AB

## 2020-06-26 MED ORDER — METOPROLOL TARTRATE 5 MG/5ML IV SOLN
5.0000 mg | Freq: Four times a day (QID) | INTRAVENOUS | Status: DC
  Administered 2020-06-26 – 2020-06-27 (×3): 5 mg via INTRAVENOUS
  Filled 2020-06-26 (×3): qty 5

## 2020-06-26 MED ORDER — LORAZEPAM 2 MG/ML IJ SOLN
1.0000 mg | INTRAMUSCULAR | Status: AC
  Administered 2020-06-26: 1 mg via INTRAVENOUS
  Filled 2020-06-26: qty 1

## 2020-06-26 MED ORDER — VANCOMYCIN HCL 1250 MG/250ML IV SOLN
1250.0000 mg | Freq: Once | INTRAVENOUS | Status: AC
  Administered 2020-06-26: 1250 mg via INTRAVENOUS
  Filled 2020-06-26: qty 250

## 2020-06-26 MED ORDER — VANCOMYCIN HCL 750 MG/150ML IV SOLN
750.0000 mg | INTRAVENOUS | Status: DC
  Filled 2020-06-26: qty 150

## 2020-06-26 MED ORDER — MUPIROCIN 2 % EX OINT
1.0000 "application " | TOPICAL_OINTMENT | Freq: Two times a day (BID) | CUTANEOUS | Status: AC
  Administered 2020-06-26 – 2020-06-30 (×10): 1 via NASAL
  Filled 2020-06-26 (×2): qty 22

## 2020-06-26 MED ORDER — METOPROLOL SUCCINATE ER 50 MG PO TB24: 50.0000 mg | ORAL_TABLET | Freq: Two times a day (BID) | ORAL | Status: DC

## 2020-06-26 MED ORDER — CHLORHEXIDINE GLUCONATE CLOTH 2 % EX PADS
6.0000 | MEDICATED_PAD | Freq: Every day | CUTANEOUS | Status: AC
  Administered 2020-06-26 – 2020-06-30 (×5): 6 via TOPICAL

## 2020-06-26 MED ORDER — SODIUM CHLORIDE 0.9 % IV SOLN: INTRAVENOUS | Status: DC

## 2020-06-26 MED ORDER — CARBIDOPA-LEVODOPA 25-100 MG PO TABS
1.0000 | ORAL_TABLET | Freq: Every day | ORAL | Status: DC
  Administered 2020-06-27 – 2020-07-02 (×4): 1 via ORAL
  Filled 2020-06-26 (×6): qty 1

## 2020-06-26 NOTE — Evaluation (Signed)
SLP Cancellation Note  Patient Details Name: Suprena Travaglini MRN: [id number] DOB: 10/20/1929   Cancelled treatment:       Reason Eval/Treat Not Completed: Other (comment);Fatigue/lethargy limiting ability to participate (pt currently asleep, RN reports the pt just got to floor last night, requested she message SLP when pt awakens for evaluation)  Rolena Infante, MS [facility] SLP Acute Rehab Services Office [phone] Pager [phone]   Chales Abrahams 06/26/2020, 8:51 AM

## 2020-06-26 NOTE — Progress Notes (Signed)
PHARMACY - PHYSICIAN COMMUNICATION CRITICAL VALUE ALERT - BLOOD CULTURE IDENTIFICATION (BCID)  Betty Chan is an 87 y.o. female who presented to [facility] on 06/25/2020 with a chief complaint of AMS  Assessment: Pt currently on antibiotics for empiric treatment of UTI.  BCID + 1/3 GPC; staphlyococcus species  Name of physician (or Provider) Contacted: Dr. Mahala Menghini  Current antibiotics: Ceftriaxone 1 g IV daily  Changes to prescribed antibiotics: Initiate vancomycin  Results for orders placed or performed during the hospital encounter of 06/25/20  Blood Culture ID Panel (Reflexed) (Collected: 06/25/2020 12:55 PM)  Result Value Ref Range   Enterococcus faecalis NOT DETECTED NOT DETECTED   Enterococcus Faecium NOT DETECTED NOT DETECTED   Listeria monocytogenes NOT DETECTED NOT DETECTED   Staphylococcus species DETECTED (A) NOT DETECTED   Staphylococcus aureus (BCID) NOT DETECTED NOT DETECTED   Staphylococcus epidermidis NOT DETECTED NOT DETECTED   Staphylococcus lugdunensis NOT DETECTED NOT DETECTED   Streptococcus species NOT DETECTED NOT DETECTED   Streptococcus agalactiae NOT DETECTED NOT DETECTED   Streptococcus pneumoniae NOT DETECTED NOT DETECTED   Streptococcus pyogenes NOT DETECTED NOT DETECTED   A.calcoaceticus-baumannii NOT DETECTED NOT DETECTED   Bacteroides fragilis NOT DETECTED NOT DETECTED   Enterobacterales NOT DETECTED NOT DETECTED   Enterobacter cloacae complex NOT DETECTED NOT DETECTED   Escherichia coli NOT DETECTED NOT DETECTED   Klebsiella aerogenes NOT DETECTED NOT DETECTED   Klebsiella oxytoca NOT DETECTED NOT DETECTED   Klebsiella pneumoniae NOT DETECTED NOT DETECTED   Proteus species NOT DETECTED NOT DETECTED   Salmonella species NOT DETECTED NOT DETECTED   Serratia marcescens NOT DETECTED NOT DETECTED   Haemophilus influenzae NOT DETECTED NOT DETECTED   Neisseria meningitidis NOT DETECTED NOT DETECTED   Pseudomonas aeruginosa NOT DETECTED NOT DETECTED    Stenotrophomonas maltophilia NOT DETECTED NOT DETECTED   Candida albicans NOT DETECTED NOT DETECTED   Candida auris NOT DETECTED NOT DETECTED   Candida glabrata NOT DETECTED NOT DETECTED   Candida krusei NOT DETECTED NOT DETECTED   Candida parapsilosis NOT DETECTED NOT DETECTED   Candida tropicalis NOT DETECTED NOT DETECTED   Cryptococcus neoformans/gattii NOT DETECTED NOT DETECTED    Cindi Carbon, PharmD 06/26/2020  11:36 AM

## 2020-06-26 NOTE — Progress Notes (Signed)
Second failed attempt at MRI today. Patient was medicated prior to exam; however, yet again she was still unable to tolerate the procedure. MD informed via secure chat.

## 2020-06-26 NOTE — Progress Notes (Signed)
PROGRESS NOTE   Skyley Grandmaison  HER:[id number] DOB: October 20, 1929 DOA: 06/25/2020 PCP: Maurice Small, MD  Brief Narrative:   87 year old white female from [place] Severe aortic stenosis status post TAVR 2015, last echo EF 25 to 30% 01/18/2020 on chronic oxygen 2 L Breast cancer status post lumpectomy  progressive supranuclear palsy HTN HLD hypothyroid reflux Parkinson's Lower extremity wound with wound VAC--last looked at by daughter ~ 1 month to 1.5 months prior--had no sores prior  Feels like maybe sore was not fitting right Wound got worse at last visit and was put on a wound Vac At baseline she is not confused prior to thanksgiving.  Patient was not following commands at facility where she is normally talkative--she usually is not confused, uses a wheelchair doesn't have dementia--had gone home to daughter Thanksgiving-had some delirium and was confused and  felt she had a UTI--she got some better and got confused again--rx again for UTI and recovered but did not reach her baseline patient delirious 06/23/2020-she was unresponsive and came to hospital  Work-up revealed negative ammonia normal TSH negative Covid MRI brain   [-] after review with neurology UA concerning for UTI and patient was admitted  Assessment & Plan:   Principal Problem:   Acute encephalopathy Active Problems:   Hypothyroid   Hyperlipidemia   History of breast cancer   Essential hypertension   H/O aortic valve replacement TAVR   DNR (do not resuscitate)   1. Metabolic encephalopathy  a. Seems to be improving likely sepsis plays a role superimposed on her underlying mild dementia b. She is able to tell me she does not want to eat and thinks that the year is 2021 2. sepsis from UTI versus infected left heel ulcer a. Obtain ESR CRP-x-ray of heel showed only lucency with no bony destruction or erosion--Foul smell emanating from the same b. After discussion with family will order MRI to rule  out osteomyelitis-moderate risk surgical candidate if we do find c. 1/3 cultures growing Staphylococcus therefore changed to vancomycin monotherapy for now 3. Acute superimposed on chronic hypoxic respiratory failure a. Need to desat screen although is on oxygen 2 L 4. AKI on admission with mild hyperkalemia a. Labs improving b. Start NS 50 cc/H 5. Severe aortic stenosis TAVR last EF 25 to 30% 6. HTN a. At this time resume metoprolol 50 XL daily b. Would hold lisinopril 5, Lasix 20 7. Progressive supranuclear palsy/Parkinson's plus a. Resume Sinemet 1 tablet at bedtime 8. HLD 9. Hypothyroid a. Resume levothyroxine 50 TSH this admission is normal 10. Breast cancer status post lumpectomy  DVT prophylaxis: Lovenox Code Status: DNR Family Communication: discussed in full detail with daughter Disposition:  Status is: Observation  The patient will require care spanning > 2 midnights and should be moved to inpatient because: Hemodynamically unstable and IV treatments appropriate due to intensity of illness or inability to take PO  Dispo: The patient is from: SNF              Anticipated d/c is to: SNF              Anticipated d/c date is: 2 days              Patient currently is not medically stable to d/c.   Consultants:   Neurologist as above  Procedures: None  Antimicrobials: Ceftriaxone   Subjective: Some coherency-able to tell me year tells me he is not hungry  Objective: Vitals:   06/25/20 2142 06/25/20  2219 06/26/20 0256 06/26/20 0600  BP:  (!) 156/91 132/84 (!) 143/66  Pulse:  75 97 62  Resp:  15 14 20   Temp: 97.7 F (36.5 C) 98.1 F (36.7 C) (!) 97.5 F (36.4 C) (!) 97.4 F (36.3 C)  TempSrc: Oral Oral Oral Oral  SpO2:  100% 100% 100%    Intake/Output Summary (Last 24 hours) at 06/26/2020 0744 Last data filed at 06/26/2020 0542 Gross per 24 hour  Intake 100 ml  Output 200 ml  Net -100 ml   There were no vitals filed for this visit.  Examination: EOMI  NCAT no focal deficit Chest clear no added sound S1-S2 no murmur seems to be in sinus rhythm Abdomen soft no rebound no guarding not tested as she is somewhat sleepy but will respond to voice   Right heel   Left heel       Data Reviewed: I have personally reviewed following labs and imaging studies Potassium 4.3-->5.0 BUN/creatinine 44/1.4-->34/1.2 Lactic acid 2.2--less than 1.3 Hemoglobin 13.7 white count 8.3   Radiology Studies: CT Head Wo Contrast  Result Date: 06/25/2020 CLINICAL DATA:  Persistently worsening mental status changes. EXAM: CT HEAD WITHOUT CONTRAST TECHNIQUE: Contiguous axial images were obtained from the base of the skull through the vertex without intravenous contrast. COMPARISON:  Report from study 11/24/2014 FINDINGS: Brain: Age related volume loss. Chronic small-vessel ischemic changes of the cerebral hemispheric white matter. No visible acute infarction, mass lesion, hemorrhage, hydrocephalus or extra-axial collection. Vascular: There is atherosclerotic calcification of the major vessels at the base of the brain. Skull: Negative Sinuses/Orbits: Clear/normal Other: None IMPRESSION: No acute finding by CT. Age related volume loss. Chronic small-vessel ischemic changes of the cerebral hemispheric white matter. Electronically Signed   By: Nelson Chimes M.D.   On: 06/25/2020 13:43   MR BRAIN WO CONTRAST  Result Date: 06/25/2020 CLINICAL DATA:  Mental status change EXAM: MRI HEAD WITHOUT CONTRAST TECHNIQUE: Multiplanar, multiecho pulse sequences of the brain and surrounding structures were obtained without intravenous contrast. COMPARISON:  Head CT June 25, 2020 FINDINGS: Brain: No acute infarction, hemorrhage, hydrocephalus, extra-axial collection or mass lesion. Foci of of increased signal on the diffusion weighted images in the pontine tegmentum without corresponding low signal on ADC may be artifactual. Remote lacunar infarcts in the bilateral cerebellar hemisphere  and bilateral caudate head. Scattered and confluent foci of T2 hyperintensity are seen within the white matter of the cerebral hemispheres and within the pons, nonspecific, most likely related to chronic microangiopathic changes. Mild parenchymal volume loss. Vascular: Normal flow voids. Skull and upper cervical spine: Normal marrow signal. Sinuses/Orbits: Mucosal thickening of the ethmoid cells and left maxillary sinus. Bilateral lens surgery. IMPRESSION: 1. No acute intracranial abnormality. 2. Foci of increased signal on the diffusion weighted images in the pontine tegmentum without corresponding low signal on ADC may be artifactual. 3. Remote lacunar infarcts in the bilateral cerebellar hemisphere and bilateral caudate head. 4. Moderate chronic microangiopathic changes and mild parenchymal volume loss. Electronically Signed   By: Pedro Earls M.D.   On: 06/25/2020 18:26   DG Chest Portable 1 View  Result Date: 06/25/2020 CLINICAL DATA:  Altered mental status, oxygen requirement, cough EXAM: PORTABLE CHEST 1 VIEW COMPARISON:  01/26/2020 FINDINGS: Similar low lung volumes and basilar opacities favored to be atelectasis. Stable cardiomegaly and previous TAVR. Remote right axillary lymph node dissection noted. No enlarging effusion or pneumothorax. Trachea midline. Aorta atherosclerotic. Exam is rotated to the left. IMPRESSION: Stable low volume  exam with basilar atelectasis. Cardiomegaly without CHF Aortic Atherosclerosis (ICD10-I70.0). Electronically Signed   By: Jerilynn Mages.  Shick M.D.   On: 06/25/2020 12:48   DG Foot Complete Right  Result Date: 06/25/2020 CLINICAL DATA:  Soft tissue wound right calcaneal region EXAM: RIGHT FOOT COMPLETE - 3+ VIEW COMPARISON:  March 30, 2018 FINDINGS: Frontal, oblique, and lateral views were obtained. Bones are diffusely osteoporotic. There is evidence of an old healed fracture of the proximal aspect of the fifth proximal phalanx. No acute fracture or dislocation  evident. There are hammertoe deformities involving the second, third, fourth, and fifth digits with flexion of the second, third, fourth, and fifth PIP and DIP joints and extension of the second, third, fourth, and fifth MTP joints. There is moderately severe joint space narrowing involving the first MTP joint. No significant narrowing elsewhere. There is an inferior calcaneal spur. Soft tissue lucency is seen posterior to the inferior calcaneus. No erosive change or bony destruction. No radiopaque foreign body. IMPRESSION: Lucency along the posteroinferior hindfoot. No radiopaque foreign body. No bony destruction or erosion. Underlying osteoporosis. Old healed fracture first proximal phalanx with remodeling. No acute fracture or dislocation. Hammertoe deformities at multiple sites. Severe narrowing first MTP joint. Inferior calcaneal spur noted. Electronically Signed   By: Lowella Grip III M.D.   On: 06/25/2020 12:49     Scheduled Meds: . carbidopa-levodopa  1 tablet Oral QHS  . Chlorhexidine Gluconate Cloth  6 each Topical Q0600  . enoxaparin (LOVENOX) injection  40 mg Subcutaneous Q24H  . metoprolol succinate  50 mg Oral BID  . mupirocin ointment  1 application Nasal BID   Continuous Infusions: . sodium chloride 50 mL/hr at 06/26/20 0846  . cefTRIAXone (ROCEPHIN)  IV    . vancomycin       LOS: 0 days    Time spent: 76  Nita Sells, MD [facility] To contact the attending provider between 7A-7P or the covering provider during after hours 7P-7A, please log into the web site [email or web address] and access using universal [place] password for that web site. If you do not have the password, please call the hospital operator.  06/26/2020, 7:44 AM

## 2020-06-26 NOTE — Evaluation (Signed)
Clinical/Bedside Swallow Evaluation Patient Details  Name: Juelz Claar MRN: [id number] Date of Birth: [birth year]  Today's Date: 06/26/2020 Time: SLP Start Time (ACUTE ONLY): 1553 SLP Stop Time (ACUTE ONLY): 1640 SLP Time Calculation (min) (ACUTE ONLY): 47 min  Past Medical History:  Past Medical History:  Diagnosis Date  . Aortic stenosis    s/p TAVR 08/2013 -follows at [facility] cards Regino Schultze)  . Basal cell carcinoma   . Cancer of right breast [facility]) 2004   lumpectomy  . Dyslipidemia   . GERD (gastroesophageal reflux disease)   . Heart murmur   . Hypertension   . Hypothyroid   . OA (osteoarthritis) of knee    "right" (11/24/2014)  . Osteopenia 02/13/2014   DEXA 01/2014: -2.2  . PHN (postherpetic neuralgia)    L anterior thigh 2007, recurrent 04/2013  . Seizure (HCC) 2007   single event, off Keppra since 11/2013; "not sure if it was a TIA or seizure"  . Squamous cell cancer of skin of forearm   . TIA (transient ischemic attack) 2007   "not sure if it was a TIA or seizure"   Past Surgical History:  Past Surgical History:  Procedure Laterality Date  . BASAL CELL CARCINOMA EXCISION     sternum  . BREAST BIOPSY Right 2004  . BREAST LUMPECTOMY Right 08/2002 X 2  . CARDIAC CATHETERIZATION  "several"  . CARDIAC VALVE REPLACEMENT    . CATARACT EXTRACTION W/ INTRAOCULAR LENS  IMPLANT, BILATERAL Bilateral 03/2014-07/2014  . SQUAMOUS CELL CARCINOMA EXCISION Left 06/2014   'forearm"  . TONSILLECTOMY    . TRANSCATHETER AORTIC VALVE REPLACEMENT, TRANSFEMORAL  09/06/13   [facility]   HPI:  Idella Lamontagne is a 87 y.o. female with known history of progressive supranuclear palsy ? Parkinsons plus, status post TAVR and chronic systolic heart failure, hypothyroidism dementia was brought to the ER after patient was found to be having change in mental status since morning.  Pt with heel wound with concern for osteomyelitis.  CXR negative, mri motion degraded , RA, hypotensive suspect UTI, from SNF, RMST  started last time- 01/30/2020 SLP services, dys3/thin known risk 01-19-21 ,mbs penetration of nectar, asp of thin *trace* did not clear, decr epiglottic deflection and airway closure.  Swallow evaluation ordered.    Per MD note, pt with some delirium but able to state she does not want to eat.   Assessment / Plan / Recommendation Clinical Impression  Pt currently is too lethargic and weak to consume po intake safely.  Her voice is very weak - near whisper and she benefits from verbal cues to maintain eye opening/participation.  After oral care, she was provided with single ice chip = that she masticated and swallowed. No indication of aspiration noted with single ice chip.   Due to mentation, recommend NPO except single ice chips and single tsp of thin water when fully alert.  Daughter Clydie Braun reports pt's head has been turned to the left since prior admit stating "You won't get her head straight today".  Of note, pt is known to this SLP from prior admission = when she underwent MBS Study showing silent trace aspiration of thin.  Chin tuck worsened swallow and decreased airway protection but HOB to 45* prevented aspiration of retained secretions/barium.  She was placed on a dys3/thin diet with accepted aspiration risk and mitigation strategies.  Daughter present reports pt was tolerating po well without aspiration until the last few days when she became sick, at which time she was only drinking  and not consuming solids.   Pt and daughter report goal is for pt to get better to be able to consume po intake.  Will follow.  SLP floated pt right heel for comfort and due to wound per daughter request.  She also desired repositioning for pt - informed RN. SLP Visit Diagnosis: Dysphagia, oropharyngeal phase (R13.12)    Aspiration Risk  Moderate aspiration risk    Diet Recommendation NPO;NPO except meds   Liquid Administration via: Spoon Medication Administration: Via alternative means Supervision: Full  supervision/cueing for compensatory strategies Postural Changes: Seated upright at 90 degrees;Remain upright for at least 30 minutes after po intake    Other  Recommendations Oral Care Recommendations: Oral care QID   Follow up Recommendations        Frequency and Duration min 1 x/week  2 weeks       Prognosis Prognosis for Safe Diet Advancement: Fair Barriers to Reach Goals: Time post onset;Severity of deficits      Swallow Study   General Date of Onset: 06/26/20 HPI: Jademarie Borton is a 87 y.o. female with known history of progressive supranuclear palsy ? Parkinsons plus, status post TAVR and chronic systolic heart failure, hypothyroidism dementia was brought to the ER after patient was found to be having change in mental status since morning.  Pt with heel wound with concern for osteomyelitis.  CXR negative, mri motion degraded , RA, hypotensive suspect UTI, from SNF, RMST started last time- 01/30/2020 SLP services, dys3/thin known risk 01-19-21 ,mbs penetration of nectar, asp of thin *trace* did not clear, decr epiglottic deflection and airway closure.  Swallow evaluation ordered.    Per MD note, pt with some delirium but able to state she does not want to eat. Type of Study: Bedside Swallow Evaluation Previous Swallow Assessment: prior MBS, penetration of thin, trace aspiration of nectar, dys3/thin diet Diet Prior to this Study: NPO Temperature Spikes Noted: No Respiratory Status: Nasal cannula History of Recent Intubation: No Behavior/Cognition: Alert;Cooperative;Pleasant mood Oral Cavity Assessment: Dry Oral Care Completed by SLP: No Oral Cavity - Dentition: Adequate natural dentition Vision: Impaired for self-feeding Self-Feeding Abilities: Total assist Patient Positioning: Upright in bed Baseline Vocal Quality: Suspected CN X (Vagus) involvement;Low vocal intensity (pt nearly whispers) Volitional Cough: Weak Volitional Swallow: Unable to elicit    Oral/Motor/Sensory  Function Overall Oral Motor/Sensory Function: Other (comment) (pt did not follow directions for OME, fatigued, able to seal lips on spoon)   Ice Chips Ice chips: Impaired Presentation: Spoon (single small ice chip) Pharyngeal Phase Impairments: Suspected delayed Swallow;Multiple swallows Other Comments: no indication of aspiration, voice is dysphonic, nearly whisper only and pt nodded off immediately after ice chip intake   Thin Liquid Thin Liquid: Impaired Other Comments: moisture from toothette with oral care - delayed swallow - no indication of aspiration but pt with aspiration of thin after swallow from pharyngeal retention spilling into open airway    Nectar Thick Nectar Thick Liquid: Not tested   Honey Thick Honey Thick Liquid: Not tested   Puree Puree: Not tested   Solid     Solid: Not tested      Macario Golds 06/26/2020,5:03 PM   Kathleen Lime, MS [place] Office [phone] Pager [phone]

## 2020-06-26 NOTE — Progress Notes (Signed)
Failed attempt at MRI. A VERY motion degraded MRI of the right foot without gadolinium completed.

## 2020-06-26 NOTE — Progress Notes (Signed)
Pharmacy Antibiotic Note  Betty Chan is a 87 y.o. female admitted on 06/25/2020 with AMS.  Pharmacy has been consulted for vancomycin dosing.  Pt initially started on ceftriaxone for empiric UTI treatment. BCID now + 1/3 staph species. Pharmacy consulted to dose vancomycin.  Today, 06/26/20  WBC WNL  Lactate 2.2 > 1.3  SCr 1.2, CrCl 35 mL/min  Afebrile  Plan:  Vancomycin 1250 mg IV LD followed by 750 mg IV q24h  Goal vancomycin AUC 400-550  Follow culture data and renal function  Height: 5\' 7"  (170.2 cm) Weight: 77.1 kg (169 lb 15.6 oz) IBW/kg (Calculated) : 61.6  Temp (24hrs), Avg:97.8 F (36.6 C), Min:97.4 F (36.3 C), Max:98.1 F (36.7 C)  Recent Labs  Lab 06/25/20 1255 06/25/20 1630 06/26/20 0529  WBC 6.6  --  8.3  CREATININE 1.42*  --  1.20*  LATICACIDVEN 2.2* 1.3  --     Estimated Creatinine Clearance: 34.7 mL/min (A) (by C-G formula based on SCr of 1.2 mg/dL (H)).    Allergies  Allergen Reactions  . Amoxicillin Rash  . Penicillins Rash    Antimicrobials this admission: ceftriaxone 1/3 >>  vancomycin 1/4 >>   Dose adjustments this admission:  Microbiology results: 1/3 BCx: 1/3 GPC, staph species 1/3 UCx:    Thank you for allowing pharmacy to be a part of this patient's care.  08/24/20, PharmD 06/26/2020 11:40 AM

## 2020-06-27 DIAGNOSIS — E785 Hyperlipidemia, unspecified: Secondary | ICD-10-CM

## 2020-06-27 DIAGNOSIS — E039 Hypothyroidism, unspecified: Secondary | ICD-10-CM

## 2020-06-27 LAB — CBC WITH DIFFERENTIAL/PLATELET
Abs Immature Granulocytes: 0.04 10*3/uL (ref 0.00–0.07)
Basophils Absolute: 0 10*3/uL (ref 0.0–0.1)
Basophils Relative: 0 %
Eosinophils Absolute: 0.1 10*3/uL (ref 0.0–0.5)
Eosinophils Relative: 1 %
HCT: 39.2 % (ref 36.0–46.0)
Hemoglobin: 12.8 g/dL (ref 12.0–15.0)
Immature Granulocytes: 0 %
Lymphocytes Relative: 10 %
Lymphs Abs: 0.9 10*3/uL (ref 0.7–4.0)
MCH: 32.8 pg (ref 26.0–34.0)
MCHC: 32.7 g/dL (ref 30.0–36.0)
MCV: 100.5 fL — ABNORMAL HIGH (ref 80.0–100.0)
Monocytes Absolute: 0.8 10*3/uL (ref 0.1–1.0)
Monocytes Relative: 9 %
Neutro Abs: 7.2 10*3/uL (ref 1.7–7.7)
Neutrophils Relative %: 80 %
Platelets: 185 10*3/uL (ref 150–400)
RBC: 3.9 MIL/uL (ref 3.87–5.11)
RDW: 14.5 % (ref 11.5–15.5)
WBC: 9 10*3/uL (ref 4.0–10.5)
nRBC: 0 % (ref 0.0–0.2)

## 2020-06-27 LAB — COMPREHENSIVE METABOLIC PANEL
ALT: 35 U/L (ref 0–44)
AST: 30 U/L (ref 15–41)
Albumin: 3 g/dL — ABNORMAL LOW (ref 3.5–5.0)
Alkaline Phosphatase: 77 U/L (ref 38–126)
Anion gap: 11 (ref 5–15)
BUN: 34 mg/dL — ABNORMAL HIGH (ref 8–23)
CO2: 24 mmol/L (ref 22–32)
Calcium: 9 mg/dL (ref 8.9–10.3)
Chloride: 105 mmol/L (ref 98–111)
Creatinine, Ser: 1.14 mg/dL — ABNORMAL HIGH (ref 0.44–1.00)
GFR, Estimated: 46 mL/min — ABNORMAL LOW (ref >60)
Glucose, Bld: 112 mg/dL — ABNORMAL HIGH (ref 70–99)
Potassium: 4.3 mmol/L (ref 3.5–5.1)
Sodium: 140 mmol/L (ref 135–145)
Total Bilirubin: 1.1 mg/dL (ref 0.3–1.2)
Total Protein: 7 g/dL (ref 6.5–8.1)

## 2020-06-27 LAB — GLUCOSE, CAPILLARY
Glucose-Capillary: 114 mg/dL — ABNORMAL HIGH (ref 70–99)
Glucose-Capillary: 179 mg/dL — ABNORMAL HIGH (ref 70–99)
Glucose-Capillary: 66 mg/dL — ABNORMAL LOW (ref 70–99)
Glucose-Capillary: 75 mg/dL (ref 70–99)
Glucose-Capillary: 96 mg/dL (ref 70–99)

## 2020-06-27 LAB — CULTURE, BLOOD (ROUTINE X 2)

## 2020-06-27 LAB — URINE CULTURE: Culture: >=100000 — AB

## 2020-06-27 MED ORDER — METOPROLOL TARTRATE 25 MG PO TABS
25.0000 mg | ORAL_TABLET | Freq: Two times a day (BID) | ORAL | Status: DC
  Administered 2020-06-27 – 2020-06-28 (×3): 25 mg via ORAL
  Filled 2020-06-27 (×3): qty 1

## 2020-06-27 MED ORDER — ATORVASTATIN CALCIUM 20 MG PO TABS
20.0000 mg | ORAL_TABLET | Freq: Every day | ORAL | Status: DC
  Administered 2020-06-27 – 2020-07-03 (×6): 20 mg via ORAL
  Filled 2020-06-27 (×5): qty 1

## 2020-06-27 MED ORDER — LEVOTHYROXINE SODIUM 50 MCG PO TABS
50.0000 ug | ORAL_TABLET | Freq: Every day | ORAL | Status: DC
  Administered 2020-06-28 – 2020-07-03 (×4): 50 ug via ORAL
  Filled 2020-06-27 (×5): qty 1

## 2020-06-27 MED ORDER — DEXTROSE 50 % IV SOLN
12.5000 g | INTRAVENOUS | Status: AC
  Administered 2020-06-27: 12.5 g via INTRAVENOUS
  Filled 2020-06-27: qty 50

## 2020-06-27 NOTE — Progress Notes (Signed)
Patient with hypoglycemic event. CBG of 66. Patient alert. Dextose 50% 12.5 G IV given at 0022. Recheck CBG 96. Will continue to monitor pt.

## 2020-06-27 NOTE — Progress Notes (Signed)
  Speech Language Pathology Treatment: Dysphagia  Patient Details Name: Betty Chan MRN: [id number] DOB: [birth year] Today's Date: 06/27/2020 Time: 1120-1200 SLP Time Calculation (min) (ACUTE ONLY): 40 min  Assessment / Plan / Recommendation Clinical Impression  Pt was seen at bedside for skilled ST intervention targeting goal for PO readiness. RN contacted SLP this morning to inform of pt's improvement in mentation. Upon arrival, pt was seated upright in bed, awake and alert. No family present.   Suction was set up at bedside to facilitate thorough oral care. Pt required max assist with oral care. Pt was able to produce a weak throat clear on command, but was unable to demonstrate volitional cough. Voice quality continues to be low and weak, but there is some voicing present today. Pt accepted trials of nectar thick liquid via teaspoon, cup, and straw, and puree. She declined trial of solid texture. Thin liquids were not attempted at this time, due to history of aspiration of thin liquids in July 2021. Pt appears to tolerate puree and nectar thick liquids without overt s/s aspiration. She is impulsive with straw drinking, with audible swallow noted. Repeated cues needed to decrease bolus size.   At this time, recommend dys 1 (puree) diet with nectar thick liquids, crushed meds. PO intake only when pt is awake, alert, and upright, with 1:1 assistance given with all PO. Pt may continue to have small individual ice chips via spoon immediately after oral care. Safe swallow precautions posted at [facility] and discussed with RN. MD also notified. SLP will continue to follow to assess diet tolerance and determine readiness/need for advancement/instrumental study.   HPI HPI: Betty Chan is a 87 y.o. female with known history of progressive supranuclear palsy ? Parkinsons plus, status post TAVR and chronic systolic heart failure, hypothyroidism dementia was brought to the ER after patient was found to be  having change in mental status since morning.  Pt with heel wound with concern for osteomyelitis.  CXR negative, mri motion degraded , RA, hypotensive suspect UTI, from SNF, RMST started last time- 01/30/2020 SLP services, dys3/thin known risk 01-20-20 ,mbs penetration of nectar, asp of thin *trace* did not clear, decr epiglottic deflection and airway closure.  Swallow evaluation ordered.    Per MD note, pt with some delirium but able to state she does not want to eat.      SLP Plan  Continue with current plan of care       Recommendations  Diet recommendations: Dysphagia 1 (puree);Nectar-thick liquid Liquids provided via: Cup;Straw Medication Administration: Crushed with puree Supervision: Full supervision/cueing for compensatory strategies;Trained caregiver to feed patient Compensations: Minimize environmental distractions;Slow rate;Small sips/bites Postural Changes and/or Swallow Maneuvers: Seated upright 90 degrees;Upright 30-60 min after meal                Oral Care Recommendations: Oral care BID Follow up Recommendations: Skilled Nursing facility;24 hour supervision/assistance SLP Visit Diagnosis: Dysphagia, oropharyngeal phase (R13.12) Plan: Continue with current plan of care       GO              Betty Chan, [facility], CCC-SLP Speech Language Pathologist Office: [phone] Pager: [phone]  Betty Chan 06/27/2020, 12:10 PM

## 2020-06-27 NOTE — Progress Notes (Addendum)
PROGRESS NOTE    Betty Chan  [id number] DOB: Jun 10, 1930 DOA: 06/25/2020 PCP: Maurice Small, MD   Brief Narrative: Betty Chan is a 87 y.o. female with a history of severe headache stenosis status post TAVR, chronic systolic heart failure, chronic hypoxia, history of breast cancer status post lumpectomy, progressive supranuclear palsy, hypertension, hyperlipidemia, hypothyroidism.  Patient presented secondary to worsening mental status with evidence of sepsis and found to have a urinary tract infection.  She has been improving with IV antibiotics.   Assessment & Plan:   Principal Problem:   Acute encephalopathy Active Problems:   Hypothyroid   Hyperlipidemia   History of breast cancer   Essential hypertension   H/O aortic valve replacement TAVR   DNR (do not resuscitate)   Sepsis ([place])   Acute metabolic encephalopathy Appears to be secondary to urinary tract infection.  Patient is significant improved from admission.  Appears to be resolved.  Sepsis Present on admission.  Secondary to UTI versus infected heel ulcer.  No osteomyelitis noted on MRI.  Urine culture significant for E. Coli. She is being treated with Ceftriaxone  UTI E. coli.  Resistant to amoxicillin.  Patient is currently on ceftriaxone. -Continue ceftriaxone IV  Right foot ulcer Left heel.  CT negative for osteomyelitis.  Possible infection.  Will manage with antibiotics as mentioned above.  AKI Baseline creatinine appears to be around 1.1.  Creatinine of 1.42 on admission with improvement and resolution of AKI with IV fluids. -Discontinue IV fluids  Dysphagia In setting of underlying Parkinson disease in addition to metabolic encephalopathy.  Speech is reevaluated and is recommending dysphagia diet with nectar thick liquids. History of silent aspiration. -SLP recommendations  Positive blood cultures Only one set of cultures. Culture significant for staphylococcus warneri. Likely  contaminant. Started on Vancomycin IV on 1/4. -Discontinue vancomycin.  Severe aortic stenosis Patient status post TAVR.  Complicated by history of systolic heart failure.  Chronic systolic heart failure Patient appears to be euvolemic.  Patient is on lisinopril 5 mg, metoprolol tartrate 50 mg twice daily, Lasix 20 mg daily.  Blood pressure currently on softer side.  Medications held secondary to poor mental status on admission. -Resume home metoprolol at lower dose of 25 mg twice daily and discontinue metoprolol IV -Hold lisinopril and Lasix secondary to blood pressure and AKI  Progressive supranuclear palsy Parkinson disease -Continue Sinemet  Essential hypertension -metoprolol as mentioned above -Lisinopril/Lasix as mentioned above  Hypothyroidism -Resume Synthroid 50 micrograms  Breast cancer Patient is status post lumpectomy.  Pressure injury Left heel, POA   DVT prophylaxis: Lovenox Code Status:   Code Status: DNR Family Communication: None at bedside. Called daughter on telephone; no answer. Disposition Plan: Discharge back to facility likely in 24-48 hours pending ability to take oral nutrition   Consultants:   None  Procedures:   None  Antimicrobials:  Ceftriaxone  Vancomycin    Subjective: Hungry. No other concerns.  Objective: Vitals:   06/26/20 1000 06/26/20 1406 06/26/20 2030 06/27/20 0553  BP:  (!) 141/63 (!) 140/56 115/70  Pulse:  68 64 74  Resp:  14 14 16   Temp:  98.1 F (36.7 C) 97.8 F (36.6 C) 97.6 F (36.4 C)  TempSrc:  Oral Oral Oral  SpO2:  100% 92%   Weight: 77.1 kg     Height: 5\' 7"  (1.702 m)       Intake/Output Summary (Last 24 hours) at 06/27/2020 1139 Last data filed at 06/27/2020 0601 Gross per 24 hour  Intake 800 ml  Output 600 ml  Net 200 ml   Filed Weights   06/26/20 1000  Weight: 77.1 kg    Examination:  General exam: Appears calm and comfortable Respiratory system: Clear to auscultation. Respiratory  effort normal. Cardiovascular system: S1 & S2 heard, RRR. No murmurs, rubs, gallops or clicks. Gastrointestinal system: Abdomen is nondistended, soft and nontender. No organomegaly or masses felt. Normal bowel sounds heard. Central nervous system: Alert and oriented to person. No focal neurological deficits. Musculoskeletal: No calf tenderness Skin: No cyanosis. No rashes Psychiatry: Judgement and insight appear impaired.    Data Reviewed: I have personally reviewed following labs and imaging studies  CBC Lab Results  Component Value Date   WBC 9.0 06/27/2020   RBC 3.90 06/27/2020   HGB 12.8 06/27/2020   HCT 39.2 06/27/2020   MCV 100.5 (H) 06/27/2020   MCH 32.8 06/27/2020   PLT 185 06/27/2020   MCHC 32.7 06/27/2020   RDW 14.5 06/27/2020   LYMPHSABS 0.9 06/27/2020   MONOABS 0.8 06/27/2020   EOSABS 0.1 06/27/2020   BASOSABS 0.0 [id number]     Last metabolic panel Lab Results  Component Value Date   NA 140 06/27/2020   K 4.3 06/27/2020   CL 105 06/27/2020   CO2 24 06/27/2020   BUN 34 (H) 06/27/2020   CREATININE 1.14 (H) 06/27/2020   GLUCOSE 112 (H) 06/27/2020   GFRNONAA 46 (L) 06/27/2020   GFRAA 36 (L) 01/29/2020   CALCIUM 9.0 06/27/2020   PROT 7.0 06/27/2020   ALBUMIN 3.0 (L) 06/27/2020   BILITOT 1.1 06/27/2020   ALKPHOS 77 06/27/2020   AST 30 06/27/2020   ALT 35 06/27/2020   ANIONGAP 11 06/27/2020    CBG (last 3)  Recent Labs    06/27/20 0009 06/27/20 0040 06/27/20 0554  GLUCAP 66* 96 75     GFR: Estimated Creatinine Clearance: 36.5 mL/min (A) (by C-G formula based on SCr of 1.14 mg/dL (H)).  Coagulation Profile: No results for input(s): INR, PROTIME in the last 168 hours.  Recent Results (from the past 240 hour(s))  Blood culture (routine x 2)     Status: None (Preliminary result)   Collection Time: 06/25/20 12:55 PM   Specimen: BLOOD  Result Value Ref Range Status   Specimen Description   Final    BLOOD LEFT ANTECUBITAL Performed at [facility] [address], [place], [place] [address]    Special Requests   Final    BOTTLES DRAWN AEROBIC AND ANAEROBIC Blood Culture adequate volume Performed at [facility] [address] [place], [place] [address]    Culture   Final    NO GROWTH 2 DAYS Performed at [facility] [address], [place], [place] [address]    Report Status PENDING  Incomplete  Blood culture (routine x 2)     Status: Abnormal   Collection Time: 06/25/20 12:55 PM   Specimen: BLOOD LEFT WRIST  Result Value Ref Range Status   Specimen Description   Final    BLOOD LEFT WRIST Performed at [facility] [address] [place], [place] [address]    Special Requests   Final    BOTTLES DRAWN AEROBIC ONLY Blood Culture results may not be optimal due to an inadequate volume of blood received in culture bottles Performed at [facility] [address], [place],  [address]    Culture  Setup Time   Final  GRAM POSITIVE COCCI IN CLUSTERS AEROBIC BOTTLE ONLY CRITICAL RESULT CALLED TO, READ BACK BY AND VERIFIED WITH: Haze Boyden PharmD 10:00 06/26/20 ([email or web address])    Culture (A)  Final    STAPHYLOCOCCUS WARNERI THE SIGNIFICANCE OF ISOLATING THIS ORGANISM FROM A SINGLE SET OF BLOOD CULTURES WHEN MULTIPLE SETS ARE DRAWN IS UNCERTAIN. PLEASE NOTIFY THE MICROBIOLOGY DEPARTMENT WITHIN ONE WEEK IF SPECIATION AND SENSITIVITIES ARE REQUIRED. Performed at [facility], [address], [place], [place] [address]    Report Status 06/27/2020 FINAL  Final  Blood Culture ID Panel (Reflexed)     Status: Abnormal   Collection Time: 06/25/20 12:55 PM  Result Value Ref Range Status   Enterococcus faecalis NOT DETECTED NOT DETECTED Final   Enterococcus Faecium NOT DETECTED NOT DETECTED Final   Listeria monocytogenes NOT DETECTED NOT DETECTED Final   Staphylococcus species DETECTED (A) NOT DETECTED Final    Comment: CRITICAL RESULT CALLED TO, READ  BACK BY AND VERIFIED WITH: Haze Boyden PharmD 10:00 06/26/20 ([email or web address])    Staphylococcus aureus (BCID) NOT DETECTED NOT DETECTED Final   Staphylococcus epidermidis NOT DETECTED NOT DETECTED Final   Staphylococcus lugdunensis NOT DETECTED NOT DETECTED Final   Streptococcus species NOT DETECTED NOT DETECTED Final   Streptococcus agalactiae NOT DETECTED NOT DETECTED Final   Streptococcus pneumoniae NOT DETECTED NOT DETECTED Final   Streptococcus pyogenes NOT DETECTED NOT DETECTED Final   A.calcoaceticus-baumannii NOT DETECTED NOT DETECTED Final   Bacteroides fragilis NOT DETECTED NOT DETECTED Final   Enterobacterales NOT DETECTED NOT DETECTED Final   Enterobacter cloacae complex NOT DETECTED NOT DETECTED Final   Escherichia coli NOT DETECTED NOT DETECTED Final   Klebsiella aerogenes NOT DETECTED NOT DETECTED Final   Klebsiella oxytoca NOT DETECTED NOT DETECTED Final   Klebsiella pneumoniae NOT DETECTED NOT DETECTED Final   Proteus species NOT DETECTED NOT DETECTED Final   Salmonella species NOT DETECTED NOT DETECTED Final   Serratia marcescens NOT DETECTED NOT DETECTED Final   Haemophilus influenzae NOT DETECTED NOT DETECTED Final   Neisseria meningitidis NOT DETECTED NOT DETECTED Final   Pseudomonas aeruginosa NOT DETECTED NOT DETECTED Final   Stenotrophomonas maltophilia NOT DETECTED NOT DETECTED Final   Candida albicans NOT DETECTED NOT DETECTED Final   Candida auris NOT DETECTED NOT DETECTED Final   Candida glabrata NOT DETECTED NOT DETECTED Final   Candida krusei NOT DETECTED NOT DETECTED Final   Candida parapsilosis NOT DETECTED NOT DETECTED Final   Candida tropicalis NOT DETECTED NOT DETECTED Final   Cryptococcus neoformans/gattii NOT DETECTED NOT DETECTED Final    Comment: Performed at [facility], [address] [place], [place] [address]  Resp Panel by RT-PCR (Flu A&B, Covid) Nasopharyngeal Swab     Status: None   Collection Time: 06/25/20 12:59 PM   Specimen:  Nasopharyngeal Swab; Nasopharyngeal(NP) swabs in vial transport medium  Result Value Ref Range Status   SARS Coronavirus 2 by RT PCR NEGATIVE NEGATIVE Final    Comment: (NOTE) SARS-CoV-2 target nucleic acids are NOT DETECTED.  The SARS-CoV-2 RNA is generally detectable in upper respiratory specimens during the acute phase of infection. The lowest concentration of SARS-CoV-2 viral copies this assay can detect is 138 copies/mL. A negative result does not preclude SARS-Cov-2 infection and should not be used as the sole basis for treatment or other patient management decisions. A negative result may occur with  improper specimen collection/handling, submission of specimen other than nasopharyngeal swab, presence of viral mutation(s) within the areas targeted by this assay,  and inadequate number of viral copies(<138 copies/mL). A negative result must be combined with clinical observations, patient history, and epidemiological information. The expected result is Negative.  Fact Sheet for Patients:  EntrepreneurPulse.com.au  Fact Sheet for Healthcare Providers:  IncredibleEmployment.be  This test is no t yet approved or cleared by the [place] FDA and  has been authorized for detection and/or diagnosis of SARS-CoV-2 by FDA under an Emergency Use Authorization (EUA). This EUA will remain  in effect (meaning this test can be used) for the duration of the COVID-19 declaration under Section 564(b)(1) of the Act, 21 U.S.C.section 360bbb-3(b)(1), unless the authorization is terminated  or revoked sooner.       Influenza A by PCR NEGATIVE NEGATIVE Final   Influenza B by PCR NEGATIVE NEGATIVE Final    Comment: (NOTE) The Xpert Xpress SARS-CoV-2/FLU/RSV plus assay is intended as an aid in the diagnosis of influenza from Nasopharyngeal swab specimens and should not be used as a sole basis for treatment. Nasal washings and aspirates are unacceptable for  Xpert Xpress SARS-CoV-2/FLU/RSV testing.  Fact Sheet for Patients: EntrepreneurPulse.com.au  Fact Sheet for Healthcare Providers: IncredibleEmployment.be  This test is not yet approved or cleared by the [place] FDA and has been authorized for detection and/or diagnosis of SARS-CoV-2 by FDA under an Emergency Use Authorization (EUA). This EUA will remain in effect (meaning this test can be used) for the duration of the COVID-19 declaration under Section 564(b)(1) of the Act, 21 U.S.C. section 360bbb-3(b)(1), unless the authorization is terminated or revoked.  Performed at [facility], [place] [address] [place] [address], [place], [place] [address]   Urine culture     Status: Abnormal   Collection Time: 06/25/20  4:30 PM   Specimen: Urine, Random  Result Value Ref Range Status   Specimen Description   Final    URINE, RANDOM Performed at [facility] [address], [place], [place] [address]    Special Requests   Final    NONE Performed at [facility], [place] [address], [place], [place] [address]    Culture >=100,000 COLONIES/mL ESCHERICHIA COLI (A)  Final   Report Status 06/27/2020 FINAL  Final   Organism ID, Bacteria ESCHERICHIA COLI (A)  Final      Susceptibility   Escherichia coli - MIC*    AMPICILLIN 4 SENSITIVE Sensitive     CEFAZOLIN <=4 SENSITIVE Sensitive     CEFEPIME <=0.12 SENSITIVE Sensitive     CEFTRIAXONE <=0.25 SENSITIVE Sensitive     CIPROFLOXACIN <=0.25 SENSITIVE Sensitive     GENTAMICIN <=1 SENSITIVE Sensitive     IMIPENEM <=0.25 SENSITIVE Sensitive     NITROFURANTOIN <=16 SENSITIVE Sensitive     TRIMETH/SULFA <=20 SENSITIVE Sensitive     AMPICILLIN/SULBACTAM <=2 SENSITIVE Sensitive     PIP/TAZO <=4 SENSITIVE Sensitive     * >=100,000 COLONIES/mL ESCHERICHIA COLI  MRSA PCR Screening     Status: Abnormal   Collection Time: 06/26/20 12:53 AM   Specimen: Nasal Mucosa;  Nasopharyngeal  Result Value Ref Range Status   MRSA by PCR POSITIVE (A) NEGATIVE Final    Comment:        The GeneXpert MRSA Assay (FDA approved for NASAL specimens only), is one component of a comprehensive MRSA colonization surveillance program. It is not intended to diagnose MRSA infection nor to guide or monitor treatment for MRSA infections. RESULT CALLED TO, READ BACK BY AND VERIFIED WITH: RIBINSON K. 12.04.22 @ 0210 BY MECIAL J. Performed at  [facility], [place] [address] [place], [place] [address]         Radiology Studies: CT Head Wo Contrast  Result Date: 06/25/2020 CLINICAL DATA:  Persistently worsening mental status changes. EXAM: CT HEAD WITHOUT CONTRAST TECHNIQUE: Contiguous axial images were obtained from the base of the skull through the vertex without intravenous contrast. COMPARISON:  Report from study 11/24/2014 FINDINGS: Brain: Age related volume loss. Chronic small-vessel ischemic changes of the cerebral hemispheric white matter. No visible acute infarction, mass lesion, hemorrhage, hydrocephalus or extra-axial collection. Vascular: There is atherosclerotic calcification of the major vessels at the base of the brain. Skull: Negative Sinuses/Orbits: Clear/normal Other: None IMPRESSION: No acute finding by CT. Age related volume loss. Chronic small-vessel ischemic changes of the cerebral hemispheric white matter. Electronically Signed   By: Nelson Chimes M.D.   On: 06/25/2020 13:43   MR BRAIN WO CONTRAST  Result Date: 06/25/2020 CLINICAL DATA:  Mental status change EXAM: MRI HEAD WITHOUT CONTRAST TECHNIQUE: Multiplanar, multiecho pulse sequences of the brain and surrounding structures were obtained without intravenous contrast. COMPARISON:  Head CT June 25, 2020 FINDINGS: Brain: No acute infarction, hemorrhage, hydrocephalus, extra-axial collection or mass lesion. Foci of of increased signal on the diffusion weighted images in the pontine tegmentum  without corresponding low signal on ADC may be artifactual. Remote lacunar infarcts in the bilateral cerebellar hemisphere and bilateral caudate head. Scattered and confluent foci of T2 hyperintensity are seen within the white matter of the cerebral hemispheres and within the pons, nonspecific, most likely related to chronic microangiopathic changes. Mild parenchymal volume loss. Vascular: Normal flow voids. Skull and upper cervical spine: Normal marrow signal. Sinuses/Orbits: Mucosal thickening of the ethmoid cells and left maxillary sinus. Bilateral lens surgery. IMPRESSION: 1. No acute intracranial abnormality. 2. Foci of increased signal on the diffusion weighted images in the pontine tegmentum without corresponding low signal on ADC may be artifactual. 3. Remote lacunar infarcts in the bilateral cerebellar hemisphere and bilateral caudate head. 4. Moderate chronic microangiopathic changes and mild parenchymal volume loss. Electronically Signed   By: Pedro Earls M.D.   On: 06/25/2020 18:26   CT FOOT RIGHT WO CONTRAST  Result Date: 06/26/2020 CLINICAL DATA:  Left heel ulceration.  Question osteomyelitis. EXAM: CT OF THE RIGHT FOOT WITHOUT CONTRAST TECHNIQUE: Multidetector CT imaging of the right foot was performed according to the standard protocol. Multiplanar CT image reconstructions were also generated. COMPARISON:  Plain films right foot 06/25/2020 and 03/30/2018. FINDINGS: Bones/Joint/Cartilage No bony destructive change, periosteal reaction or loss of cortical bone is identified. No intraosseous gas is seen. No fracture or dislocation. No joint effusion is identified. Ligaments Suboptimally assessed by CT. Muscles and Tendons There is no intramuscular fluid collection or gas within muscle or tracking along fascial planes. No tendon tear is identified. Muscles are diffusely atrophic. Soft tissues Skin ulceration over the posteromedial calcaneus is noted. No focal fluid collection is  seen. IMPRESSION: Skin ulceration without evidence of abscess, osteomyelitis or septic joint. Electronically Signed   By: Inge Rise M.D.   On: 06/26/2020 13:21   DG Chest Portable 1 View  Result Date: 06/25/2020 CLINICAL DATA:  Altered mental status, oxygen requirement, cough EXAM: PORTABLE CHEST 1 VIEW COMPARISON:  01/26/2020 FINDINGS: Similar low lung volumes and basilar opacities favored to be atelectasis. Stable cardiomegaly and previous TAVR. Remote right axillary lymph node dissection noted. No enlarging effusion or pneumothorax. Trachea midline. Aorta atherosclerotic. Exam is rotated to the left. IMPRESSION: Stable low volume  exam with basilar atelectasis. Cardiomegaly without CHF Aortic Atherosclerosis (ICD10-I70.0). Electronically Signed   By: Jerilynn Mages.  Shick M.D.   On: 06/25/2020 12:48   DG Foot Complete Right  Result Date: 06/25/2020 CLINICAL DATA:  Soft tissue wound right calcaneal region EXAM: RIGHT FOOT COMPLETE - 3+ VIEW COMPARISON:  March 30, 2018 FINDINGS: Frontal, oblique, and lateral views were obtained. Bones are diffusely osteoporotic. There is evidence of an old healed fracture of the proximal aspect of the fifth proximal phalanx. No acute fracture or dislocation evident. There are hammertoe deformities involving the second, third, fourth, and fifth digits with flexion of the second, third, fourth, and fifth PIP and DIP joints and extension of the second, third, fourth, and fifth MTP joints. There is moderately severe joint space narrowing involving the first MTP joint. No significant narrowing elsewhere. There is an inferior calcaneal spur. Soft tissue lucency is seen posterior to the inferior calcaneus. No erosive change or bony destruction. No radiopaque foreign body. IMPRESSION: Lucency along the posteroinferior hindfoot. No radiopaque foreign body. No bony destruction or erosion. Underlying osteoporosis. Old healed fracture first proximal phalanx with remodeling. No acute fracture  or dislocation. Hammertoe deformities at multiple sites. Severe narrowing first MTP joint. Inferior calcaneal spur noted. Electronically Signed   By: Lowella Grip III M.D.   On: 06/25/2020 12:49        Scheduled Meds: . carbidopa-levodopa  1 tablet Oral QHS  . Chlorhexidine Gluconate Cloth  6 each Topical Q0600  . enoxaparin (LOVENOX) injection  40 mg Subcutaneous Q24H  . metoprolol tartrate  5 mg Intravenous Q6H  . mupirocin ointment  1 application Nasal BID   Continuous Infusions: . sodium chloride 50 mL/hr at 06/27/20 0757  . cefTRIAXone (ROCEPHIN)  IV 1 g (06/26/20 2030)     LOS: 1 day     Cordelia Poche, MD [facility] 06/27/2020, 11:39 AM  If 7PM-7AM, please contact night-coverage [email or web address]

## 2020-06-27 NOTE — TOC Initial Note (Signed)
Transition of Care [facility]) - Initial/Assessment Note    Patient Details  Name: Betty Chan MRN: [id number] Date of Birth: 01-30-1930  Transition of Care [facility]) CM/SW Contact:    Lynnell Catalan, RN Phone Number: 06/27/2020, 10:11 AM  Clinical Narrative:                 Pt is from [facility] private [facility]. Spoke with liaison from facility Ardine Eng) who confirms that pt will go back there at dc. TOC will continue to follow and assist with DC.  Expected Discharge Plan: Long Term Nursing Home Barriers to Discharge: Continued Medical Work up   Expected Discharge Plan and Services Expected Discharge Plan: [place]   Discharge Planning Services: CM Consult    Prior Living Arrangements/Services   Lives with:: Facility Resident            Care giver support system in place?: Yes (comment)      Activities of Daily Living Home Assistive Devices/Equipment: Wheelchair,Eyeglasses ADL Screening (condition at time of admission) Patient's cognitive ability adequate to safely complete daily activities?: No Is the patient deaf or have difficulty hearing?: Yes Does the patient have difficulty seeing, even when wearing glasses/contacts?: No Does the patient have difficulty concentrating, remembering, or making decisions?: Yes Patient able to express need for assistance with ADLs?: No Does the patient have difficulty dressing or bathing?: Yes Independently performs ADLs?: No Communication: Needs assistance Is this a change from baseline?: Pre-admission baseline Dressing (OT): Needs assistance Is this a change from baseline?: Pre-admission baseline Grooming: Needs assistance Is this a change from baseline?: Pre-admission baseline Feeding: Needs assistance Is this a change from baseline?: Pre-admission baseline Bathing: Needs assistance Is this a change from baseline?: Pre-admission baseline Toileting: Needs assistance Is this a change from baseline?:  Pre-admission baseline In/Out Bed: Needs assistance Is this a change from baseline?: Pre-admission baseline Walks in Home: Needs assistance Is this a change from baseline?: Pre-admission baseline Does the patient have difficulty walking or climbing stairs?: Yes Weakness of Legs: Both Weakness of Arms/Hands: Both  Permission Sought/Granted                  Emotional Assessment              Admission diagnosis:  Cough [R05.9] Somnolence [R40.0] Acute encephalopathy [G93.40] Urinary tract infection without hematuria, site unspecified [N39.0] Altered mental status, unspecified altered mental status type [R41.82] Sepsis ([place]) [A41.9] Patient Active Problem List   Diagnosis Date Noted  . Sepsis ([place]) 06/26/2020  . Acute encephalopathy 06/25/2020  . DNR (do not resuscitate) 06/25/2020  . Urinary tract infection without hematuria   . Dyspnea   . Goals of care, counseling/discussion   . General weakness   . Parkinsonism ([place])   . Palliative care by specialist   . Pressure injury of skin 01/18/2020  . Acute respiratory failure with hypoxia ([place]) 01/17/2020  . Acute exacerbation of CHF (congestive heart failure) ([place]) 01/17/2020  . Gait instability 01/17/2016  . Routine general medical examination at a health care facility 07/20/2015  . Chronic venous insufficiency 01/03/2015  . Elevated troponin 11/24/2014  . Osteoarthritis of right lower extremity 11/13/2014  . Osteopenia 02/13/2014  . Essential hypertension   . History of breast cancer 11/09/2013  . Seizures ([place]) 10/04/2013  . H/O aortic valve replacement TAVR 09/07/2013  . Hypothyroid   . Hyperlipidemia   . PHN (postherpetic neuralgia)   . GERD (gastroesophageal reflux disease)   . OAB (  overactive bladder)    PCP:  Shirlean Mylar, MD Pharmacy:   [facility] [phone] - [place], [place] - [address] [place] [address] Phone: [phone]  Fax: [phone]  [id number] [place], [place] - [address] AT [facility] [address] [facility] [address] [place] [address] Phone: [phone] Fax: [phone]  [facility] [place], [address] - [place], [place] - [address] [facility] [address] [place] [address] Phone: [phone] Fax: [phone]  [id number] [place], [place] - [address] [place] [address] Phone: [phone] Fax: [phone]  [facility] - [place], [place] - [address] [place], [address] [place], [address] [place] [address] Phone: [phone] Fax: [phone]     Social Determinants of Health (SDOH) Interventions    Readmission Risk Interventions No flowsheet data found.

## 2020-06-28 LAB — GLUCOSE, CAPILLARY
Glucose-Capillary: 113 mg/dL — ABNORMAL HIGH (ref 70–99)
Glucose-Capillary: 127 mg/dL — ABNORMAL HIGH (ref 70–99)
Glucose-Capillary: 128 mg/dL — ABNORMAL HIGH (ref 70–99)
Glucose-Capillary: 130 mg/dL — ABNORMAL HIGH (ref 70–99)

## 2020-06-28 MED ORDER — COLLAGENASE 250 UNIT/GM EX OINT
TOPICAL_OINTMENT | Freq: Every day | CUTANEOUS | Status: DC
  Administered 2020-06-30: 1 via TOPICAL
  Filled 2020-06-28: qty 30

## 2020-06-28 MED ORDER — RESOURCE THICKENUP CLEAR PO POWD
ORAL | Status: DC | PRN
  Filled 2020-06-28 (×2): qty 125

## 2020-06-28 MED ORDER — FOOD THICKENER (SIMPLYTHICK): 1.0000 | ORAL | Status: DC | PRN

## 2020-06-28 NOTE — Consult Note (Signed)
WOC Nurse Consult Note: Reason for Consult: bilateral heel ulcers Patient from SNF; bedbound Wound type: pressure injures Left heel: Stage 3  Right heel: Unstageable  Pressure Injury POA: Yes Measurement: Right heel: 1.0cm x 2.0cm x 0.2cm  Left heel: see nursing notes Wound bed: Right heel: 100% dark non viable tissue Left heel: pale pink 100%; full thickness  Drainage (amount, consistency, odor) minimal from both heels  Periwound:intact  Dressing procedure/placement/frequency: 1. Silicone heel dressing to the left heel; change every 3 days 2. Enzymatic debridement ointment to the right heel daily; top with saline moist gauze and foam 3. Prevalon boots bilaterally for offloading and to be sent back to SNF for use.   Discussed POC with  bedside nurse.  Re consult if needed, will not follow at this time. Thanks  Addisen Chappelle [employer], RN,CWOCN, CNS, CWON-AP [phone])

## 2020-06-28 NOTE — Progress Notes (Signed)
  Speech Language Pathology Treatment: Dysphagia  Patient Details Name: Kaytelynn Scripter MRN: [id number] DOB: [birth year] Today's Date: 06/28/2020 Time: 7672-0947 SLP Time Calculation (min) (ACUTE ONLY): 11 min  Assessment / Plan / Recommendation Clinical Impression  Mental status worsened today per MD notes.  Pt was alert, asking about crackers and banana splits.  Repositioned in bed - with hand over hand assist pt able to drink from a cup with good oral anticipation/attention and no s/s of aspiration with nectar-thick liquids. Consumed container of applesauce with adequate oral control and no concerns for safety.  Voice was clear throughout; no coughing.  Continue current diet - SLP will f/u to determine if liquids can safely be advanced when mental status returns to baseline.   HPI HPI: Toniyah Dilmore is a 87 y.o. female with known history of progressive supranuclear palsy ? Parkinsons plus, status post TAVR and chronic systolic heart failure, hypothyroidism dementia was brought to the ER after patient was found to be having change in mental status since morning.  Pt with heel wound with concern for osteomyelitis.  CXR negative, mri motion degraded , RA, hypotensive suspect UTI, from SNF, RMST started last time- 01/30/2020 SLP services, dys3/thin known risk 01-20-20 ,mbs penetration of nectar, asp of thin *trace* did not clear, decr epiglottic deflection and airway closure.  Swallow evaluation ordered.    Per MD note, pt with some delirium but able to state she does not want to eat.      SLP Plan  Continue with current plan of care       Recommendations  Diet recommendations: Dysphagia 1 (puree);Nectar-thick liquid Liquids provided via: Cup;Straw Medication Administration: Crushed with puree Supervision: Full supervision/cueing for compensatory strategies;Trained caregiver to feed patient Compensations: Minimize environmental distractions;Slow rate;Small sips/bites Postural Changes and/or  Swallow Maneuvers: Seated upright 90 degrees;Upright 30-60 min after meal                Oral Care Recommendations: Oral care BID Follow up Recommendations: Skilled Nursing facility;24 hour supervision/assistance SLP Visit Diagnosis: Dysphagia, oropharyngeal phase (R13.12) Plan: Continue with current plan of care       GO                Blenda Mounts Laurice 06/28/2020, 4:34 PM  Asmar Brozek L. Samson Frederic, MA CCC/SLP Acute Rehabilitation Services Office number [phone] Pager [phone]

## 2020-06-28 NOTE — Progress Notes (Signed)
PROGRESS NOTE    Betty Chan  [id number] DOB: 1930-01-05 DOA: 06/25/2020 PCP: Maurice Small, MD   Brief Narrative: Betty Chan is a 87 y.o. female with a history of severe headache stenosis status post TAVR, chronic systolic heart failure, chronic hypoxia, history of breast cancer status post lumpectomy, progressive supranuclear palsy, hypertension, hyperlipidemia, hypothyroidism.  Patient presented secondary to worsening mental status with evidence of sepsis and found to have a urinary tract infection.  She has been improving with IV antibiotics.   Assessment & Plan:   Principal Problem:   Acute encephalopathy Active Problems:   Hypothyroid   Hyperlipidemia   History of breast cancer   Essential hypertension   H/O aortic valve replacement TAVR   Pressure injury of skin   DNR (do not resuscitate)   Sepsis ([place])   Acute metabolic encephalopathy Appears to be secondary to urinary tract infection.  Patient is significant improved from admission.  Appeared to be resolved, however today patient is having worsening mental status with some hallucinations. Medications reviewed and unlikely secondary to medications -Observe for now  Sepsis Present on admission.  Secondary to UTI versus infected heel ulcer.  No osteomyelitis noted on MRI.  Urine culture significant for E. Coli. She is being treated with Ceftriaxone  UTI E. coli.  Resistant to amoxicillin.  Patient is currently on ceftriaxone. -Continue ceftriaxone IV  Right foot ulcer Left heel.  CT negative for osteomyelitis.  Possible infection.  Will manage with antibiotics as mentioned above. -Continue Ceftriaxone IV as mentioned above  AKI Baseline creatinine appears to be around 1.1.  Creatinine of 1.42 on admission with improvement and resolution of AKI with IV fluids. -Discontinue IV fluids  Dysphagia In setting of underlying Parkinson disease in addition to metabolic encephalopathy.  Speech is reevaluated  and is recommending dysphagia diet with nectar thick liquids. History of silent aspiration. -SLP recommendations: Dysphagia 1 diet, nectar thickened liquids  Positive blood cultures Only one set of cultures. Culture significant for staphylococcus warneri. Likely contaminant. Started on Vancomycin IV on 1/4 which is discontinued.  Severe aortic stenosis Patient status post TAVR.  Complicated by history of systolic heart failure.  Chronic systolic heart failure Patient appears to be euvolemic.  Patient is on lisinopril 5 mg, metoprolol tartrate 50 mg twice daily, Lasix 20 mg daily.  Blood pressure currently on softer side.  Medications held secondary to poor mental status on admission. -Continue home metoprolol at lower dose of 25 mg twice daily -Hold lisinopril and Lasix secondary to blood pressure and AKI  Progressive supranuclear palsy Parkinson disease -Continue Sinemet  Essential hypertension -metoprolol as mentioned above -Lisinopril/Lasix as mentioned above  Hypothyroidism -Continue Synthroid 50 micrograms  Breast cancer Patient is status post lumpectomy.  Pressure injury Left heel, POA   DVT prophylaxis: Lovenox Code Status:   Code Status: DNR Family Communication: None at bedside. Called daughter, but no response Disposition Plan: Discharge back to facility likely in 24-48 hours pending ability to take oral nutrition   Consultants:   None  Procedures:   None  Antimicrobials:  Ceftriaxone  Vancomycin    Subjective: Patient is referencing a little girl in the room this morning.  Objective: Vitals:   06/27/20 1333 06/27/20 1949 06/28/20 0624 06/28/20 1402  BP: (!) 106/49 (!) 115/53 99/67 112/66  Pulse: (!) 55 74 78 (!) 40  Resp: 17 14 16 16   Temp: 97.6 F (36.4 C) 98.4 F (36.9 C) (!) 97.4 F (36.3 C)   TempSrc: Oral Oral Oral  SpO2: 99% 100% 94% 99%  Weight:      Height:        Intake/Output Summary (Last 24 hours) at 06/28/2020 1426 Last  data filed at 06/28/2020 1000 Gross per 24 hour  Intake 740 ml  Output 400 ml  Net 340 ml   Filed Weights   06/26/20 1000  Weight: 77.1 kg    Examination:  General exam: Appears calm and comfortable Respiratory system: Clear to auscultation. Respiratory effort normal. Cardiovascular system: S1 & S2 heard, RRR. No murmurs, rubs, gallops or clicks. Gastrointestinal system: Abdomen is nondistended, soft and nontender. No organomegaly or masses felt. Normal bowel sounds heard. Central nervous system: Alert and oriented to self. Musculoskeletal: No calf tenderness Skin: No cyanosis. No rashes Psychiatry: hallucination stating there is a girl in the room   Data Reviewed: I have personally reviewed following labs and imaging studies  CBC Lab Results  Component Value Date   WBC 9.0 06/27/2020   RBC 3.90 06/27/2020   HGB 12.8 06/27/2020   HCT 39.2 06/27/2020   MCV 100.5 (H) 06/27/2020   MCH 32.8 06/27/2020   PLT 185 06/27/2020   MCHC 32.7 06/27/2020   RDW 14.5 06/27/2020   LYMPHSABS 0.9 06/27/2020   MONOABS 0.8 06/27/2020   EOSABS 0.1 06/27/2020   BASOSABS 0.0 [id number]     Last metabolic panel Lab Results  Component Value Date   NA 140 06/27/2020   K 4.3 06/27/2020   CL 105 06/27/2020   CO2 24 06/27/2020   BUN 34 (H) 06/27/2020   CREATININE 1.14 (H) 06/27/2020   GLUCOSE 112 (H) 06/27/2020   GFRNONAA 46 (L) 06/27/2020   GFRAA 36 (L) 01/29/2020   CALCIUM 9.0 06/27/2020   PROT 7.0 06/27/2020   ALBUMIN 3.0 (L) 06/27/2020   BILITOT 1.1 06/27/2020   ALKPHOS 77 06/27/2020   AST 30 06/27/2020   ALT 35 06/27/2020   ANIONGAP 11 06/27/2020    CBG (last 3)  Recent Labs    06/28/20 0003 06/28/20 0620 06/28/20 1258  GLUCAP 128* 127* 113*     GFR: Estimated Creatinine Clearance: 36.5 mL/min (A) (by C-G formula based on SCr of 1.14 mg/dL (H)).  Coagulation Profile: No results for input(s): INR, PROTIME in the last 168 hours.  Recent Results (from the past 240  hour(s))  Blood culture (routine x 2)     Status: None (Preliminary result)   Collection Time: 06/25/20 12:55 PM   Specimen: BLOOD  Result Value Ref Range Status   Specimen Description   Final    BLOOD LEFT ANTECUBITAL Performed at [facility] [address] [place], [place] [address]    Special Requests   Final    BOTTLES DRAWN AEROBIC AND ANAEROBIC Blood Culture adequate volume Performed at [facility] [address] [place], [place] [address]    Culture   Final    NO GROWTH 3 DAYS Performed at [facility], [place] [address], [place], [place] [address]    Report Status PENDING  Incomplete  Blood culture (routine x 2)     Status: Abnormal   Collection Time: 06/25/20 12:55 PM   Specimen: BLOOD LEFT WRIST  Result Value Ref Range Status   Specimen Description   Final    BLOOD LEFT WRIST Performed at [facility] [address] [place], [place] [address]    Special Requests   Final    BOTTLES DRAWN AEROBIC ONLY Blood Culture results may not be  optimal due to an inadequate volume of blood received in culture bottles Performed at [facility], [place], [address], [place], [place] [address]    Culture  Setup Time   Final    GRAM POSITIVE COCCI IN CLUSTERS AEROBIC BOTTLE ONLY CRITICAL RESULT CALLED TO, READ BACK BY AND VERIFIED WITH: Haze Boyden PharmD 10:00 06/26/20 ([email or web address])    Culture (A)  Final    STAPHYLOCOCCUS WARNERI THE SIGNIFICANCE OF ISOLATING THIS ORGANISM FROM A SINGLE SET OF BLOOD CULTURES WHEN MULTIPLE SETS ARE DRAWN IS UNCERTAIN. PLEASE NOTIFY THE MICROBIOLOGY DEPARTMENT WITHIN ONE WEEK IF SPECIATION AND SENSITIVITIES ARE REQUIRED. Performed at [facility], [address], [place], [place] [address]    Report Status 06/27/2020 FINAL  Final  Blood Culture ID Panel (Reflexed)     Status: Abnormal   Collection Time: 06/25/20 12:55 PM  Result Value Ref Range Status   Enterococcus faecalis NOT  DETECTED NOT DETECTED Final   Enterococcus Faecium NOT DETECTED NOT DETECTED Final   Listeria monocytogenes NOT DETECTED NOT DETECTED Final   Staphylococcus species DETECTED (A) NOT DETECTED Final    Comment: CRITICAL RESULT CALLED TO, READ BACK BY AND VERIFIED WITH: Haze Boyden PharmD 10:00 06/26/20 ([email or web address])    Staphylococcus aureus (BCID) NOT DETECTED NOT DETECTED Final   Staphylococcus epidermidis NOT DETECTED NOT DETECTED Final   Staphylococcus lugdunensis NOT DETECTED NOT DETECTED Final   Streptococcus species NOT DETECTED NOT DETECTED Final   Streptococcus agalactiae NOT DETECTED NOT DETECTED Final   Streptococcus pneumoniae NOT DETECTED NOT DETECTED Final   Streptococcus pyogenes NOT DETECTED NOT DETECTED Final   A.calcoaceticus-baumannii NOT DETECTED NOT DETECTED Final   Bacteroides fragilis NOT DETECTED NOT DETECTED Final   Enterobacterales NOT DETECTED NOT DETECTED Final   Enterobacter cloacae complex NOT DETECTED NOT DETECTED Final   Escherichia coli NOT DETECTED NOT DETECTED Final   Klebsiella aerogenes NOT DETECTED NOT DETECTED Final   Klebsiella oxytoca NOT DETECTED NOT DETECTED Final   Klebsiella pneumoniae NOT DETECTED NOT DETECTED Final   Proteus species NOT DETECTED NOT DETECTED Final   Salmonella species NOT DETECTED NOT DETECTED Final   Serratia marcescens NOT DETECTED NOT DETECTED Final   Haemophilus influenzae NOT DETECTED NOT DETECTED Final   Neisseria meningitidis NOT DETECTED NOT DETECTED Final   Pseudomonas aeruginosa NOT DETECTED NOT DETECTED Final   Stenotrophomonas maltophilia NOT DETECTED NOT DETECTED Final   Candida albicans NOT DETECTED NOT DETECTED Final   Candida auris NOT DETECTED NOT DETECTED Final   Candida glabrata NOT DETECTED NOT DETECTED Final   Candida krusei NOT DETECTED NOT DETECTED Final   Candida parapsilosis NOT DETECTED NOT DETECTED Final   Candida tropicalis NOT DETECTED NOT DETECTED Final   Cryptococcus neoformans/gattii NOT DETECTED  NOT DETECTED Final    Comment: Performed at [facility], [address] [place], [place] [address]  Resp Panel by RT-PCR (Flu A&B, Covid) Nasopharyngeal Swab     Status: None   Collection Time: 06/25/20 12:59 PM   Specimen: Nasopharyngeal Swab; Nasopharyngeal(NP) swabs in vial transport medium  Result Value Ref Range Status   SARS Coronavirus 2 by RT PCR NEGATIVE NEGATIVE Final    Comment: (NOTE) SARS-CoV-2 target nucleic acids are NOT DETECTED.  The SARS-CoV-2 RNA is generally detectable in upper respiratory specimens during the acute phase of infection. The lowest concentration of SARS-CoV-2 viral copies this assay can detect is 138 copies/mL. A negative result does not preclude SARS-Cov-2 infection and should not be used as  the sole basis for treatment or other patient management decisions. A negative result may occur with  improper specimen collection/handling, submission of specimen other than nasopharyngeal swab, presence of viral mutation(s) within the areas targeted by this assay, and inadequate number of viral copies(<138 copies/mL). A negative result must be combined with clinical observations, patient history, and epidemiological information. The expected result is Negative.  Fact Sheet for Patients:  EntrepreneurPulse.com.au  Fact Sheet for Healthcare Providers:  IncredibleEmployment.be  This test is no t yet approved or cleared by the [place] FDA and  has been authorized for detection and/or diagnosis of SARS-CoV-2 by FDA under an Emergency Use Authorization (EUA). This EUA will remain  in effect (meaning this test can be used) for the duration of the COVID-19 declaration under Section 564(b)(1) of the Act, 21 U.S.C.section 360bbb-3(b)(1), unless the authorization is terminated  or revoked sooner.       Influenza A by PCR NEGATIVE NEGATIVE Final   Influenza B by PCR NEGATIVE NEGATIVE Final    Comment: (NOTE) The  Xpert Xpress SARS-CoV-2/FLU/RSV plus assay is intended as an aid in the diagnosis of influenza from Nasopharyngeal swab specimens and should not be used as a sole basis for treatment. Nasal washings and aspirates are unacceptable for Xpert Xpress SARS-CoV-2/FLU/RSV testing.  Fact Sheet for Patients: EntrepreneurPulse.com.au  Fact Sheet for Healthcare Providers: IncredibleEmployment.be  This test is not yet approved or cleared by the [place] FDA and has been authorized for detection and/or diagnosis of SARS-CoV-2 by FDA under an Emergency Use Authorization (EUA). This EUA will remain in effect (meaning this test can be used) for the duration of the COVID-19 declaration under Section 564(b)(1) of the Act, 21 U.S.C. section 360bbb-3(b)(1), unless the authorization is terminated or revoked.  Performed at [facility], [place] [address] [place], [place] [address]   Urine culture     Status: Abnormal   Collection Time: 06/25/20  4:30 PM   Specimen: Urine, Random  Result Value Ref Range Status   Specimen Description   Final    URINE, RANDOM Performed at [facility] [address], [place],  [address]    Special Requests   Final    NONE Performed at [facility], [place] [address] [place], [place] [address]    Culture >=100,000 COLONIES/mL ESCHERICHIA COLI (A)  Final   Report Status 06/27/2020 FINAL  Final   Organism ID, Bacteria ESCHERICHIA COLI (A)  Final      Susceptibility   Escherichia coli - MIC*    AMPICILLIN 4 SENSITIVE Sensitive     CEFAZOLIN <=4 SENSITIVE Sensitive     CEFEPIME <=0.12 SENSITIVE Sensitive     CEFTRIAXONE <=0.25 SENSITIVE Sensitive     CIPROFLOXACIN <=0.25 SENSITIVE Sensitive     GENTAMICIN <=1 SENSITIVE Sensitive     IMIPENEM <=0.25 SENSITIVE Sensitive     NITROFURANTOIN <=16 SENSITIVE Sensitive     TRIMETH/SULFA <=20 SENSITIVE Sensitive      AMPICILLIN/SULBACTAM <=2 SENSITIVE Sensitive     PIP/TAZO <=4 SENSITIVE Sensitive     * >=100,000 COLONIES/mL ESCHERICHIA COLI  MRSA PCR Screening     Status: Abnormal   Collection Time: 06/26/20 12:53 AM   Specimen: Nasal Mucosa; Nasopharyngeal  Result Value Ref Range Status   MRSA by PCR POSITIVE (A) NEGATIVE Final    Comment:        The GeneXpert MRSA Assay (FDA approved for NASAL specimens only), is one component of a comprehensive MRSA colonization  surveillance program. It is not intended to diagnose MRSA infection nor to guide or monitor treatment for MRSA infections. RESULT CALLED TO, READ BACK BY AND VERIFIED WITH: RIBINSON K. 12.04.22 @ 0210 BY MECIAL J. Performed at [facility], [place] [address] [place], [place] [address]         Radiology Studies: No results found.      Scheduled Meds: . atorvastatin  20 mg Oral Q supper  . carbidopa-levodopa  1 tablet Oral QHS  . Chlorhexidine Gluconate Cloth  6 each Topical Q0600  . collagenase   Topical Daily  . enoxaparin (LOVENOX) injection  40 mg Subcutaneous Q24H  . levothyroxine  50 mcg Oral Q0600  . metoprolol tartrate  25 mg Oral BID  . mupirocin ointment  1 application Nasal BID   Continuous Infusions: . sodium chloride 50 mL/hr at 06/28/20 0624  . cefTRIAXone (ROCEPHIN)  IV Stopped (06/27/20 2028)     LOS: 2 days     Cordelia Poche, MD [facility] 06/28/2020, 2:26 PM  If 7PM-7AM, please contact night-coverage [email or web address]

## 2020-06-29 ENCOUNTER — Inpatient Hospital Stay (HOSPITAL_COMMUNITY): Payer: Medicare Other

## 2020-06-29 LAB — CBC
HCT: 35.5 % — ABNORMAL LOW (ref 36.0–46.0)
Hemoglobin: 11.1 g/dL — ABNORMAL LOW (ref 12.0–15.0)
MCH: 33.1 pg (ref 26.0–34.0)
MCHC: 31.3 g/dL (ref 30.0–36.0)
MCV: 106 fL — ABNORMAL HIGH (ref 80.0–100.0)
Platelets: 195 10*3/uL (ref 150–400)
RBC: 3.35 MIL/uL — ABNORMAL LOW (ref 3.87–5.11)
RDW: 14.5 % (ref 11.5–15.5)
WBC: 6.6 10*3/uL (ref 4.0–10.5)
nRBC: 0 % (ref 0.0–0.2)

## 2020-06-29 LAB — GLUCOSE, CAPILLARY
Glucose-Capillary: 104 mg/dL — ABNORMAL HIGH (ref 70–99)
Glucose-Capillary: 107 mg/dL — ABNORMAL HIGH (ref 70–99)
Glucose-Capillary: 113 mg/dL — ABNORMAL HIGH (ref 70–99)
Glucose-Capillary: 117 mg/dL — ABNORMAL HIGH (ref 70–99)
Glucose-Capillary: 139 mg/dL — ABNORMAL HIGH (ref 70–99)
Glucose-Capillary: 89 mg/dL (ref 70–99)

## 2020-06-29 LAB — COMPREHENSIVE METABOLIC PANEL
ALT: 13 U/L (ref 0–44)
AST: 26 U/L (ref 15–41)
Albumin: 2.7 g/dL — ABNORMAL LOW (ref 3.5–5.0)
Alkaline Phosphatase: 68 U/L (ref 38–126)
Anion gap: 11 (ref 5–15)
BUN: 26 mg/dL — ABNORMAL HIGH (ref 8–23)
CO2: 22 mmol/L (ref 22–32)
Calcium: 8.7 mg/dL — ABNORMAL LOW (ref 8.9–10.3)
Chloride: 113 mmol/L — ABNORMAL HIGH (ref 98–111)
Creatinine, Ser: 1.11 mg/dL — ABNORMAL HIGH (ref 0.44–1.00)
GFR, Estimated: 48 mL/min — ABNORMAL LOW (ref >60)
Glucose, Bld: 123 mg/dL — ABNORMAL HIGH (ref 70–99)
Potassium: 4 mmol/L (ref 3.5–5.1)
Sodium: 146 mmol/L — ABNORMAL HIGH (ref 135–145)
Total Bilirubin: 0.7 mg/dL (ref 0.3–1.2)
Total Protein: 6.3 g/dL — ABNORMAL LOW (ref 6.5–8.1)

## 2020-06-29 LAB — AMMONIA: Ammonia: 18 umol/L (ref 9–35)

## 2020-06-29 MED ORDER — SODIUM CHLORIDE 0.45 % IV SOLN: INTRAVENOUS | Status: DC

## 2020-06-29 NOTE — Progress Notes (Signed)
PROGRESS NOTE    Betty Chan  [id number] DOB: Nov 19, 1929 DOA: 06/25/2020 PCP: Maurice Small, MD   Brief Narrative: Betty Chan is a 87 y.o. female with a history of severe headache stenosis status post TAVR, chronic systolic heart failure, chronic hypoxia, history of breast cancer status post lumpectomy, progressive supranuclear palsy, hypertension, hyperlipidemia, hypothyroidism.  Patient presented secondary to worsening mental status with evidence of sepsis and found to have a urinary tract infection.  She has been improving with IV antibiotics.   Assessment & Plan:   Principal Problem:   Acute encephalopathy Active Problems:   Hypothyroid   Hyperlipidemia   History of breast cancer   Essential hypertension   H/O aortic valve replacement TAVR   Pressure injury of skin   DNR (do not resuscitate)   Sepsis ([place])   Acute metabolic encephalopathy Appears to be secondary to urinary tract infection.  Patient is significant improved from admission.  Appeared to be resolved, however today patient is having worsening mental status with some hallucinations. Medications reviewed and unlikely secondary to medications -Observe for now  Sepsis Present on admission.  Secondary to UTI versus infected heel ulcer.  No osteomyelitis noted on MRI.  Urine culture significant for E. Coli. She is being treated with Ceftriaxone. Recurrent fever overnight on 1/6. No leukocytosis. -Repeat urine/blood cultures  UTI E. coli.  Resistant to amoxicillin.  Patient is currently on ceftriaxone. -Continue ceftriaxone IV  Right foot ulcer Left heel.  CT negative for osteomyelitis.  Possible infection.  Will manage with antibiotics as mentioned above. -Continue Ceftriaxone IV as mentioned above  AKI Baseline creatinine appears to be around 1.1.  Creatinine of 1.42 on admission with improvement and resolution of AKI with IV fluids.  Dysphagia In setting of underlying Parkinson disease in  addition to metabolic encephalopathy.  Speech is reevaluated and is recommending dysphagia diet with nectar thick liquids. History of silent aspiration. -SLP recommendations: Dysphagia 1 diet, nectar thickened liquids  Positive blood cultures Only one set of cultures. Culture significant for staphylococcus warneri. Likely contaminant. Started on Vancomycin IV on 1/4 which is discontinued.  Severe aortic stenosis Patient status post TAVR.  Complicated by history of systolic heart failure.  Hypernatremia Mild -1/2 NS IV fluids  Acute metabolic encephalopathy Unknown etiology. Patient tends to become confused/encephalopathic in the mornings. Associated fever overnight. Aspiration risk as well. Chest x-ray without clear evidence of aspiration event. She has some mild hypernatremia with associated hypochloridemia. Ammonia obtained and is normal. -Monitor -Adjust IV fluids  Chronic systolic heart failure Patient appears to be euvolemic.  Patient is on lisinopril 5 mg, metoprolol tartrate 50 mg twice daily, Lasix 20 mg daily.  Blood pressure currently on softer side.  Medications held secondary to poor mental status on admission. -Continue home metoprolol at lower dose of 25 mg twice daily -Hold lisinopril and Lasix secondary to blood pressure and AKI  Bradycardia In setting of metoprolol use. Could also be contributing to mental status issues. -Discontinue metoprolol  Progressive supranuclear palsy Parkinson disease -Continue Sinemet  Essential hypertension -metoprolol as mentioned above -Lisinopril/Lasix as mentioned above  Hypothyroidism -Continue Synthroid 50 micrograms  Breast cancer Patient is status post lumpectomy.  Pressure injury Left heel, POA   DVT prophylaxis: Lovenox Code Status:   Code Status: DNR Family Communication: Daughter at bedside Disposition Plan: Discharge back to facility likely in 48 hours pending ability to take oral nutrition in addition to stable  mental status and resolution of fevers.   Consultants:  None  Procedures:   None  Antimicrobials:  Ceftriaxone  Vancomycin    Subjective: Patient is not very responsive today.   Objective: Vitals:   06/29/20 0018 06/29/20 0530 06/29/20 1100 06/29/20 1302  BP:  131/82  125/87  Pulse:  (!) 59  (!) 38  Resp:  16    Temp: 98.9 F (37.2 C) 99 F (37.2 C)  97.8 F (36.6 C)  TempSrc: Oral Oral  Oral  SpO2:  99% 98% 97%  Weight:      Height:        Intake/Output Summary (Last 24 hours) at 06/29/2020 1327 Last data filed at 06/29/2020 0932 Gross per 24 hour  Intake 1059.64 ml  Output 720 ml  Net 339.64 ml   Filed Weights   06/26/20 1000  Weight: 77.1 kg    Examination:  General exam: Appears calm and comfortable Respiratory system: Diminished but clear. Respiratory effort normal. Cardiovascular system: S1 & S2 heard, Rate is somewhat slow, arrythmia. No murmurs, rubs, gallops or clicks. Gastrointestinal system: Abdomen is nondistended, soft and nontender. No organomegaly or masses felt. Normal bowel sounds heard. Central nervous system: lethargic. Responds to painful stimuli with localization. Follows simple commands. Musculoskeletal: No calf tenderness Skin: No cyanosis. No rashes   Data Reviewed: I have personally reviewed following labs and imaging studies  CBC Lab Results  Component Value Date   WBC 6.6 06/29/2020   RBC 3.35 (L) 06/29/2020   HGB 11.1 (L) 06/29/2020   HCT 35.5 (L) 06/29/2020   MCV 106.0 (H) 06/29/2020   MCH 33.1 06/29/2020   PLT 195 06/29/2020   MCHC 31.3 06/29/2020   RDW 14.5 06/29/2020   LYMPHSABS 0.9 06/27/2020   MONOABS 0.8 06/27/2020   EOSABS 0.1 06/27/2020   BASOSABS 0.0 83/38/2505     Last metabolic panel Lab Results  Component Value Date   NA 146 (H) 06/29/2020   K 4.0 06/29/2020   CL 113 (H) 06/29/2020   CO2 22 06/29/2020   BUN 26 (H) 06/29/2020   CREATININE 1.11 (H) 06/29/2020   GLUCOSE 123 (H) 06/29/2020    GFRNONAA 48 (L) 06/29/2020   GFRAA 36 (L) 01/29/2020   CALCIUM 8.7 (L) 06/29/2020   PROT 6.3 (L) 06/29/2020   ALBUMIN 2.7 (L) 06/29/2020   BILITOT 0.7 06/29/2020   ALKPHOS 68 06/29/2020   AST 26 06/29/2020   ALT 13 06/29/2020   ANIONGAP 11 06/29/2020    CBG (last 3)  Recent Labs    06/29/20 0540 06/29/20 0727 06/29/20 1138  GLUCAP 107* 117* 113*     GFR: Estimated Creatinine Clearance: 37.5 mL/min (A) (by C-G formula based on SCr of 1.11 mg/dL (H)).  Coagulation Profile: No results for input(s): INR, PROTIME in the last 168 hours.  Recent Results (from the past 240 hour(s))  Blood culture (routine x 2)     Status: None (Preliminary result)   Collection Time: 06/25/20 12:55 PM   Specimen: BLOOD  Result Value Ref Range Status   Specimen Description   Final    BLOOD LEFT ANTECUBITAL Performed at [facility] [address], [place], [place] [address]    Special Requests   Final    BOTTLES DRAWN AEROBIC AND ANAEROBIC Blood Culture adequate volume Performed at [facility] [address], [place], [place] [address]    Culture   Final    NO GROWTH 3 DAYS Performed at [facility], [place] [address] [place], [place] [address]  Report Status PENDING  Incomplete  Blood culture (routine x 2)     Status: Abnormal   Collection Time: 06/25/20 12:55 PM   Specimen: BLOOD LEFT WRIST  Result Value Ref Range Status   Specimen Description   Final    BLOOD LEFT WRIST Performed at [facility], [address], [place], [place] [address]    Special Requests   Final    BOTTLES DRAWN AEROBIC ONLY Blood Culture results may not be optimal due to an inadequate volume of blood received in culture bottles Performed at [facility] [address], [place], [place] [address]    Culture  Setup Time   Final    GRAM POSITIVE COCCI IN CLUSTERS AEROBIC BOTTLE ONLY CRITICAL RESULT CALLED TO, READ BACK BY AND VERIFIED WITH:  Guadlupe Spanish PharmD 10:00 06/26/20 ([email or web address])    Culture (A)  Final    STAPHYLOCOCCUS WARNERI THE SIGNIFICANCE OF ISOLATING THIS ORGANISM FROM A SINGLE SET OF BLOOD CULTURES WHEN MULTIPLE SETS ARE DRAWN IS UNCERTAIN. PLEASE NOTIFY THE MICROBIOLOGY DEPARTMENT WITHIN ONE WEEK IF SPECIATION AND SENSITIVITIES ARE REQUIRED. Performed at [facility], [place] [address] [place], [place] [address]    Report Status 06/27/2020 FINAL  Final  Blood Culture ID Panel (Reflexed)     Status: Abnormal   Collection Time: 06/25/20 12:55 PM  Result Value Ref Range Status   Enterococcus faecalis NOT DETECTED NOT DETECTED Final   Enterococcus Faecium NOT DETECTED NOT DETECTED Final   Listeria monocytogenes NOT DETECTED NOT DETECTED Final   Staphylococcus species DETECTED (A) NOT DETECTED Final    Comment: CRITICAL RESULT CALLED TO, READ BACK BY AND VERIFIED WITH: Guadlupe Spanish PharmD 10:00 06/26/20 ([email or web address])    Staphylococcus aureus (BCID) NOT DETECTED NOT DETECTED Final   Staphylococcus epidermidis NOT DETECTED NOT DETECTED Final   Staphylococcus lugdunensis NOT DETECTED NOT DETECTED Final   Streptococcus species NOT DETECTED NOT DETECTED Final   Streptococcus agalactiae NOT DETECTED NOT DETECTED Final   Streptococcus pneumoniae NOT DETECTED NOT DETECTED Final   Streptococcus pyogenes NOT DETECTED NOT DETECTED Final   A.calcoaceticus-baumannii NOT DETECTED NOT DETECTED Final   Bacteroides fragilis NOT DETECTED NOT DETECTED Final   Enterobacterales NOT DETECTED NOT DETECTED Final   Enterobacter cloacae complex NOT DETECTED NOT DETECTED Final   Escherichia coli NOT DETECTED NOT DETECTED Final   Klebsiella aerogenes NOT DETECTED NOT DETECTED Final   Klebsiella oxytoca NOT DETECTED NOT DETECTED Final   Klebsiella pneumoniae NOT DETECTED NOT DETECTED Final   Proteus species NOT DETECTED NOT DETECTED Final   Salmonella species NOT DETECTED NOT DETECTED Final   Serratia marcescens NOT DETECTED NOT DETECTED Final    Haemophilus influenzae NOT DETECTED NOT DETECTED Final   Neisseria meningitidis NOT DETECTED NOT DETECTED Final   Pseudomonas aeruginosa NOT DETECTED NOT DETECTED Final   Stenotrophomonas maltophilia NOT DETECTED NOT DETECTED Final   Candida albicans NOT DETECTED NOT DETECTED Final   Candida auris NOT DETECTED NOT DETECTED Final   Candida glabrata NOT DETECTED NOT DETECTED Final   Candida krusei NOT DETECTED NOT DETECTED Final   Candida parapsilosis NOT DETECTED NOT DETECTED Final   Candida tropicalis NOT DETECTED NOT DETECTED Final   Cryptococcus neoformans/gattii NOT DETECTED NOT DETECTED Final    Comment: Performed at [facility], [address] [place], [place] [address]  Resp Panel by RT-PCR (Flu A&B, Covid) Nasopharyngeal Swab     Status: None   Collection Time: 06/25/20 12:59 PM  Specimen: Nasopharyngeal Swab; Nasopharyngeal(NP) swabs in vial transport medium  Result Value Ref Range Status   SARS Coronavirus 2 by RT PCR NEGATIVE NEGATIVE Final    Comment: (NOTE) SARS-CoV-2 target nucleic acids are NOT DETECTED.  The SARS-CoV-2 RNA is generally detectable in upper respiratory specimens during the acute phase of infection. The lowest concentration of SARS-CoV-2 viral copies this assay can detect is 138 copies/mL. A negative result does not preclude SARS-Cov-2 infection and should not be used as the sole basis for treatment or other patient management decisions. A negative result may occur with  improper specimen collection/handling, submission of specimen other than nasopharyngeal swab, presence of viral mutation(s) within the areas targeted by this assay, and inadequate number of viral copies(<138 copies/mL). A negative result must be combined with clinical observations, patient history, and epidemiological information. The expected result is Negative.  Fact Sheet for Patients:  EntrepreneurPulse.com.au  Fact Sheet for Healthcare Providers:   IncredibleEmployment.be  This test is no t yet approved or cleared by the [place] FDA and  has been authorized for detection and/or diagnosis of SARS-CoV-2 by FDA under an Emergency Use Authorization (EUA). This EUA will remain  in effect (meaning this test can be used) for the duration of the COVID-19 declaration under Section 564(b)(1) of the Act, 21 U.S.C.section 360bbb-3(b)(1), unless the authorization is terminated  or revoked sooner.       Influenza A by PCR NEGATIVE NEGATIVE Final   Influenza B by PCR NEGATIVE NEGATIVE Final    Comment: (NOTE) The Xpert Xpress SARS-CoV-2/FLU/RSV plus assay is intended as an aid in the diagnosis of influenza from Nasopharyngeal swab specimens and should not be used as a sole basis for treatment. Nasal washings and aspirates are unacceptable for Xpert Xpress SARS-CoV-2/FLU/RSV testing.  Fact Sheet for Patients: EntrepreneurPulse.com.au  Fact Sheet for Healthcare Providers: IncredibleEmployment.be  This test is not yet approved or cleared by the [place] FDA and has been authorized for detection and/or diagnosis of SARS-CoV-2 by FDA under an Emergency Use Authorization (EUA). This EUA will remain in effect (meaning this test can be used) for the duration of the COVID-19 declaration under Section 564(b)(1) of the Act, 21 U.S.C. section 360bbb-3(b)(1), unless the authorization is terminated or revoked.  Performed at [facility], [place] [address] [place], [place] [address]   Urine culture     Status: Abnormal   Collection Time: 06/25/20  4:30 PM   Specimen: Urine, Random  Result Value Ref Range Status   Specimen Description   Final    URINE, RANDOM Performed at [facility] [address] [place], [place] [address]    Special Requests   Final    NONE Performed at [facility], [place] [address] [place],   [address]    Culture >=100,000 COLONIES/mL ESCHERICHIA COLI (A)  Final   Report Status 06/27/2020 FINAL  Final   Organism ID, Bacteria ESCHERICHIA COLI (A)  Final      Susceptibility   Escherichia coli - MIC*    AMPICILLIN 4 SENSITIVE Sensitive     CEFAZOLIN <=4 SENSITIVE Sensitive     CEFEPIME <=0.12 SENSITIVE Sensitive     CEFTRIAXONE <=0.25 SENSITIVE Sensitive     CIPROFLOXACIN <=0.25 SENSITIVE Sensitive     GENTAMICIN <=1 SENSITIVE Sensitive     IMIPENEM <=0.25 SENSITIVE Sensitive     NITROFURANTOIN <=16 SENSITIVE Sensitive     TRIMETH/SULFA <=20 SENSITIVE Sensitive     AMPICILLIN/SULBACTAM <=2 SENSITIVE Sensitive  PIP/TAZO <=4 SENSITIVE Sensitive     * >=100,000 COLONIES/mL ESCHERICHIA COLI  MRSA PCR Screening     Status: Abnormal   Collection Time: 06/26/20 12:53 AM   Specimen: Nasal Mucosa; Nasopharyngeal  Result Value Ref Range Status   MRSA by PCR POSITIVE (A) NEGATIVE Final    Comment:        The GeneXpert MRSA Assay (FDA approved for NASAL specimens only), is one component of a comprehensive MRSA colonization surveillance program. It is not intended to diagnose MRSA infection nor to guide or monitor treatment for MRSA infections. RESULT CALLED TO, READ BACK BY AND VERIFIED WITH: RIBINSON K. 12.04.22 @ 0210 BY MECIAL J. Performed at [facility], [place] [address] [place], [place] [address]         Radiology Studies: DG CHEST PORT 1 VIEW  Result Date: 06/29/2020 CLINICAL DATA:  Hypoxia EXAM: PORTABLE CHEST 1 VIEW COMPARISON:  Four days ago FINDINGS: Cardiomegaly. Transcatheter aortic valve replacement. Chronic interstitial coarsening. There is no edema, consolidation, effusion, or pneumothorax. IMPRESSION: 1. Atelectatic type opacities at the bases. 2. Cardiomegaly. 3. Stable from prior. Electronically Signed   By: Monte Fantasia M.D.   On: 06/29/2020 10:49        Scheduled Meds: . atorvastatin  20 mg Oral Q supper  .  carbidopa-levodopa  1 tablet Oral QHS  . Chlorhexidine Gluconate Cloth  6 each Topical Q0600  . collagenase   Topical Daily  . enoxaparin (LOVENOX) injection  40 mg Subcutaneous Q24H  . levothyroxine  50 mcg Oral Q0600  . mupirocin ointment  1 application Nasal BID   Continuous Infusions: . sodium chloride 50 mL/hr at 06/28/20 2128  . cefTRIAXone (ROCEPHIN)  IV 1 g (06/28/20 2056)     LOS: 3 days     Cordelia Poche, MD [facility] 06/29/2020, 1:27 PM  If 7PM-7AM, please contact night-coverage [email or web address]

## 2020-06-29 NOTE — Progress Notes (Addendum)
   06/29/20 1302  Assess: MEWS Score  Temp 97.8 F (36.6 C)  BP 125/87  Pulse Rate (!) 38  SpO2 97 %  O2 Device Nasal Cannula  O2 Flow Rate (L/min) 2 L/min  Assess: MEWS Score  MEWS Temp 0  MEWS Systolic 0  MEWS Pulse 2  MEWS RR 0  MEWS LOC 0  MEWS Score 2  MEWS Score Color Yellow  Assess: if the MEWS score is Yellow or Red  Were vital signs taken at a resting state? Yes  Focused Assessment No change from prior assessment  Early Detection of Sepsis Score *See Row Information* Medium  MEWS guidelines implemented *See Row Information* No, vital signs rechecked  Treat  Pain Scale Faces  Pain Score 0  Take Vital Signs  Increase Vital Sign Frequency  Yellow: Q 2hr X 2 then Q 4hr X 2, if remains yellow, continue Q 4hrs  Escalate  MEWS: Escalate Yellow: discuss with charge nurse/RN and consider discussing with provider and RRT  Notify: Charge Nurse/RN  Name of Charge Nurse/RN Notified Holli, RN  Date Charge Nurse/RN Notified 06/29/20  Time Charge Nurse/RN Notified 1326  Notify: Provider  Provider Name/Title Nettey  Date Provider Notified 06/29/20  Time Provider Notified 1326  Notification Type Call  Notification Reason Change in status (hr low)  Response See new orders (discontinue metoprolol)  Date of Provider Response 06/29/20  Time of Provider Response 1327  Document  Patient Outcome Stabilized after interventions  Progress note created (see row info) Yes    Heart rate retaken within minutes and within normal limits (75). Mews intervention was not started.

## 2020-06-29 NOTE — Care Management Important Message (Signed)
Important Message  Patient Details IM Letter given to the Patient. Name: Betty Chan MRN: [id number] Date of Birth: Mar 23, 1930   Medicare Important Message Given:  Yes     Kerin Salen 06/29/2020, 11:42 AM

## 2020-06-30 LAB — BASIC METABOLIC PANEL
Anion gap: 10 (ref 5–15)
BUN: 28 mg/dL — ABNORMAL HIGH (ref 8–23)
CO2: 24 mmol/L (ref 22–32)
Calcium: 8.8 mg/dL — ABNORMAL LOW (ref 8.9–10.3)
Chloride: 113 mmol/L — ABNORMAL HIGH (ref 98–111)
Creatinine, Ser: 0.98 mg/dL (ref 0.44–1.00)
GFR, Estimated: 56 mL/min — ABNORMAL LOW (ref >60)
Glucose, Bld: 120 mg/dL — ABNORMAL HIGH (ref 70–99)
Potassium: 4 mmol/L (ref 3.5–5.1)
Sodium: 147 mmol/L — ABNORMAL HIGH (ref 135–145)

## 2020-06-30 LAB — GLUCOSE, CAPILLARY
Glucose-Capillary: 103 mg/dL — ABNORMAL HIGH (ref 70–99)
Glucose-Capillary: 106 mg/dL — ABNORMAL HIGH (ref 70–99)
Glucose-Capillary: 107 mg/dL — ABNORMAL HIGH (ref 70–99)
Glucose-Capillary: 111 mg/dL — ABNORMAL HIGH (ref 70–99)
Glucose-Capillary: 149 mg/dL — ABNORMAL HIGH (ref 70–99)

## 2020-06-30 LAB — URINE CULTURE: Culture: NO GROWTH

## 2020-06-30 LAB — CULTURE, BLOOD (ROUTINE X 2)
Culture: NO GROWTH
Special Requests: ADEQUATE

## 2020-06-30 MED ORDER — METOPROLOL SUCCINATE ER 25 MG PO TB24
12.5000 mg | ORAL_TABLET | Freq: Every day | ORAL | Status: DC
  Administered 2020-06-30 – 2020-07-03 (×3): 12.5 mg via ORAL
  Filled 2020-06-30 (×4): qty 1

## 2020-06-30 MED ORDER — DEXTROSE 5 % IV SOLN: INTRAVENOUS | Status: DC

## 2020-06-30 NOTE — Plan of Care (Signed)
  Problem: Coping: Goal: Level of anxiety will decrease Outcome: Progressing   Problem: Pain Managment: Goal: General experience of comfort will improve Outcome: Progressing   Problem: Safety: Goal: Ability to remain free from injury will improve Outcome: Progressing   Problem: Activity: Goal: Risk for activity intolerance will decrease Outcome: Not Progressing   Problem: Nutrition: Goal: Adequate nutrition will be maintained Outcome: Not Progressing

## 2020-06-30 NOTE — Progress Notes (Signed)
PROGRESS NOTE    Betty Chan  [id number] DOB: 05-14-1930 DOA: 06/25/2020 PCP: Maurice Small, MD   Brief Narrative: Betty Chan is a 87 y.o. female with a history of severe headache stenosis status post TAVR, chronic systolic heart failure, chronic hypoxia, history of breast cancer status post lumpectomy, progressive supranuclear palsy, hypertension, hyperlipidemia, hypothyroidism.  Patient presented secondary to worsening mental status with evidence of sepsis and found to have a urinary tract infection.  She has been improving with IV antibiotics.   Assessment & Plan:   Principal Problem:   Acute encephalopathy Active Problems:   Hypothyroid   Hyperlipidemia   History of breast cancer   Essential hypertension   H/O aortic valve replacement TAVR   Pressure injury of skin   DNR (do not resuscitate)   Sepsis ([place])   Acute metabolic encephalopathy Appears to be secondary to urinary tract infection.  Patient is significant improved from admission.  Appeared to be resolved, however today patient is having worsening mental status with some hallucinations. Medications reviewed and unlikely secondary to medications -Observe for now  Sepsis Present on admission.  Secondary to UTI versus infected heel ulcer.  No osteomyelitis noted on MRI.  Urine culture significant for E. Coli. She is being treated with Ceftriaxone. Recurrent fever overnight on 1/6. No leukocytosis. -Repeat urine/blood cultures pending  UTI E. coli.  Resistant to amoxicillin.  Patient is currently on ceftriaxone. -Continue ceftriaxone IV  Right foot ulcer Left heel.  CT negative for osteomyelitis.  Possible infection.  Will manage with antibiotics as mentioned above. -Continue Ceftriaxone IV as mentioned above  AKI Baseline creatinine appears to be around 1.1.  Creatinine of 1.42 on admission with improvement and resolution of AKI with IV fluids.  Dysphagia In setting of underlying Parkinson disease  in addition to metabolic encephalopathy.  Speech is reevaluated and is recommending dysphagia diet with nectar thick liquids. History of silent aspiration. -SLP recommendations: Dysphagia 1 diet, nectar thickened liquids  Positive blood cultures Only one set of cultures. Culture significant for staphylococcus warneri. Likely contaminant. Started on Vancomycin IV on 1/4 which is discontinued.  Severe aortic stenosis Patient status post TAVR.  Complicated by history of systolic heart failure.  Hypernatremia Mild -Switch to D5 water @ [id number]  Acute metabolic encephalopathy Unknown etiology. Patient tends to become confused/encephalopathic in the mornings. Associated fever overnight. Aspiration risk as well. Chest x-ray without clear evidence of aspiration event. She has some mild hypernatremia with associated hypochloridemia. Ammonia obtained and is normal.  -Monitor -Adjust IV fluids  Chronic systolic heart failure Patient appears to be euvolemic.  Patient is on lisinopril 5 mg, metoprolol tartrate 50 mg twice daily, Lasix 20 mg daily.  Blood pressure currently on softer side.  Medications held secondary to poor mental status on admission. -Continue home metoprolol at lower dose of 25 mg twice daily -Hold lisinopril and Lasix secondary to blood pressure and AKI  Bradycardia In setting of metoprolol use. Could also be contributing to mental status issues. -Restart metoprolol XL at 12.5 mg daily  Progressive supranuclear palsy Parkinson disease -Continue Sinemet  Essential hypertension -Metoprolol as mentioned above -Lisinopril/Lasix as mentioned above  Hypothyroidism -Continue Synthroid 50 micrograms  Breast cancer Patient is status post lumpectomy.  Pressure injury Left heel, POA   DVT prophylaxis: Lovenox Code Status:   Code Status: DNR Family Communication: Daughter at bedside Disposition Plan: Discharge back to facility likely in 48 hours pending ability to take oral  nutrition in addition to stable mental status  and resolution of fevers and results of repeat cultures   Consultants:   None  Procedures:   None  Antimicrobials:  Ceftriaxone  Vancomycin    Subjective: No concerns today  Objective: Vitals:   06/29/20 1310 06/29/20 1514 06/29/20 2039 06/30/20 0417  BP:  (!) 115/55 (!) 112/59 115/68  Pulse: 75 60 64 77  Resp:  15 14 14   Temp:  98.3 F (36.8 C) 98.7 F (37.1 C) (!) 97.5 F (36.4 C)  TempSrc:  Oral Oral Oral  SpO2:  97% 98% 96%  Weight:      Height:        Intake/Output Summary (Last 24 hours) at 06/30/2020 1003 Last data filed at 06/30/2020 0648 Gross per 24 hour  Intake 1577.11 ml  Output 400 ml  Net 1177.11 ml   Filed Weights   06/26/20 1000  Weight: 77.1 kg    Examination:  General exam: Appears calm and comfortable Respiratory system: Clear to auscultation. Respiratory effort normal. Cardiovascular system: S1 & S2 heard, RRR. No murmurs, rubs, gallops or clicks. Gastrointestinal system: Abdomen is nondistended, soft and nontender. No organomegaly or masses felt. Normal bowel sounds heard. Central nervous system: Alert and oriented to person. Musculoskeletal: No LE edema but some mild UE edema. No calf tenderness Skin: No cyanosis. No rashes Psychiatry: Blunt affect  Data Reviewed: I have personally reviewed following labs and imaging studies  CBC Lab Results  Component Value Date   WBC 6.6 06/29/2020   RBC 3.35 (L) 06/29/2020   HGB 11.1 (L) 06/29/2020   HCT 35.5 (L) 06/29/2020   MCV 106.0 (H) 06/29/2020   MCH 33.1 06/29/2020   PLT 195 06/29/2020   MCHC 31.3 06/29/2020   RDW 14.5 06/29/2020   LYMPHSABS 0.9 06/27/2020   MONOABS 0.8 06/27/2020   EOSABS 0.1 06/27/2020   BASOSABS 0.0 61/95/0932     Last metabolic panel Lab Results  Component Value Date   NA 147 (H) 06/30/2020   K 4.0 06/30/2020   CL 113 (H) 06/30/2020   CO2 24 06/30/2020   BUN 28 (H) 06/30/2020   CREATININE 0.98  06/30/2020   GLUCOSE 120 (H) 06/30/2020   GFRNONAA 56 (L) 06/30/2020   GFRAA 36 (L) 01/29/2020   CALCIUM 8.8 (L) 06/30/2020   PROT 6.3 (L) 06/29/2020   ALBUMIN 2.7 (L) 06/29/2020   BILITOT 0.7 06/29/2020   ALKPHOS 68 06/29/2020   AST 26 06/29/2020   ALT 13 06/29/2020   ANIONGAP 10 06/30/2020    CBG (last 3)  Recent Labs    06/29/20 2357 06/30/20 0625 06/30/20 0803  GLUCAP 89 103* 106*     GFR: Estimated Creatinine Clearance: 42.5 mL/min (by C-G formula based on SCr of 0.98 mg/dL).  Coagulation Profile: No results for input(s): INR, PROTIME in the last 168 hours.  Recent Results (from the past 240 hour(s))  Blood culture (routine x 2)     Status: None (Preliminary result)   Collection Time: 06/25/20 12:55 PM   Specimen: BLOOD  Result Value Ref Range Status   Specimen Description   Final    BLOOD LEFT ANTECUBITAL Performed at [facility] [address] [place], [place] [address]    Special Requests   Final    BOTTLES DRAWN AEROBIC AND ANAEROBIC Blood Culture adequate volume Performed at [facility] [address] [place], [place] [address]    Culture   Final    NO GROWTH 3 DAYS Performed at [facility], [address] [place], [place] [address]    Report Status PENDING  Incomplete  Blood culture (routine x 2)     Status: Abnormal   Collection Time: 06/25/20 12:55 PM   Specimen: BLOOD LEFT WRIST  Result Value Ref Range Status   Specimen Description   Final    BLOOD LEFT WRIST Performed at [facility], [address], [place], [place] [address]    Special Requests   Final    BOTTLES DRAWN AEROBIC ONLY Blood Culture results may not be optimal due to an inadequate volume of blood received in culture bottles Performed at [facility] [address], [place], [place] [address]    Culture  Setup Time   Final    GRAM POSITIVE COCCI IN CLUSTERS AEROBIC BOTTLE ONLY CRITICAL RESULT CALLED  TO, READ BACK BY AND VERIFIED WITH: Guadlupe Spanish PharmD 10:00 06/26/20 ([email or web address])    Culture (A)  Final    STAPHYLOCOCCUS WARNERI THE SIGNIFICANCE OF ISOLATING THIS ORGANISM FROM A SINGLE SET OF BLOOD CULTURES WHEN MULTIPLE SETS ARE DRAWN IS UNCERTAIN. PLEASE NOTIFY THE MICROBIOLOGY DEPARTMENT WITHIN ONE WEEK IF SPECIATION AND SENSITIVITIES ARE REQUIRED. Performed at [facility], [place] [address] [place], [place] [address]    Report Status 06/27/2020 FINAL  Final  Blood Culture ID Panel (Reflexed)     Status: Abnormal   Collection Time: 06/25/20 12:55 PM  Result Value Ref Range Status   Enterococcus faecalis NOT DETECTED NOT DETECTED Final   Enterococcus Faecium NOT DETECTED NOT DETECTED Final   Listeria monocytogenes NOT DETECTED NOT DETECTED Final   Staphylococcus species DETECTED (A) NOT DETECTED Final    Comment: CRITICAL RESULT CALLED TO, READ BACK BY AND VERIFIED WITH: Guadlupe Spanish PharmD 10:00 06/26/20 ([email or web address])    Staphylococcus aureus (BCID) NOT DETECTED NOT DETECTED Final   Staphylococcus epidermidis NOT DETECTED NOT DETECTED Final   Staphylococcus lugdunensis NOT DETECTED NOT DETECTED Final   Streptococcus species NOT DETECTED NOT DETECTED Final   Streptococcus agalactiae NOT DETECTED NOT DETECTED Final   Streptococcus pneumoniae NOT DETECTED NOT DETECTED Final   Streptococcus pyogenes NOT DETECTED NOT DETECTED Final   A.calcoaceticus-baumannii NOT DETECTED NOT DETECTED Final   Bacteroides fragilis NOT DETECTED NOT DETECTED Final   Enterobacterales NOT DETECTED NOT DETECTED Final   Enterobacter cloacae complex NOT DETECTED NOT DETECTED Final   Escherichia coli NOT DETECTED NOT DETECTED Final   Klebsiella aerogenes NOT DETECTED NOT DETECTED Final   Klebsiella oxytoca NOT DETECTED NOT DETECTED Final   Klebsiella pneumoniae NOT DETECTED NOT DETECTED Final   Proteus species NOT DETECTED NOT DETECTED Final   Salmonella species NOT DETECTED NOT DETECTED Final   Serratia  marcescens NOT DETECTED NOT DETECTED Final   Haemophilus influenzae NOT DETECTED NOT DETECTED Final   Neisseria meningitidis NOT DETECTED NOT DETECTED Final   Pseudomonas aeruginosa NOT DETECTED NOT DETECTED Final   Stenotrophomonas maltophilia NOT DETECTED NOT DETECTED Final   Candida albicans NOT DETECTED NOT DETECTED Final   Candida auris NOT DETECTED NOT DETECTED Final   Candida glabrata NOT DETECTED NOT DETECTED Final   Candida krusei NOT DETECTED NOT DETECTED Final   Candida parapsilosis NOT DETECTED NOT DETECTED Final   Candida tropicalis NOT DETECTED NOT DETECTED Final   Cryptococcus neoformans/gattii NOT DETECTED NOT DETECTED Final    Comment: Performed at [facility], [address], [place], [place] [address]  Resp Panel by RT-PCR (Flu A&B, Covid) Nasopharyngeal Swab     Status: None  Collection Time: 06/25/20 12:59 PM   Specimen: Nasopharyngeal Swab; Nasopharyngeal(NP) swabs in vial transport medium  Result Value Ref Range Status   SARS Coronavirus 2 by RT PCR NEGATIVE NEGATIVE Final    Comment: (NOTE) SARS-CoV-2 target nucleic acids are NOT DETECTED.  The SARS-CoV-2 RNA is generally detectable in upper respiratory specimens during the acute phase of infection. The lowest concentration of SARS-CoV-2 viral copies this assay can detect is 138 copies/mL. A negative result does not preclude SARS-Cov-2 infection and should not be used as the sole basis for treatment or other patient management decisions. A negative result may occur with  improper specimen collection/handling, submission of specimen other than nasopharyngeal swab, presence of viral mutation(s) within the areas targeted by this assay, and inadequate number of viral copies(<138 copies/mL). A negative result must be combined with clinical observations, patient history, and epidemiological information. The expected result is Negative.  Fact Sheet for Patients:   BloggerCourse.comhttps://www.fda.gov/media/152166/download  Fact Sheet for Healthcare Providers:  SeriousBroker.ithttps://www.fda.gov/media/152162/download  This test is no t yet approved or cleared by the [place] FDA and  has been authorized for detection and/or diagnosis of SARS-CoV-2 by FDA under an Emergency Use Authorization (EUA). This EUA will remain  in effect (meaning this test can be used) for the duration of the COVID-19 declaration under Section 564(b)(1) of the Act, 21 U.S.C.section 360bbb-3(b)(1), unless the authorization is terminated  or revoked sooner.       Influenza A by PCR NEGATIVE NEGATIVE Final   Influenza B by PCR NEGATIVE NEGATIVE Final    Comment: (NOTE) The Xpert Xpress SARS-CoV-2/FLU/RSV plus assay is intended as an aid in the diagnosis of influenza from Nasopharyngeal swab specimens and should not be used as a sole basis for treatment. Nasal washings and aspirates are unacceptable for Xpert Xpress SARS-CoV-2/FLU/RSV testing.  Fact Sheet for Patients: BloggerCourse.comhttps://www.fda.gov/media/152166/download  Fact Sheet for Healthcare Providers: SeriousBroker.ithttps://www.fda.gov/media/152162/download  This test is not yet approved or cleared by the [place] FDA and has been authorized for detection and/or diagnosis of SARS-CoV-2 by FDA under an Emergency Use Authorization (EUA). This EUA will remain in effect (meaning this test can be used) for the duration of the COVID-19 declaration under Section 564(b)(1) of the Act, 21 U.S.C. section 360bbb-3(b)(1), unless the authorization is terminated or revoked.  Performed at [facility], [address] [place], [place] [address]   Urine culture     Status: Abnormal   Collection Time: 06/25/20  4:30 PM   Specimen: Urine, Random  Result Value Ref Range Status   Specimen Description   Final    URINE, RANDOM Performed at [facility], [address] [place], [place] [address]    Special Requests   Final     NONE Performed at [facility], [address] [place], [place] [address]    Culture >=100,000 COLONIES/mL ESCHERICHIA COLI (A)  Final   Report Status 06/27/2020 FINAL  Final   Organism ID, Bacteria ESCHERICHIA COLI (A)  Final      Susceptibility   Escherichia coli - MIC*    AMPICILLIN 4 SENSITIVE Sensitive     CEFAZOLIN <=4 SENSITIVE Sensitive     CEFEPIME <=0.12 SENSITIVE Sensitive     CEFTRIAXONE <=0.25 SENSITIVE Sensitive     CIPROFLOXACIN <=0.25 SENSITIVE Sensitive     GENTAMICIN <=1 SENSITIVE Sensitive     IMIPENEM <=0.25 SENSITIVE Sensitive     NITROFURANTOIN <=16 SENSITIVE Sensitive     TRIMETH/SULFA <=20 SENSITIVE Sensitive  AMPICILLIN/SULBACTAM <=2 SENSITIVE Sensitive     PIP/TAZO <=4 SENSITIVE Sensitive     * >=100,000 COLONIES/mL ESCHERICHIA COLI  MRSA PCR Screening     Status: Abnormal   Collection Time: 06/26/20 12:53 AM   Specimen: Nasal Mucosa; Nasopharyngeal  Result Value Ref Range Status   MRSA by PCR POSITIVE (A) NEGATIVE Final    Comment:        The GeneXpert MRSA Assay (FDA approved for NASAL specimens only), is one component of a comprehensive MRSA colonization surveillance program. It is not intended to diagnose MRSA infection nor to guide or monitor treatment for MRSA infections. RESULT CALLED TO, READ BACK BY AND VERIFIED WITH: RIBINSON K. 12.04.22 @ 0210 BY MECIAL J. Performed at [facility], [place] [address] [place], [place] [address]         Radiology Studies: DG CHEST PORT 1 VIEW  Result Date: 06/29/2020 CLINICAL DATA:  Hypoxia EXAM: PORTABLE CHEST 1 VIEW COMPARISON:  Four days ago FINDINGS: Cardiomegaly. Transcatheter aortic valve replacement. Chronic interstitial coarsening. There is no edema, consolidation, effusion, or pneumothorax. IMPRESSION: 1. Atelectatic type opacities at the bases. 2. Cardiomegaly. 3. Stable from prior. Electronically Signed   By: Monte Fantasia M.D.   On: 06/29/2020 10:49         Scheduled Meds: . atorvastatin  20 mg Oral Q supper  . carbidopa-levodopa  1 tablet Oral QHS  . collagenase   Topical Daily  . enoxaparin (LOVENOX) injection  40 mg Subcutaneous Q24H  . levothyroxine  50 mcg Oral Q0600  . mupirocin ointment  1 application Nasal BID   Continuous Infusions: . sodium chloride 50 mL/hr at 06/29/20 1735  . cefTRIAXone (ROCEPHIN)  IV Stopped (06/29/20 2134)     LOS: 4 days     Cordelia Poche, MD [facility] 06/30/2020, 10:03 AM  If 7PM-7AM, please contact night-coverage [email or web address]

## 2020-07-01 LAB — GLUCOSE, CAPILLARY
Glucose-Capillary: 122 mg/dL — ABNORMAL HIGH (ref 70–99)
Glucose-Capillary: 164 mg/dL — ABNORMAL HIGH (ref 70–99)
Glucose-Capillary: 105 mg/dL — ABNORMAL HIGH (ref 70–99)
Glucose-Capillary: 139 mg/dL — ABNORMAL HIGH (ref 70–99)

## 2020-07-01 LAB — SODIUM: Sodium: 140 mmol/L (ref 135–145)

## 2020-07-01 NOTE — Progress Notes (Signed)
PROGRESS NOTE    Betty Chan  IEP:[id number] DOB: August 30, 1929 DOA: 06/25/2020 PCP: Maurice Small, MD   Brief Narrative: Betty Chan is a 87 y.o. female with a history of severe headache stenosis status post TAVR, chronic systolic heart failure, chronic hypoxia, history of breast cancer status post lumpectomy, progressive supranuclear palsy, hypertension, hyperlipidemia, hypothyroidism.  Patient presented secondary to worsening mental status with evidence of sepsis and found to have a urinary tract infection.  She has been improving with IV antibiotics.   Assessment & Plan:   Principal Problem:   Acute encephalopathy Active Problems:   Hypothyroid   Hyperlipidemia   History of breast cancer   Essential hypertension   H/O aortic valve replacement TAVR   Pressure injury of skin   DNR (do not resuscitate)   Sepsis ([place])   Acute metabolic encephalopathy Appears to be secondary to urinary tract infection.  Patient is significant improved from admission.  Appeared to be resolved, however today patient is having worsening mental status with some hallucinations. Medications reviewed and unlikely secondary to medications -Observe for now  Sepsis Present on admission.  Secondary to UTI versus infected heel ulcer.  No osteomyelitis noted on MRI.  Urine culture significant for E. Coli. She is being treated with Ceftriaxone. Recurrent fever overnight on 1/6. No leukocytosis. Repeat cultures with no growth to date.  UTI E. coli.  Resistant to amoxicillin.  Patient is currently on ceftriaxone. -Continue ceftriaxone IV  Right foot ulcer Left heel.  CT negative for osteomyelitis.  Possible infection.  Will manage with antibiotics as mentioned above. -Continue Ceftriaxone IV as mentioned above  AKI Baseline creatinine appears to be around 1.1.  Creatinine of 1.42 on admission with improvement and resolution of AKI with IV fluids.  Dysphagia In setting of underlying Parkinson  disease in addition to metabolic encephalopathy.  Speech is reevaluated and is recommending dysphagia diet with nectar thick liquids. History of silent aspiration. -SLP recommendations: Dysphagia 1 diet, nectar thickened liquids  Positive blood cultures Only one set of cultures. Culture significant for staphylococcus warneri. Likely contaminant. Started on Vancomycin IV on 1/4 which is discontinued. Repeat blood cultures with no growth.  Severe aortic stenosis Patient status post TAVR.  Complicated by history of systolic heart failure.  Hypernatremia Mild -Continue D5 water @ 67ml/hr, repeat sodium  Acute metabolic encephalopathy Unknown etiology. Patient tends to become confused/encephalopathic in the mornings. Associated fever overnight. Aspiration risk as well. Chest x-ray without clear evidence of aspiration event. She has some mild hypernatremia with associated hypochloridemia. Ammonia obtained and is normal.  -Monitor -Adjust IV fluids   Chronic systolic heart failure Patient appears to be euvolemic.  Patient is on lisinopril 5 mg, metoprolol tartrate 50 mg twice daily, Lasix 20 mg daily.  Blood pressure currently on softer side.  Medications held secondary to poor mental status on admission. -Continue home metoprolol at lower dose of 25 mg twice daily -Hold lisinopril and Lasix secondary to blood pressure and AKI  Bradycardia In setting of metoprolol use. Could also be contributing to mental status issues. Improved. -Continue metoprolol XL 12.5 mg daily  Progressive supranuclear palsy Parkinson disease -Continue Sinemet  Essential hypertension -Metoprolol as mentioned above -Lisinopril/Lasix as mentioned above  Hypothyroidism -Continue Synthroid 50 micrograms  Breast cancer Patient is status post lumpectomy.  Pressure injury Left heel, POA   DVT prophylaxis: Lovenox Code Status:   Code Status: DNR Family Communication: Daughter on telephone Disposition Plan:  Discharge back to facility likely in 48 hours pending  ability to take oral nutrition in addition to stable mental status and resolution of fevers and results of repeat cultures   Consultants:   None  Procedures:   None  Antimicrobials:  Ceftriaxone  Vancomycin    Subjective: No issues  Objective: Vitals:   06/30/20 0417 06/30/20 1428 06/30/20 2041 07/01/20 0536  BP: 115/68 129/72 116/82 (!) 124/54  Pulse: 77 71 69 72  Resp: 14 14 14    Temp: (!) 97.5 F (36.4 C) 98 F (36.7 C) 98.3 F (36.8 C) 98.9 F (37.2 C)  TempSrc: Oral Oral Oral Oral  SpO2: 96% 99% 99% 97%  Weight:      Height:        Intake/Output Summary (Last 24 hours) at 07/01/2020 1058 Last data filed at 07/01/2020 1005 Gross per 24 hour  Intake 472 ml  Output 900 ml  Net -428 ml   Filed Weights   06/26/20 1000  Weight: 77.1 kg    Examination:  General exam: Appears calm and comfortable Respiratory system: Clear to auscultation. Respiratory effort normal. Cardiovascular system: S1 & S2 heard, RRR. No murmurs, rubs, gallops or clicks. Gastrointestinal system: Abdomen is nondistended, soft and nontender. No organomegaly or masses felt. Normal bowel sounds heard. Central nervous system: Alert. No focal neurological deficits. Musculoskeletal:  No calf tenderness Skin: No cyanosis. No rashes  Data Reviewed: I have personally reviewed following labs and imaging studies  CBC Lab Results  Component Value Date   WBC 6.6 06/29/2020   RBC 3.35 (L) 06/29/2020   HGB 11.1 (L) 06/29/2020   HCT 35.5 (L) 06/29/2020   MCV 106.0 (H) 06/29/2020   MCH 33.1 06/29/2020   PLT 195 06/29/2020   MCHC 31.3 06/29/2020   RDW 14.5 06/29/2020   LYMPHSABS 0.9 06/27/2020   MONOABS 0.8 06/27/2020   EOSABS 0.1 06/27/2020   BASOSABS 0.0 123456     Last metabolic panel Lab Results  Component Value Date   NA 147 (H) 06/30/2020   K 4.0 06/30/2020   CL 113 (H) 06/30/2020   CO2 24 06/30/2020   BUN 28 (H)  06/30/2020   CREATININE 0.98 06/30/2020   GLUCOSE 120 (H) 06/30/2020   GFRNONAA 56 (L) 06/30/2020   GFRAA 36 (L) 01/29/2020   CALCIUM 8.8 (L) 06/30/2020   PROT 6.3 (L) 06/29/2020   ALBUMIN 2.7 (L) 06/29/2020   BILITOT 0.7 06/29/2020   ALKPHOS 68 06/29/2020   AST 26 06/29/2020   ALT 13 06/29/2020   ANIONGAP 10 06/30/2020    CBG (last 3)  Recent Labs    06/30/20 1717 06/30/20 2348 07/01/20 0540  GLUCAP 107* 111* 122*     GFR: Estimated Creatinine Clearance: 42.5 mL/min (by C-G formula based on SCr of 0.98 mg/dL).  Coagulation Profile: No results for input(s): INR, PROTIME in the last 168 hours.  Recent Results (from the past 240 hour(s))  Blood culture (routine x 2)     Status: None   Collection Time: 06/25/20 12:55 PM   Specimen: BLOOD  Result Value Ref Range Status   Specimen Description   Final    BLOOD LEFT ANTECUBITAL Performed at [facility] [address] [place], [place] [address]    Special Requests   Final    BOTTLES DRAWN AEROBIC AND ANAEROBIC Blood Culture adequate volume Performed at [facility] [address] [place], [place] [address]    Culture   Final    NO GROWTH 5 DAYS Performed at [facility], [address] [place], [place] [address]    Report Status 06/30/2020 FINAL  Final  Blood culture (routine x 2)     Status: Abnormal   Collection Time: 06/25/20 12:55 PM   Specimen: BLOOD LEFT WRIST  Result Value Ref Range Status   Specimen Description   Final    BLOOD LEFT WRIST Performed at [facility] [address], [place], [place] [address]    Special Requests   Final    BOTTLES DRAWN AEROBIC ONLY Blood Culture results may not be optimal due to an inadequate volume of blood received in culture bottles Performed at [facility] [address], [place], [place] [address]    Culture  Setup Time   Final    GRAM POSITIVE COCCI IN CLUSTERS AEROBIC BOTTLE ONLY CRITICAL  RESULT CALLED TO, READ BACK BY AND VERIFIED WITH: Guadlupe Spanish PharmD 10:00 06/26/20 ([email or web address])    Culture (A)  Final    STAPHYLOCOCCUS WARNERI THE SIGNIFICANCE OF ISOLATING THIS ORGANISM FROM A SINGLE SET OF BLOOD CULTURES WHEN MULTIPLE SETS ARE DRAWN IS UNCERTAIN. PLEASE NOTIFY THE MICROBIOLOGY DEPARTMENT WITHIN ONE WEEK IF SPECIATION AND SENSITIVITIES ARE REQUIRED. Performed at [facility], [place] [address], [place] [employer] [place] [employer], [place] [address]    Report Status 06/27/2020 FINAL  Final  Blood Culture ID Panel (Reflexed)     Status: Abnormal   Collection Time: 06/25/20 12:55 PM  Result Value Ref Range Status   Enterococcus faecalis NOT DETECTED NOT DETECTED Final   Enterococcus Faecium NOT DETECTED NOT DETECTED Final   Listeria monocytogenes NOT DETECTED NOT DETECTED Final   Staphylococcus species DETECTED (A) NOT DETECTED Final    Comment: CRITICAL RESULT CALLED TO, READ BACK BY AND VERIFIED WITH: Guadlupe Spanish PharmD 10:00 06/26/20 ([email or web address])    Staphylococcus aureus (BCID) NOT DETECTED NOT DETECTED Final   Staphylococcus epidermidis NOT DETECTED NOT DETECTED Final   Staphylococcus lugdunensis NOT DETECTED NOT DETECTED Final   Streptococcus species NOT DETECTED NOT DETECTED Final   Streptococcus agalactiae NOT DETECTED NOT DETECTED Final   Streptococcus pneumoniae NOT DETECTED NOT DETECTED Final   Streptococcus pyogenes NOT DETECTED NOT DETECTED Final   A.calcoaceticus-baumannii NOT DETECTED NOT DETECTED Final   Bacteroides fragilis NOT DETECTED NOT DETECTED Final   Enterobacterales NOT DETECTED NOT DETECTED Final   Enterobacter cloacae complex NOT DETECTED NOT DETECTED Final   Escherichia coli NOT DETECTED NOT DETECTED Final   Klebsiella aerogenes NOT DETECTED NOT DETECTED Final   Klebsiella oxytoca NOT DETECTED NOT DETECTED Final   Klebsiella pneumoniae NOT DETECTED NOT DETECTED Final   Proteus species NOT DETECTED NOT DETECTED Final   Salmonella species NOT DETECTED NOT DETECTED Final    Serratia marcescens NOT DETECTED NOT DETECTED Final   Haemophilus influenzae NOT DETECTED NOT DETECTED Final   Neisseria meningitidis NOT DETECTED NOT DETECTED Final   Pseudomonas aeruginosa NOT DETECTED NOT DETECTED Final   Stenotrophomonas maltophilia NOT DETECTED NOT DETECTED Final   Candida albicans NOT DETECTED NOT DETECTED Final   Candida auris NOT DETECTED NOT DETECTED Final   Candida glabrata NOT DETECTED NOT DETECTED Final   Candida krusei NOT DETECTED NOT DETECTED Final   Candida parapsilosis NOT DETECTED NOT DETECTED Final   Candida tropicalis NOT DETECTED NOT DETECTED Final   Cryptococcus neoformans/gattii NOT DETECTED NOT DETECTED Final    Comment: Performed at [facility], [address], [place], [place] [address]  Resp Panel by RT-PCR (Flu A&B, Covid) Nasopharyngeal Swab     Status:  None   Collection Time: 06/25/20 12:59 PM   Specimen: Nasopharyngeal Swab; Nasopharyngeal(NP) swabs in vial transport medium  Result Value Ref Range Status   SARS Coronavirus 2 by RT PCR NEGATIVE NEGATIVE Final    Comment: (NOTE) SARS-CoV-2 target nucleic acids are NOT DETECTED.  The SARS-CoV-2 RNA is generally detectable in upper respiratory specimens during the acute phase of infection. The lowest concentration of SARS-CoV-2 viral copies this assay can detect is 138 copies/mL. A negative result does not preclude SARS-Cov-2 infection and should not be used as the sole basis for treatment or other patient management decisions. A negative result may occur with  improper specimen collection/handling, submission of specimen other than nasopharyngeal swab, presence of viral mutation(s) within the areas targeted by this assay, and inadequate number of viral copies(<138 copies/mL). A negative result must be combined with clinical observations, patient history, and epidemiological information. The expected result is Negative.  Fact Sheet for Patients:   EntrepreneurPulse.com.au  Fact Sheet for Healthcare Providers:  IncredibleEmployment.be  This test is no t yet approved or cleared by the [place] FDA and  has been authorized for detection and/or diagnosis of SARS-CoV-2 by FDA under an Emergency Use Authorization (EUA). This EUA will remain  in effect (meaning this test can be used) for the duration of the COVID-19 declaration under Section 564(b)(1) of the Act, 21 U.S.C.section 360bbb-3(b)(1), unless the authorization is terminated  or revoked sooner.       Influenza A by PCR NEGATIVE NEGATIVE Final   Influenza B by PCR NEGATIVE NEGATIVE Final    Comment: (NOTE) The Xpert Xpress SARS-CoV-2/FLU/RSV plus assay is intended as an aid in the diagnosis of influenza from Nasopharyngeal swab specimens and should not be used as a sole basis for treatment. Nasal washings and aspirates are unacceptable for Xpert Xpress SARS-CoV-2/FLU/RSV testing.  Fact Sheet for Patients: EntrepreneurPulse.com.au  Fact Sheet for Healthcare Providers: IncredibleEmployment.be  This test is not yet approved or cleared by the [place] FDA and has been authorized for detection and/or diagnosis of SARS-CoV-2 by FDA under an Emergency Use Authorization (EUA). This EUA will remain in effect (meaning this test can be used) for the duration of the COVID-19 declaration under Section 564(b)(1) of the Act, 21 U.S.C. section 360bbb-3(b)(1), unless the authorization is terminated or revoked.  Performed at [facility], [place] [address], [place], [place] [address]   Urine culture     Status: Abnormal   Collection Time: 06/25/20  4:30 PM   Specimen: Urine, Random  Result Value Ref Range Status   Specimen Description   Final    URINE, RANDOM Performed at [facility] [address] [place], [place] [address]    Special Requests   Final     NONE Performed at [facility], [place] [address], [place], [place] [address]    Culture >=100,000 COLONIES/mL ESCHERICHIA COLI (A)  Final   Report Status 06/27/2020 FINAL  Final   Organism ID, Bacteria ESCHERICHIA COLI (A)  Final      Susceptibility   Escherichia coli - MIC*    AMPICILLIN 4 SENSITIVE Sensitive     CEFAZOLIN <=4 SENSITIVE Sensitive     CEFEPIME <=0.12 SENSITIVE Sensitive     CEFTRIAXONE <=0.25 SENSITIVE Sensitive     CIPROFLOXACIN <=0.25 SENSITIVE Sensitive     GENTAMICIN <=1 SENSITIVE Sensitive     IMIPENEM <=0.25 SENSITIVE Sensitive     NITROFURANTOIN <=16 SENSITIVE Sensitive     TRIMETH/SULFA <=20 SENSITIVE  Sensitive     AMPICILLIN/SULBACTAM <=2 SENSITIVE Sensitive     PIP/TAZO <=4 SENSITIVE Sensitive     * >=100,000 COLONIES/mL ESCHERICHIA COLI  MRSA PCR Screening     Status: Abnormal   Collection Time: 06/26/20 12:53 AM   Specimen: Nasal Mucosa; Nasopharyngeal  Result Value Ref Range Status   MRSA by PCR POSITIVE (A) NEGATIVE Final    Comment:        The GeneXpert MRSA Assay (FDA approved for NASAL specimens only), is one component of a comprehensive MRSA colonization surveillance program. It is not intended to diagnose MRSA infection nor to guide or monitor treatment for MRSA infections. RESULT CALLED TO, READ BACK BY AND VERIFIED WITH: RIBINSON K. 12.04.22 @ 0210 BY MECIAL J. Performed at [facility], [place] [address], [place], [place] [address]   Culture, blood (routine x 2)     Status: None (Preliminary result)   Collection Time: 06/29/20  9:38 AM   Specimen: BLOOD LEFT HAND  Result Value Ref Range Status   Specimen Description   Final    BLOOD LEFT HAND Performed at [facility] [address] [place], [place] [address]    Special Requests   Final    BOTTLES DRAWN AEROBIC AND ANAEROBIC Blood Culture adequate volume Performed at [facility] [address],  [place], [place] [address]    Culture   Final    NO GROWTH 1 DAY Performed at [facility], [place] [address], [place], [place] [address]    Report Status PENDING  Incomplete  Culture, blood (routine x 2)     Status: None (Preliminary result)   Collection Time: 06/29/20  9:42 AM   Specimen: BLOOD LEFT HAND  Result Value Ref Range Status   Specimen Description   Final    BLOOD LEFT HAND Performed at [facility] [address], [place], [place] [address]    Special Requests   Final    BOTTLES DRAWN AEROBIC ONLY Blood Culture adequate volume Performed at [facility] [address], [place], [place] [address]    Culture   Final    NO GROWTH 1 DAY Performed at [facility], [place] [address], [place], [place] [address]    Report Status PENDING  Incomplete  Culture, Urine     Status: None   Collection Time: 06/29/20  2:58 PM   Specimen: Urine, Random  Result Value Ref Range Status   Specimen Description   Final    URINE, RANDOM Performed at [facility] [address] [place], [place] [address]    Special Requests   Final    NONE Performed at [facility], [place] [address] [place], [place] [address]    Culture   Final    NO GROWTH Performed at [facility], [place] [address], [place], [place] [address]    Report Status 06/30/2020 FINAL  Final        Radiology Studies: No results found.      Scheduled Meds: . atorvastatin  20 mg Oral Q supper  . carbidopa-levodopa  1 tablet Oral QHS  . collagenase   Topical Daily  . enoxaparin (LOVENOX) injection  40 mg Subcutaneous Q24H  . levothyroxine  50 mcg Oral Q0600  . metoprolol succinate  12.5 mg Oral Daily   Continuous Infusions: . cefTRIAXone (ROCEPHIN)  IV Stopped (06/30/20 2040)  . dextrose 50 mL/hr at 07/01/20 [phone]  LOS: 5 days     Cordelia Poche, MD [facility] 07/01/2020, 10:58 AM  If 7PM-7AM, please contact  night-coverage [email or web address]

## 2020-07-02 LAB — GLUCOSE, CAPILLARY
Glucose-Capillary: 126 mg/dL — ABNORMAL HIGH (ref 70–99)
Glucose-Capillary: 107 mg/dL — ABNORMAL HIGH (ref 70–99)
Glucose-Capillary: 171 mg/dL — ABNORMAL HIGH (ref 70–99)

## 2020-07-02 LAB — SARS CORONAVIRUS 2 (TAT 6-24 HRS): SARS Coronavirus 2: NEGATIVE

## 2020-07-02 NOTE — Progress Notes (Signed)
PROGRESS NOTE    Betty Chan  OIZ:[id number] DOB: June 20, 1930 DOA: 06/25/2020 PCP: Shirlean Mylar, MD   Brief Narrative: Betty Chan is a 87 y.o. female with a history of severe headache stenosis status post TAVR, chronic systolic heart failure, chronic hypoxia, history of breast cancer status post lumpectomy, progressive supranuclear palsy, hypertension, hyperlipidemia, hypothyroidism.  Patient presented secondary to worsening mental status with evidence of sepsis and found to have a urinary tract infection.  She has been improving with IV antibiotics.   Assessment & Plan:   Principal Problem:   Acute encephalopathy Active Problems:   Hypothyroid   Hyperlipidemia   History of breast cancer   Essential hypertension   H/O aortic valve replacement TAVR   Pressure injury of skin   DNR (do not resuscitate)   Sepsis (HCC)   Acute metabolic encephalopathy Appears to be secondary to urinary tract infection.  Patient is significant improved from admission.  Appeared to be resolved, however today patient is having worsening mental status with some hallucinations. Medications reviewed and unlikely secondary to medications. Seems to be resolved.  Sepsis Present on admission.  Secondary to UTI versus infected heel ulcer.  No osteomyelitis noted on MRI.  Urine culture significant for E. Coli. She is being treated with Ceftriaxone. Recurrent fever overnight on 1/6. No leukocytosis. Repeat cultures with no growth to date.  UTI E. coli.  Resistant to amoxicillin.  Patient is currently on ceftriaxone and has completed course.  Right foot ulcer Left heel.  CT negative for osteomyelitis.  Possible infection.  Will manage with antibiotics as mentioned above. Completed 7 days.  AKI Baseline creatinine appears to be around 1.1.  Creatinine of 1.42 on admission with improvement and resolution of AKI with IV fluids.  Dysphagia In setting of underlying Parkinson disease in addition to  metabolic encephalopathy.  Speech is reevaluated and is recommending dysphagia diet with nectar thick liquids. History of silent aspiration. -SLP recommendations: Dysphagia 1 diet, nectar thickened liquids  Positive blood cultures Only one set of cultures. Culture significant for staphylococcus warneri. Likely contaminant. Started on Vancomycin IV on 1/4 which is discontinued. Repeat blood cultures with no growth.  Severe aortic stenosis Patient status post TAVR.  Complicated by history of systolic heart failure.  Hypernatremia Mild and resolved. Discontinue IV fluids.  Chronic systolic heart failure Patient appears to be euvolemic.  Patient is on lisinopril 5 mg, metoprolol tartrate 50 mg twice daily, Lasix 20 mg daily.  Blood pressure currently on softer side.  Medications held secondary to poor mental status on admission. -Continue home metoprolol at lower dose of 25 mg twice daily -Hold lisinopril and Lasix secondary to blood pressure and AKI  Bradycardia In setting of metoprolol use. Could also be contributing to mental status issues. Improved with decreased metoprolol dose. -Continue metoprolol XL 12.5 mg daily  Progressive supranuclear palsy Parkinson disease -Continue Sinemet  Essential hypertension -Metoprolol as mentioned above -Lisinopril/Lasix as mentioned above  Hypothyroidism -Continue Synthroid 50 micrograms  Breast cancer Patient is status post lumpectomy.  Pressure injury Left heel, POA   DVT prophylaxis: Lovenox Code Status:   Code Status: DNR Family Communication: None at bedside Disposition Plan: Discharge back to facility likely in 24 hours pending possible SNF discharge.   Consultants:   None  Procedures:   None  Antimicrobials:  Ceftriaxone  Vancomycin    Subjective: No concerns today.  Objective: Vitals:   07/01/20 0536 07/01/20 1350 07/01/20 2048 07/02/20 0626  BP: (!) 124/54 124/64 119/63 105/61  Pulse: 72 78 76 65  Resp:   14 16 16   Temp: 98.9 F (37.2 C) (!) 97.5 F (36.4 C) 98.6 F (37 C) 98.3 F (36.8 C)  TempSrc: Oral Oral Oral Oral  SpO2: 97% 94% 96% 96%  Weight:      Height:        Intake/Output Summary (Last 24 hours) at 07/02/2020 1322 Last data filed at 07/02/2020 0644 Gross per 24 hour  Intake 938 ml  Output 650 ml  Net 288 ml   Filed Weights   06/26/20 1000  Weight: 77.1 kg    Examination:  General exam: Appears calm and comfortable Respiratory system: Clear to auscultation. Respiratory effort normal. Cardiovascular system: S1 & S2 heard, RRR. No murmurs, rubs, gallops or clicks. Gastrointestinal system: Abdomen is nondistended, soft and nontender. No organomegaly or masses felt. Normal bowel sounds heard. Central nervous system: Alert and oriented to self Musculoskeletal: No calf tenderness Skin: No cyanosis. No rashes  Data Reviewed: I have personally reviewed following labs and imaging studies  CBC Lab Results  Component Value Date   WBC 6.6 06/29/2020   RBC 3.35 (L) 06/29/2020   HGB 11.1 (L) 06/29/2020   HCT 35.5 (L) 06/29/2020   MCV 106.0 (H) 06/29/2020   MCH 33.1 06/29/2020   PLT 195 06/29/2020   MCHC 31.3 06/29/2020   RDW 14.5 06/29/2020   LYMPHSABS 0.9 06/27/2020   MONOABS 0.8 06/27/2020   EOSABS 0.1 06/27/2020   BASOSABS 0.0 123456     Last metabolic panel Lab Results  Component Value Date   NA 140 07/01/2020   K 4.0 06/30/2020   CL 113 (H) 06/30/2020   CO2 24 06/30/2020   BUN 28 (H) 06/30/2020   CREATININE 0.98 06/30/2020   GLUCOSE 120 (H) 06/30/2020   GFRNONAA 56 (L) 06/30/2020   GFRAA 36 (L) 01/29/2020   CALCIUM 8.8 (L) 06/30/2020   PROT 6.3 (L) 06/29/2020   ALBUMIN 2.7 (L) 06/29/2020   BILITOT 0.7 06/29/2020   ALKPHOS 68 06/29/2020   AST 26 06/29/2020   ALT 13 06/29/2020   ANIONGAP 10 06/30/2020    CBG (last 3)  Recent Labs    07/01/20 2253 07/02/20 0528 07/02/20 1243  GLUCAP 139* 126* 107*     GFR: Estimated Creatinine  Clearance: 42.5 mL/min (by C-G formula based on SCr of 0.98 mg/dL).  Coagulation Profile: No results for input(s): INR, PROTIME in the last 168 hours.  Recent Results (from the past 240 hour(s))  Blood culture (routine x 2)     Status: None   Collection Time: 06/25/20 12:55 PM   Specimen: BLOOD  Result Value Ref Range Status   Specimen Description   Final    BLOOD LEFT ANTECUBITAL Performed at [facility] [address], [place], [place] [address]    Special Requests   Final    BOTTLES DRAWN AEROBIC AND ANAEROBIC Blood Culture adequate volume Performed at [facility] [address] [place], [place] [address]    Culture   Final    NO GROWTH 5 DAYS Performed at [facility], [place] [address], [place], [place] [address]    Report Status 06/30/2020 FINAL  Final  Blood culture (routine x 2)     Status: Abnormal   Collection Time: 06/25/20 12:55 PM   Specimen: BLOOD LEFT WRIST  Result Value Ref Range Status   Specimen Description   Final    BLOOD LEFT WRIST Performed at [facility],  [address], [place], [place] [address]    Special Requests   Final    BOTTLES DRAWN AEROBIC ONLY Blood Culture results may not be optimal due to an inadequate volume of blood received in culture bottles Performed at [facility] [address] [place], [place] [address]    Culture  Setup Time   Final    GRAM POSITIVE COCCI IN CLUSTERS AEROBIC BOTTLE ONLY CRITICAL RESULT CALLED TO, READ BACK BY AND VERIFIED WITH: Guadlupe Spanish PharmD 10:00 06/26/20 ([email or web address])    Culture (A)  Final    STAPHYLOCOCCUS WARNERI THE SIGNIFICANCE OF ISOLATING THIS ORGANISM FROM A SINGLE SET OF BLOOD CULTURES WHEN MULTIPLE SETS ARE DRAWN IS UNCERTAIN. PLEASE NOTIFY THE MICROBIOLOGY DEPARTMENT WITHIN ONE WEEK IF SPECIATION AND SENSITIVITIES ARE REQUIRED. Performed at [facility], [place] [address], [place], [place] [address]    Report Status  06/27/2020 FINAL  Final  Blood Culture ID Panel (Reflexed)     Status: Abnormal   Collection Time: 06/25/20 12:55 PM  Result Value Ref Range Status   Enterococcus faecalis NOT DETECTED NOT DETECTED Final   Enterococcus Faecium NOT DETECTED NOT DETECTED Final   Listeria monocytogenes NOT DETECTED NOT DETECTED Final   Staphylococcus species DETECTED (A) NOT DETECTED Final    Comment: CRITICAL RESULT CALLED TO, READ BACK BY AND VERIFIED WITH: Guadlupe Spanish PharmD 10:00 06/26/20 ([email or web address])    Staphylococcus aureus (BCID) NOT DETECTED NOT DETECTED Final   Staphylococcus epidermidis NOT DETECTED NOT DETECTED Final   Staphylococcus lugdunensis NOT DETECTED NOT DETECTED Final   Streptococcus species NOT DETECTED NOT DETECTED Final   Streptococcus agalactiae NOT DETECTED NOT DETECTED Final   Streptococcus pneumoniae NOT DETECTED NOT DETECTED Final   Streptococcus pyogenes NOT DETECTED NOT DETECTED Final   A.calcoaceticus-baumannii NOT DETECTED NOT DETECTED Final   Bacteroides fragilis NOT DETECTED NOT DETECTED Final   Enterobacterales NOT DETECTED NOT DETECTED Final   Enterobacter cloacae complex NOT DETECTED NOT DETECTED Final   Escherichia coli NOT DETECTED NOT DETECTED Final   Klebsiella aerogenes NOT DETECTED NOT DETECTED Final   Klebsiella oxytoca NOT DETECTED NOT DETECTED Final   Klebsiella pneumoniae NOT DETECTED NOT DETECTED Final   Proteus species NOT DETECTED NOT DETECTED Final   Salmonella species NOT DETECTED NOT DETECTED Final   Serratia marcescens NOT DETECTED NOT DETECTED Final   Haemophilus influenzae NOT DETECTED NOT DETECTED Final   Neisseria meningitidis NOT DETECTED NOT DETECTED Final   Pseudomonas aeruginosa NOT DETECTED NOT DETECTED Final   Stenotrophomonas maltophilia NOT DETECTED NOT DETECTED Final   Candida albicans NOT DETECTED NOT DETECTED Final   Candida auris NOT DETECTED NOT DETECTED Final   Candida glabrata NOT DETECTED NOT DETECTED Final   Candida krusei NOT  DETECTED NOT DETECTED Final   Candida parapsilosis NOT DETECTED NOT DETECTED Final   Candida tropicalis NOT DETECTED NOT DETECTED Final   Cryptococcus neoformans/gattii NOT DETECTED NOT DETECTED Final    Comment: Performed at [facility], [address], [place], [place] [address]  Resp Panel by RT-PCR (Flu A&B, Covid) Nasopharyngeal Swab     Status: None   Collection Time: 06/25/20 12:59 PM   Specimen: Nasopharyngeal Swab; Nasopharyngeal(NP) swabs in vial transport medium  Result Value Ref Range Status   SARS Coronavirus 2 by RT PCR NEGATIVE NEGATIVE Final    Comment: (NOTE) SARS-CoV-2 target nucleic acids are NOT DETECTED.  The SARS-CoV-2 RNA is generally detectable in upper respiratory specimens during the acute phase of infection.  The lowest concentration of SARS-CoV-2 viral copies this assay can detect is 138 copies/mL. A negative result does not preclude SARS-Cov-2 infection and should not be used as the sole basis for treatment or other patient management decisions. A negative result may occur with  improper specimen collection/handling, submission of specimen other than nasopharyngeal swab, presence of viral mutation(s) within the areas targeted by this assay, and inadequate number of viral copies(<138 copies/mL). A negative result must be combined with clinical observations, patient history, and epidemiological information. The expected result is Negative.  Fact Sheet for Patients:  EntrepreneurPulse.com.au  Fact Sheet for Healthcare Providers:  IncredibleEmployment.be  This test is no t yet approved or cleared by the [place] FDA and  has been authorized for detection and/or diagnosis of SARS-CoV-2 by FDA under an Emergency Use Authorization (EUA). This EUA will remain  in effect (meaning this test can be used) for the duration of the COVID-19 declaration under Section 564(b)(1) of the Act, 21 U.S.C.section 360bbb-3(b)(1),  unless the authorization is terminated  or revoked sooner.       Influenza A by PCR NEGATIVE NEGATIVE Final   Influenza B by PCR NEGATIVE NEGATIVE Final    Comment: (NOTE) The Xpert Xpress SARS-CoV-2/FLU/RSV plus assay is intended as an aid in the diagnosis of influenza from Nasopharyngeal swab specimens and should not be used as a sole basis for treatment. Nasal washings and aspirates are unacceptable for Xpert Xpress SARS-CoV-2/FLU/RSV testing.  Fact Sheet for Patients: EntrepreneurPulse.com.au  Fact Sheet for Healthcare Providers: IncredibleEmployment.be  This test is not yet approved or cleared by the [place] FDA and has been authorized for detection and/or diagnosis of SARS-CoV-2 by FDA under an Emergency Use Authorization (EUA). This EUA will remain in effect (meaning this test can be used) for the duration of the COVID-19 declaration under Section 564(b)(1) of the Act, 21 U.S.C. section 360bbb-3(b)(1), unless the authorization is terminated or revoked.  Performed at [facility], [place] [address] [place], [place] [address]   Urine culture     Status: Abnormal   Collection Time: 06/25/20  4:30 PM   Specimen: Urine, Random  Result Value Ref Range Status   Specimen Description   Final    URINE, RANDOM Performed at [facility] [address], [place], [place] [address]    Special Requests   Final    NONE Performed at [facility], [place] [address], [place], [place] [address]    Culture >=100,000 COLONIES/mL ESCHERICHIA COLI (A)  Final   Report Status 06/27/2020 FINAL  Final   Organism ID, Bacteria ESCHERICHIA COLI (A)  Final      Susceptibility   Escherichia coli - MIC*    AMPICILLIN 4 SENSITIVE Sensitive     CEFAZOLIN <=4 SENSITIVE Sensitive     CEFEPIME <=0.12 SENSITIVE Sensitive     CEFTRIAXONE <=0.25 SENSITIVE Sensitive     CIPROFLOXACIN <=0.25 SENSITIVE  Sensitive     GENTAMICIN <=1 SENSITIVE Sensitive     IMIPENEM <=0.25 SENSITIVE Sensitive     NITROFURANTOIN <=16 SENSITIVE Sensitive     TRIMETH/SULFA <=20 SENSITIVE Sensitive     AMPICILLIN/SULBACTAM <=2 SENSITIVE Sensitive     PIP/TAZO <=4 SENSITIVE Sensitive     * >=100,000 COLONIES/mL ESCHERICHIA COLI  MRSA PCR Screening     Status: Abnormal   Collection Time: 06/26/20 12:53 AM   Specimen: Nasal Mucosa; Nasopharyngeal  Result Value Ref Range Status   MRSA by PCR POSITIVE (A) NEGATIVE Final  Comment:        The GeneXpert MRSA Assay (FDA approved for NASAL specimens only), is one component of a comprehensive MRSA colonization surveillance program. It is not intended to diagnose MRSA infection nor to guide or monitor treatment for MRSA infections. RESULT CALLED TO, READ BACK BY AND VERIFIED WITH: RIBINSON K. 12.04.22 @ 0210 BY MECIAL J. Performed at [facility], [place] [address] [place], [place] [address]   Culture, blood (routine x 2)     Status: None (Preliminary result)   Collection Time: 06/29/20  9:38 AM   Specimen: BLOOD LEFT HAND  Result Value Ref Range Status   Specimen Description   Final    BLOOD LEFT HAND Performed at [facility] [address], [place], [place] [address]    Special Requests   Final    BOTTLES DRAWN AEROBIC AND ANAEROBIC Blood Culture adequate volume Performed at [facility] [address], [place], [place] [address]    Culture   Final    NO GROWTH 3 DAYS Performed at [facility], [place] [address], [place], [place] [address]    Report Status PENDING  Incomplete  Culture, blood (routine x 2)     Status: None (Preliminary result)   Collection Time: 06/29/20  9:42 AM   Specimen: BLOOD LEFT HAND  Result Value Ref Range Status   Specimen Description   Final    BLOOD LEFT HAND Performed at [facility] [address] [place], [place] [address]    Special  Requests   Final    BOTTLES DRAWN AEROBIC ONLY Blood Culture adequate volume Performed at [facility] [address], [place], [place] [address]    Culture   Final    NO GROWTH 3 DAYS Performed at [facility], [place] [address] [place], [place] [address]    Report Status PENDING  Incomplete  Culture, Urine     Status: None   Collection Time: 06/29/20  2:58 PM   Specimen: Urine, Random  Result Value Ref Range Status   Specimen Description   Final    URINE, RANDOM Performed at [facility] [address] [place], [place] [address]    Special Requests   Final    NONE Performed at [facility], [place] [address], [place], [place] [address]    Culture   Final    NO GROWTH Performed at [facility], [place] [address], [place],  [address]    Report Status 06/30/2020 FINAL  Final        Radiology Studies: No results found.      Scheduled Meds: . atorvastatin  20 mg Oral Q supper  . carbidopa-levodopa  1 tablet Oral QHS  . collagenase   Topical Daily  . enoxaparin (LOVENOX) injection  40 mg Subcutaneous Q24H  . levothyroxine  50 mcg Oral Q0600  . metoprolol succinate  12.5 mg Oral Daily   Continuous Infusions:    LOS: 6 days     Cordelia Poche, MD [facility] 07/02/2020, 1:22 PM  If 7PM-7AM, please contact night-coverage [email or web address]

## 2020-07-02 NOTE — Care Management Important Message (Signed)
Important Message  Patient Details IM Letter given to the Patient. Name: Betty Chan MRN: [id number] Date of Birth: February 23, 1930   Medicare Important Message Given:  Yes     Kerin Salen 07/02/2020, 11:18 AM

## 2020-07-02 NOTE — Evaluation (Signed)
Physical Therapy Evaluation Patient Details Name: Betty Chan MRN: [id number] DOB: [birth year] Today's Date: 07/02/2020   History of Present Illness  Patient is a 87 y.o. female with a history of severe headache stenosis status post TAVR, chronic systolic heart failure, chronic hypoxia, history of breast cancer status post lumpectomy, progressive supranuclear palsy, HTN, HLD, hypothyroidism TIA, seizure, OA, osteopenia, GERD. Patient presented secondary to worsening mental status with evidence of sepsis and found to have a UTI.   Clinical Impression  Betty Chan is 87 y.o. female admitted with above HPI and diagnosis. Patient is currently limited by functional impairments below (see PT problem list). Patient lives at [facility] and is requires assist for mobility and ADL's at baseline. She currently requires Max-Total assist for bed mobility and requires min-mod assist to steady balance at EOB. Per pt's daughter she was able to previously help with transfers for Bed<>Chair with use of RW. Patient will benefit from continued skilled PT interventions to address impairments and progress independence with mobility, recommending SNF follow up for therapy. Acute PT will follow and progress as able.       07/02/20 1500  PT Visit Information  Last PT Received On 07/02/20  Assistance Needed +2  History of Present Illness Patient is a 87 y.o. female with a history of severe headache stenosis status post TAVR, chronic systolic heart failure, chronic hypoxia, history of breast cancer status post lumpectomy, progressive supranuclear palsy, HTN, HLD, hypothyroidism TIA, seizure, OA, osteopenia, GERD. Patient presented secondary to worsening mental status with evidence of sepsis and found to have a UTI.  Precautions  Precautions Fall  Restrictions  Weight Bearing Restrictions No  Home Living  Family/patient expects to be discharged to: Skilled nursing facility  Prior Function  Level of  Independence Needs assistance  Gait / Transfers Assistance Needed (per chart review form 01/2020) pt requires min-mod assist to transfer to w/c and then able to perform w/c mobility  ADL's / [place] (per chart review) pt requires assist for all ADL's, ~ 1 year ago was able to don socks and bra without assist  Communication / Swallowing Assistance Needed soft voice  Comments pt reports she cannot remember the last time she stood. when asked if she gets out of bed daily she says " no.. maybe every other day". per secure chat from [place] pt's daughter reports  the pt has been wheelchair bound for awhile but was able to help with transfers, feed herself and put on her makeup.  Communication  Communication No difficulties  Pain Assessment  Pain Assessment Faces  Faces Pain Scale 0  Pain Intervention(s) Monitored during session  Cognition  Arousal/Alertness Awake/alert  Behavior During Therapy Flat affect  Overall Cognitive Status No family/caregiver present to determine baseline cognitive functioning  Upper Extremity Assessment  Upper Extremity Assessment Generalized weakness;Defer to OT evaluation  Lower Extremity Assessment  Lower Extremity Assessment Generalized weakness;RLE deficits/detail;LLE deficits/detail  RLE Deficits / Details poor strength throughout (1 to 2-/5) for strength.  LLE Deficits / Details poor strength 1/5 or less throughout.  Cervical / Trunk Assessment  Cervical / Trunk Assessment Other exceptions  Cervical / Trunk Exceptions cervical Lt torticollis contraction. able to be repositioned to improve alignment in bed.  Bed Mobility  Overal bed mobility Needs Assistance  Bed Mobility Supine to Sit;Sit to Supine  Supine to sit Total assist;+2 for physical assistance  Sit to supine Total assist;+2 for physical assistance  General bed mobility comments pt attempted to mobilize Rt  LE towards EOB but very weak and Max assist required. Total Assist with 2+ to use  bed pad to raise pt's trunk. 2+ Max/Total assist to return to supine.  Balance  Overall balance assessment Needs assistance  Sitting-balance support Feet supported;Bilateral upper extremity supported  Sitting balance-Leahy Scale Poor  Sitting balance - Comments pt able to sit without back supported for ~5-10 seconds but requires intermittent assist to stabilize and fatigues quickly requiring posterior support.  PT - End of Session  Activity Tolerance Patient tolerated treatment well  Patient left in bed;with call bell/phone within reach;with bed alarm set  Nurse Communication Mobility status  PT Assessment  PT Recommendation/Assessment Patient needs continued PT services  PT Visit Diagnosis Other abnormalities of gait and mobility (R26.89);Muscle weakness (generalized) (M62.81);Other symptoms and signs involving the nervous system (R29.898)  PT Problem List Decreased strength;Decreased range of motion;Decreased balance;Decreased activity tolerance;Decreased mobility;Decreased coordination;Decreased cognition  PT Plan  PT Frequency (ACUTE ONLY) Min 2X/week  PT Treatment/Interventions (ACUTE ONLY) DME instruction;Gait training;Stair training;Functional mobility training;Therapeutic exercise;Balance training;Therapeutic activities;Neuromuscular re-education;Patient/family education;Manual techniques;Cognitive remediation  AM-PAC PT "6 Clicks" Mobility Outcome Measure (Version 2)  Help needed turning from your back to your side while in a flat bed without using bedrails? 1  Help needed moving from lying on your back to sitting on the side of a flat bed without using bedrails? 1  Help needed moving to and from a bed to a chair (including a wheelchair)? 1  Help needed standing up from a chair using your arms (e.g., wheelchair or bedside chair)? 1  Help needed to walk in hospital room? 1  Help needed climbing 3-5 steps with a railing?  1  6 Click Score 6  Consider Recommendation of Discharge To:  CIR/SNF/LTACH  PT Recommendation  Recommendations for Other Services OT consult  Follow Up Recommendations SNF  PT equipment None recommended by PT  Individuals Consulted  Consulted and Agree with Results and Recommendations Patient  Acute Rehab PT Goals  Patient Stated Goal none stated "feels good to sit"  PT Goal Formulation With patient  Time For Goal Achievement 07/16/20  Potential to Achieve Goals Fair  PT Time Calculation  PT Start Time (ACUTE ONLY) 1523  PT Stop Time (ACUTE ONLY) 1548  PT Time Calculation (min) (ACUTE ONLY) 25 min  PT General Charges  $$ ACUTE PT VISIT 1 Visit  PT Evaluation  $PT Eval Moderate Complexity 1 Mod  PT Treatments  $Therapeutic Activity 8-22 mins  Written Expression  Dominant Hand Right     Verner Mould, DPT Acute Rehabilitation Services Office [phone] Pager [phone]    Jacques Navy 07/02/2020, 7:26 PM

## 2020-07-03 LAB — GLUCOSE, CAPILLARY
Glucose-Capillary: 104 mg/dL — ABNORMAL HIGH (ref 70–99)
Glucose-Capillary: 125 mg/dL — ABNORMAL HIGH (ref 70–99)
Glucose-Capillary: 137 mg/dL — ABNORMAL HIGH (ref 70–99)
Glucose-Capillary: 96 mg/dL (ref 70–99)

## 2020-07-03 MED ORDER — METOPROLOL SUCCINATE ER 25 MG PO TB24: 12.5000 mg | ORAL_TABLET | Freq: Every day | ORAL | Status: AC

## 2020-07-03 NOTE — Progress Notes (Signed)
Report given to SNF at 14:02. PTAR came and got the patient with discharge packet and dysphagia recommendations in hand. Patient's daughter, [name], was notified of the patient leaving the facility. Patient's IV and telemetry box disconnected.

## 2020-07-03 NOTE — TOC Transition Note (Signed)
Transition of Care [facility]) - CM/SW Discharge Note   Patient Details  Name: Betty Chan MRN: [id number] Date of Birth: 1930/03/13  Transition of Care [facility]) CM/SW Contact:  Betty Catalan, RN Phone Number: 07/03/2020, 12:34 PM   Clinical Narrative:     Pt to dc back to [facility] today. Daughter [name] had requested that PT see pt here in the hospital and that we try and get auth for her to use her Medicare SNF benefits at [facility]. [place] contacted and auth started. Auth ID [id number]. [name] alerted that [facility] Kaufmann was started and she would need to follow up with [facility] at [employer]. [facility] to be contacted for transport. Yellow DNR on chart. RN to call report to [phone].  Final next level of care: Long Term Nursing Home Barriers to Discharge: Continued Medical Work up   Discharge Plan and Services   Discharge Planning Services: CM Consult                Social Determinants of Health (SDOH) Interventions     Readmission Risk Interventions No flowsheet data found.

## 2020-07-03 NOTE — Discharge Summary (Signed)
Physician Discharge Summary  Lasonja Lakins ZOX:[id number] DOB: [birth year] DOA: 06/25/2020  PCP: Maurice Small, MD  Admit date: 06/25/2020 Discharge date: 07/03/2020  Admitted From: ALF Disposition: ALF  Recommendations for Outpatient Follow-up:  1. Follow up with PCP in 1 week 2. Consider SNF level services 3. Please follow up on the following pending results: None   Discharge Condition: Stable CODE STATUS: Full code Diet recommendation:   Dysphagia 1 (puree);Nectar-thick liquid Liquids provided via: Cup;Straw Medication Administration: Crushed with puree Supervision: Full supervision/cueing for compensatory strategies;Trained caregiver to feed patient Compensations: Minimize environmental distractions;Slow rate;Small sips/bites Postural Changes and/or Swallow Maneuvers: Seated upright 90 degrees;Upright 30-60 min after meal   Brief/Interim Summary:  Admission HPI written by Nita Sells, MD   Chief Complaint: Altered mental status.  History obtained from ER physician.  HPI: Camary Sosa is a 87 y.o. female with known history of progressive supranuclear palsy, status post TAVR and chronic systolic heart failure, hypothyroidism dementia was brought to the ER after patient was found to be having change in mental status since morning.  As per the report patient was doing fine last night.  Hospital course:  Acute metabolic encephalopathy Appears to be secondary to urinary tract infection.  Patient is significant improved from admission.  Appeared to be resolved, however today patient is having worsening mental status with some hallucinations. Medications reviewed and unlikely secondary to medications. Seems to be resolved.  Sepsis Present on admission.  Secondary to UTI versus infected heel ulcer.  No osteomyelitis noted on MRI.  Urine culture significant for E. Coli. She is being treated with Ceftriaxone. Recurrent fever overnight on 1/6. No leukocytosis.  Repeat cultures with no growth to date.  UTI E. coli.  Resistant to amoxicillin.  Patient is currently on ceftriaxone and has completed course.  Right foot ulcer Left heel.  CT negative for osteomyelitis.  Possible infection.  Will manage with antibiotics as mentioned above. Completed 7 days.  AKI Baseline creatinine appears to be around 1.1.  Creatinine of 1.42 on admission with improvement and resolution of AKI with IV fluids.  Dysphagia In setting of underlying Parkinson disease in addition to metabolic encephalopathy.  Speech is reevaluated and is recommending dysphagia diet with nectar thick liquids. History of silent aspiration. Speech therapy recommended Dysphagia 1 diet, nectar thickened liquids  Positive blood cultures Only one set of cultures. Culture significant for staphylococcus warneri. Likely contaminant. Started on Vancomycin IV on 1/4 which is discontinued. Repeat blood cultures with no growth.  Severe aortic stenosis Patient status post TAVR.  Complicated by history of systolic heart failure.  Hypernatremia Mild and resolved. Discontinue IV fluids.  Chronic systolic heart failure Patient appears to be euvolemic.  Patient is on lisinopril 5 mg, metoprolol tartrate 50 mg twice daily, Lasix 20 mg daily.  Blood pressure currently on softer side.  Medications held secondary to poor mental status on admission. Resumed Metoprolol XL at a dose of 12.5 mg daily. Resume lasix. Hold lisinopril but restart as needed.  Bradycardia In setting of metoprolol use. Could also be contributing to mental status issues. Improved with decreased metoprolol dose. Continue metoprolol XL 12.5 mg daily.  Progressive supranuclear palsy Parkinson disease Continue Sinemet  Essential hypertension Metoprolol. Resume Lasix. Hold lisinopril; resume if blood pressure requires.  Hypothyroidism Continue Synthroid 50 micrograms  Breast cancer Patient is status post  lumpectomy.  Pressure injury Left heel, POA  Discharge Diagnoses:  Principal Problem:   Acute encephalopathy Active Problems:   Hypothyroid   Hyperlipidemia  History of breast cancer   Essential hypertension   H/O aortic valve replacement TAVR   Pressure injury of skin   DNR (do not resuscitate)   Sepsis [facility])    Discharge Instructions  Discharge Instructions    Call MD for:  difficulty breathing, headache or visual disturbances   Complete by: As directed    Discharge wound care:   Complete by: As directed    Foam dressing  Every 3 days      Comments: Silicone foam dressings to the left heel change every 3 days. Assess under dressings each shift for any acute changes in the wounds.  Wound care  Daily       Comments: Apply 1/4" thick layer of Santyl to the right heel wound, top with 2x2 moist with saline gauze. Top with heel foam dressing. Ok to peel back the foam daily to reapply the Santyl and moist dressing and change the foam topper every 3 days.   Increase activity slowly   Complete by: As directed      Allergies as of 07/03/2020      Reactions   Amoxicillin Rash   Penicillins Rash      Medication List    STOP taking these medications   lisinopril 5 MG tablet Commonly known as: ZESTRIL   metoprolol tartrate 50 MG tablet Commonly known as: LOPRESSOR     TAKE these medications   acetaminophen 325 MG tablet Commonly known as: TYLENOL Take 2 tablets (650 mg total) by mouth every 4 (four) hours as needed for headache or mild pain.   atorvastatin 20 MG tablet Commonly known as: LIPITOR TAKE 1 TABLET BY MOUTH EVERY DAY AT 6 PM What changed: See the new instructions.   Biofreeze 4 % Gel Generic drug: Menthol (Topical Analgesic) Apply 1 application topically 3 (three) times daily as needed (pain).   capsaicin 0.025 % cream Commonly known as: ZOSTRIX Apply topically 3 (three) times daily as needed (arthritis pain to knee).   carbidopa-levodopa 25-100  MG tablet Commonly known as: SINEMET IR Take 1 tablet by mouth at bedtime.   Decubi-Vite Caps Take 1 capsule by mouth daily.   docusate sodium 100 MG capsule Commonly known as: COLACE Take 100 mg by mouth daily.   furosemide 20 MG tablet Commonly known as: LASIX Take 1 tablet (20 mg total) by mouth daily.   guaifenesin 100 MG/5ML syrup Commonly known as: ROBITUSSIN Take 400 mg by mouth every 6 (six) hours.   levalbuterol 0.63 MG/3ML nebulizer solution Commonly known as: XOPENEX Take 3 mLs (0.63 mg total) by nebulization every 6 (six) hours as needed for wheezing or shortness of breath.   levothyroxine 50 MCG tablet Commonly known as: SYNTHROID Take 50 mcg by mouth daily.   metoprolol succinate 25 MG 24 hr tablet Commonly known as: TOPROL-XL Take 0.5 tablets (12.5 mg total) by mouth daily. Start taking on: July 04, 2020 What changed:   medication strength  how much to take  when to take this  additional instructions   nutrition supplement (JUVEN) Pack Take 1 packet by mouth daily.   oxybutynin 5 MG tablet Commonly known as: DITROPAN Take 5 mg by mouth 2 (two) times daily.   polyethylene glycol 17 g packet Commonly known as: MIRALAX / GLYCOLAX Take 17 g by mouth daily as needed for mild constipation.   sodium chloride 0.65 % Soln nasal spray Commonly known as: OCEAN Place 2 sprays into both nostrils as needed for congestion.  Discharge Care Instructions  (From admission, onward)         Start     Ordered   07/03/20 0000  Discharge wound care:       Comments: Foam dressing  Every 3 days      Comments: Silicone foam dressings to the left heel change every 3 days. Assess under dressings each shift for any acute changes in the wounds.  Wound care  Daily       Comments: Apply 1/4" thick layer of Santyl to the right heel wound, top with 2x2 moist with saline gauze. Top with heel foam dressing. Ok to peel back the foam daily to reapply the  Santyl and moist dressing and change the foam topper every 3 days.   07/03/20 1220          Allergies  Allergen Reactions  . Amoxicillin Rash  . Penicillins Rash    Consultations:  None   Procedures/Studies: CT Head Wo Contrast  Result Date: 06/25/2020 CLINICAL DATA:  Persistently worsening mental status changes. EXAM: CT HEAD WITHOUT CONTRAST TECHNIQUE: Contiguous axial images were obtained from the base of the skull through the vertex without intravenous contrast. COMPARISON:  Report from study 11/24/2014 FINDINGS: Brain: Age related volume loss. Chronic small-vessel ischemic changes of the cerebral hemispheric white matter. No visible acute infarction, mass lesion, hemorrhage, hydrocephalus or extra-axial collection. Vascular: There is atherosclerotic calcification of the major vessels at the base of the brain. Skull: Negative Sinuses/Orbits: Clear/normal Other: None IMPRESSION: No acute finding by CT. Age related volume loss. Chronic small-vessel ischemic changes of the cerebral hemispheric white matter. Electronically Signed   By: Nelson Chimes M.D.   On: 06/25/2020 13:43   MR BRAIN WO CONTRAST  Result Date: 06/25/2020 CLINICAL DATA:  Mental status change EXAM: MRI HEAD WITHOUT CONTRAST TECHNIQUE: Multiplanar, multiecho pulse sequences of the brain and surrounding structures were obtained without intravenous contrast. COMPARISON:  Head CT June 25, 2020 FINDINGS: Brain: No acute infarction, hemorrhage, hydrocephalus, extra-axial collection or mass lesion. Foci of of increased signal on the diffusion weighted images in the pontine tegmentum without corresponding low signal on ADC may be artifactual. Remote lacunar infarcts in the bilateral cerebellar hemisphere and bilateral caudate head. Scattered and confluent foci of T2 hyperintensity are seen within the white matter of the cerebral hemispheres and within the pons, nonspecific, most likely related to chronic microangiopathic changes.  Mild parenchymal volume loss. Vascular: Normal flow voids. Skull and upper cervical spine: Normal marrow signal. Sinuses/Orbits: Mucosal thickening of the ethmoid cells and left maxillary sinus. Bilateral lens surgery. IMPRESSION: 1. No acute intracranial abnormality. 2. Foci of increased signal on the diffusion weighted images in the pontine tegmentum without corresponding low signal on ADC may be artifactual. 3. Remote lacunar infarcts in the bilateral cerebellar hemisphere and bilateral caudate head. 4. Moderate chronic microangiopathic changes and mild parenchymal volume loss. Electronically Signed   By: Pedro Earls M.D.   On: 06/25/2020 18:26   CT FOOT RIGHT WO CONTRAST  Result Date: 06/26/2020 CLINICAL DATA:  Left heel ulceration.  Question osteomyelitis. EXAM: CT OF THE RIGHT FOOT WITHOUT CONTRAST TECHNIQUE: Multidetector CT imaging of the right foot was performed according to the standard protocol. Multiplanar CT image reconstructions were also generated. COMPARISON:  Plain films right foot 06/25/2020 and 03/30/2018. FINDINGS: Bones/Joint/Cartilage No bony destructive change, periosteal reaction or loss of cortical bone is identified. No intraosseous gas is seen. No fracture or dislocation. No joint effusion is identified. Ligaments Suboptimally assessed  by CT. Muscles and Tendons There is no intramuscular fluid collection or gas within muscle or tracking along fascial planes. No tendon tear is identified. Muscles are diffusely atrophic. Soft tissues Skin ulceration over the posteromedial calcaneus is noted. No focal fluid collection is seen. IMPRESSION: Skin ulceration without evidence of abscess, osteomyelitis or septic joint. Electronically Signed   By: Inge Rise M.D.   On: 06/26/2020 13:21   DG CHEST PORT 1 VIEW  Result Date: 06/29/2020 CLINICAL DATA:  Hypoxia EXAM: PORTABLE CHEST 1 VIEW COMPARISON:  Four days ago FINDINGS: Cardiomegaly. Transcatheter aortic valve  replacement. Chronic interstitial coarsening. There is no edema, consolidation, effusion, or pneumothorax. IMPRESSION: 1. Atelectatic type opacities at the bases. 2. Cardiomegaly. 3. Stable from prior. Electronically Signed   By: Monte Fantasia M.D.   On: 06/29/2020 10:49   DG Chest Portable 1 View  Result Date: 06/25/2020 CLINICAL DATA:  Altered mental status, oxygen requirement, cough EXAM: PORTABLE CHEST 1 VIEW COMPARISON:  01/26/2020 FINDINGS: Similar low lung volumes and basilar opacities favored to be atelectasis. Stable cardiomegaly and previous TAVR. Remote right axillary lymph node dissection noted. No enlarging effusion or pneumothorax. Trachea midline. Aorta atherosclerotic. Exam is rotated to the left. IMPRESSION: Stable low volume exam with basilar atelectasis. Cardiomegaly without CHF Aortic Atherosclerosis (ICD10-I70.0). Electronically Signed   By: Jerilynn Mages.  Shick M.D.   On: 06/25/2020 12:48   DG Foot Complete Right  Result Date: 06/25/2020 CLINICAL DATA:  Soft tissue wound right calcaneal region EXAM: RIGHT FOOT COMPLETE - 3+ VIEW COMPARISON:  March 30, 2018 FINDINGS: Frontal, oblique, and lateral views were obtained. Bones are diffusely osteoporotic. There is evidence of an old healed fracture of the proximal aspect of the fifth proximal phalanx. No acute fracture or dislocation evident. There are hammertoe deformities involving the second, third, fourth, and fifth digits with flexion of the second, third, fourth, and fifth PIP and DIP joints and extension of the second, third, fourth, and fifth MTP joints. There is moderately severe joint space narrowing involving the first MTP joint. No significant narrowing elsewhere. There is an inferior calcaneal spur. Soft tissue lucency is seen posterior to the inferior calcaneus. No erosive change or bony destruction. No radiopaque foreign body. IMPRESSION: Lucency along the posteroinferior hindfoot. No radiopaque foreign body. No bony destruction or  erosion. Underlying osteoporosis. Old healed fracture first proximal phalanx with remodeling. No acute fracture or dislocation. Hammertoe deformities at multiple sites. Severe narrowing first MTP joint. Inferior calcaneal spur noted. Electronically Signed   By: Lowella Grip III M.D.   On: 06/25/2020 12:49      Subjective: No issues today. Wants to watch TV.  Discharge Exam: Vitals:   07/02/20 2009 07/03/20 0434  BP: 112/61 (!) 110/46  Pulse: 72 65  Resp: 16 14  Temp: 98.5 F (36.9 C) 98.5 F (36.9 C)  SpO2: 96% 95%   Vitals:   07/02/20 1431 07/02/20 1800 07/02/20 2009 07/03/20 0434  BP: 125/66  112/61 (!) 110/46  Pulse: 76  72 65  Resp: 16  16 14   Temp: 97.7 F (36.5 C)  98.5 F (36.9 C) 98.5 F (36.9 C)  TempSrc: Oral  Oral Oral  SpO2: 94% 95% 96% 95%  Weight:      Height:        General: Pt is alert, awake, not in acute distress Cardiovascular: RRR, S1/S2 +, no rubs, no gallops Respiratory: CTA bilaterally, no wheezing, no rhonchi Abdominal: Soft, NT, ND, bowel sounds + Extremities: no cyanosis  The results of significant diagnostics from this hospitalization (including imaging, microbiology, ancillary and laboratory) are listed below for reference.     Microbiology: Recent Results (from the past 240 hour(s))  Blood culture (routine x 2)     Status: None   Collection Time: 06/25/20 12:55 PM   Specimen: BLOOD  Result Value Ref Range Status   Specimen Description   Final    BLOOD LEFT ANTECUBITAL Performed at [facility] [address] [place], [place] [address]    Special Requests   Final    BOTTLES DRAWN AEROBIC AND ANAEROBIC Blood Culture adequate volume Performed at [facility] [address], [place], [place] [address]    Culture   Final    NO GROWTH 5 DAYS Performed at [facility], [place] [address] [place], [place] [address]    Report Status 06/30/2020 FINAL  Final  Blood culture (routine x  2)     Status: Abnormal   Collection Time: 06/25/20 12:55 PM   Specimen: BLOOD LEFT WRIST  Result Value Ref Range Status   Specimen Description   Final    BLOOD LEFT WRIST Performed at [facility] [address] [place], [place] [address]    Special Requests   Final    BOTTLES DRAWN AEROBIC ONLY Blood Culture results may not be optimal due to an inadequate volume of blood received in culture bottles Performed at [facility] [address], [place], [place] [address]    Culture  Setup Time   Final    GRAM POSITIVE COCCI IN CLUSTERS AEROBIC BOTTLE ONLY CRITICAL RESULT CALLED TO, READ BACK BY AND VERIFIED WITH: Guadlupe Spanish PharmD 10:00 06/26/20 ([email or web address])    Culture (A)  Final    STAPHYLOCOCCUS WARNERI THE SIGNIFICANCE OF ISOLATING THIS ORGANISM FROM A SINGLE SET OF BLOOD CULTURES WHEN MULTIPLE SETS ARE DRAWN IS UNCERTAIN. PLEASE NOTIFY THE MICROBIOLOGY DEPARTMENT WITHIN ONE WEEK IF SPECIATION AND SENSITIVITIES ARE REQUIRED. Performed at [facility], [place] [address] [place],  [address]    Report Status 06/27/2020 FINAL  Final  Blood Culture ID Panel (Reflexed)     Status: Abnormal   Collection Time: 06/25/20 12:55 PM  Result Value Ref Range Status   Enterococcus faecalis NOT DETECTED NOT DETECTED Final   Enterococcus Faecium NOT DETECTED NOT DETECTED Final   Listeria monocytogenes NOT DETECTED NOT DETECTED Final   Staphylococcus species DETECTED (A) NOT DETECTED Final    Comment: CRITICAL RESULT CALLED TO, READ BACK BY AND VERIFIED WITH: Guadlupe Spanish PharmD 10:00 06/26/20 ([email or web address])    Staphylococcus aureus (BCID) NOT DETECTED NOT DETECTED Final   Staphylococcus epidermidis NOT DETECTED NOT DETECTED Final   Staphylococcus lugdunensis NOT DETECTED NOT DETECTED Final   Streptococcus species NOT DETECTED NOT DETECTED Final   Streptococcus agalactiae NOT DETECTED NOT DETECTED Final   Streptococcus pneumoniae NOT DETECTED NOT DETECTED Final    Streptococcus pyogenes NOT DETECTED NOT DETECTED Final   A.calcoaceticus-baumannii NOT DETECTED NOT DETECTED Final   Bacteroides fragilis NOT DETECTED NOT DETECTED Final   Enterobacterales NOT DETECTED NOT DETECTED Final   Enterobacter cloacae complex NOT DETECTED NOT DETECTED Final   Escherichia coli NOT DETECTED NOT DETECTED Final   Klebsiella aerogenes NOT DETECTED NOT DETECTED Final   Klebsiella oxytoca NOT DETECTED NOT DETECTED Final   Klebsiella pneumoniae NOT DETECTED NOT DETECTED Final   Proteus species NOT DETECTED NOT DETECTED Final   Salmonella species NOT DETECTED NOT DETECTED Final  Serratia marcescens NOT DETECTED NOT DETECTED Final   Haemophilus influenzae NOT DETECTED NOT DETECTED Final   Neisseria meningitidis NOT DETECTED NOT DETECTED Final   Pseudomonas aeruginosa NOT DETECTED NOT DETECTED Final   Stenotrophomonas maltophilia NOT DETECTED NOT DETECTED Final   Candida albicans NOT DETECTED NOT DETECTED Final   Candida auris NOT DETECTED NOT DETECTED Final   Candida glabrata NOT DETECTED NOT DETECTED Final   Candida krusei NOT DETECTED NOT DETECTED Final   Candida parapsilosis NOT DETECTED NOT DETECTED Final   Candida tropicalis NOT DETECTED NOT DETECTED Final   Cryptococcus neoformans/gattii NOT DETECTED NOT DETECTED Final    Comment: Performed at [facility], [place] [address], [place], [place] [address]  Resp Panel by RT-PCR (Flu A&B, Covid) Nasopharyngeal Swab     Status: None   Collection Time: 06/25/20 12:59 PM   Specimen: Nasopharyngeal Swab; Nasopharyngeal(NP) swabs in vial transport medium  Result Value Ref Range Status   SARS Coronavirus 2 by RT PCR NEGATIVE NEGATIVE Final    Comment: (NOTE) SARS-CoV-2 target nucleic acids are NOT DETECTED.  The SARS-CoV-2 RNA is generally detectable in upper respiratory specimens during the acute phase of infection. The lowest concentration of SARS-CoV-2 viral copies this assay can detect is 138 copies/mL. A  negative result does not preclude SARS-Cov-2 infection and should not be used as the sole basis for treatment or other patient management decisions. A negative result may occur with  improper specimen collection/handling, submission of specimen other than nasopharyngeal swab, presence of viral mutation(s) within the areas targeted by this assay, and inadequate number of viral copies(<138 copies/mL). A negative result must be combined with clinical observations, patient history, and epidemiological information. The expected result is Negative.  Fact Sheet for Patients:  EntrepreneurPulse.com.au  Fact Sheet for Healthcare Providers:  IncredibleEmployment.be  This test is no t yet approved or cleared by the [place] FDA and  has been authorized for detection and/or diagnosis of SARS-CoV-2 by FDA under an Emergency Use Authorization (EUA). This EUA will remain  in effect (meaning this test can be used) for the duration of the COVID-19 declaration under Section 564(b)(1) of the Act, 21 U.S.C.section 360bbb-3(b)(1), unless the authorization is terminated  or revoked sooner.       Influenza A by PCR NEGATIVE NEGATIVE Final   Influenza B by PCR NEGATIVE NEGATIVE Final    Comment: (NOTE) The Xpert Xpress SARS-CoV-2/FLU/RSV plus assay is intended as an aid in the diagnosis of influenza from Nasopharyngeal swab specimens and should not be used as a sole basis for treatment. Nasal washings and aspirates are unacceptable for Xpert Xpress SARS-CoV-2/FLU/RSV testing.  Fact Sheet for Patients: EntrepreneurPulse.com.au  Fact Sheet for Healthcare Providers: IncredibleEmployment.be  This test is not yet approved or cleared by the [place] FDA and has been authorized for detection and/or diagnosis of SARS-CoV-2 by FDA under an Emergency Use Authorization (EUA). This EUA will remain in effect (meaning this test can  be used) for the duration of the COVID-19 declaration under Section 564(b)(1) of the Act, 21 U.S.C. section 360bbb-3(b)(1), unless the authorization is terminated or revoked.  Performed at [facility], [place] [address] [place], [place] [address]   Urine culture     Status: Abnormal   Collection Time: 06/25/20  4:30 PM   Specimen: Urine, Random  Result Value Ref Range Status   Specimen Description   Final    URINE, RANDOM Performed at [facility] [address] [place],  [place] [address]    Special Requests   Final    NONE Performed at [facility], [place] [address] [place], [place] [address]    Culture >=100,000 COLONIES/mL ESCHERICHIA COLI (A)  Final   Report Status 06/27/2020 FINAL  Final   Organism ID, Bacteria ESCHERICHIA COLI (A)  Final      Susceptibility   Escherichia coli - MIC*    AMPICILLIN 4 SENSITIVE Sensitive     CEFAZOLIN <=4 SENSITIVE Sensitive     CEFEPIME <=0.12 SENSITIVE Sensitive     CEFTRIAXONE <=0.25 SENSITIVE Sensitive     CIPROFLOXACIN <=0.25 SENSITIVE Sensitive     GENTAMICIN <=1 SENSITIVE Sensitive     IMIPENEM <=0.25 SENSITIVE Sensitive     NITROFURANTOIN <=16 SENSITIVE Sensitive     TRIMETH/SULFA <=20 SENSITIVE Sensitive     AMPICILLIN/SULBACTAM <=2 SENSITIVE Sensitive     PIP/TAZO <=4 SENSITIVE Sensitive     * >=100,000 COLONIES/mL ESCHERICHIA COLI  MRSA PCR Screening     Status: Abnormal   Collection Time: 06/26/20 12:53 AM   Specimen: Nasal Mucosa; Nasopharyngeal  Result Value Ref Range Status   MRSA by PCR POSITIVE (A) NEGATIVE Final    Comment:        The GeneXpert MRSA Assay (FDA approved for NASAL specimens only), is one component of a comprehensive MRSA colonization surveillance program. It is not intended to diagnose MRSA infection nor to guide or monitor treatment for MRSA infections. RESULT CALLED TO, READ BACK BY AND VERIFIED WITH: RIBINSON K. 12.04.22 @ 0210 BY  MECIAL J. Performed at [facility], [place] [address], [place], [place] [address]   Culture, blood (routine x 2)     Status: None (Preliminary result)   Collection Time: 06/29/20  9:38 AM   Specimen: BLOOD LEFT HAND  Result Value Ref Range Status   Specimen Description   Final    BLOOD LEFT HAND Performed at [facility] [address], [place], [place] [address]    Special Requests   Final    BOTTLES DRAWN AEROBIC AND ANAEROBIC Blood Culture adequate volume Performed at [facility] [address], [place], [place] [address]    Culture   Final    NO GROWTH 4 DAYS Performed at [facility], [place] [address], [place], [place] [address]    Report Status PENDING  Incomplete  Culture, blood (routine x 2)     Status: None (Preliminary result)   Collection Time: 06/29/20  9:42 AM   Specimen: BLOOD LEFT HAND  Result Value Ref Range Status   Specimen Description   Final    BLOOD LEFT HAND Performed at [facility] [address], [place], [place] [address]    Special Requests   Final    BOTTLES DRAWN AEROBIC ONLY Blood Culture adequate volume Performed at [facility] [address] [place], [place] [address]    Culture   Final    NO GROWTH 4 DAYS Performed at [facility], [place] [address], [place], [place] [address]    Report Status PENDING  Incomplete  Culture, Urine     Status: None   Collection Time: 06/29/20  2:58 PM   Specimen: Urine, Random  Result Value Ref Range Status   Specimen Description   Final    URINE, RANDOM Performed at [facility] [address], [place], [place] [address]    Special Requests   Final  NONE Performed at [facility], [place] [address], [place], [place] [address]    Culture   Final    NO GROWTH Performed at [facility], [place] [address] [place], [place] [address]    Report Status  06/30/2020 FINAL  Final  SARS CORONAVIRUS 2 (TAT 6-24 HRS) Nasopharyngeal Nasopharyngeal Swab     Status: None   Collection Time: 07/02/20 11:00 AM   Specimen: Nasopharyngeal Swab  Result Value Ref Range Status   SARS Coronavirus 2 NEGATIVE NEGATIVE Final    Comment: (NOTE) SARS-CoV-2 target nucleic acids are NOT DETECTED.  The SARS-CoV-2 RNA is generally detectable in upper and lower respiratory specimens during the acute phase of infection. Negative results do not preclude SARS-CoV-2 infection, do not rule out co-infections with other pathogens, and should not be used as the sole basis for treatment or other patient management decisions. Negative results must be combined with clinical observations, patient history, and epidemiological information. The expected result is Negative.  Fact Sheet for Patients: SugarRoll.be  Fact Sheet for Healthcare Providers: https://www.woods-mathews.com/  This test is not yet approved or cleared by the [place] FDA and  has been authorized for detection and/or diagnosis of SARS-CoV-2 by FDA under an Emergency Use Authorization (EUA). This EUA will remain  in effect (meaning this test can be used) for the duration of the COVID-19 declaration under Se ction 564(b)(1) of the Act, 21 U.S.C. section 360bbb-3(b)(1), unless the authorization is terminated or revoked sooner.  Performed at [facility], [place] [address] [place], [place] [address]      Labs: BNP (last 3 results) Recent Labs    01/17/20 1547 06/25/20 1255  BNP 504.5* 123456*   Basic Metabolic Panel: Recent Labs  Lab 06/27/20 0939 06/29/20 0942 06/30/20 0818 07/01/20 1128  NA 140 146* 147* 140  K 4.3 4.0 4.0  --   CL 105 113* 113*  --   CO2 24 22 24   --   GLUCOSE 112* 123* 120*  --   BUN 34* 26* 28*  --   CREATININE 1.14* 1.11* 0.98  --   CALCIUM 9.0 8.7* 8.8*  --    Liver Function Tests: Recent Labs  Lab  06/27/20 0939 06/29/20 0942  AST 30 26  ALT 35 13  ALKPHOS 77 68  BILITOT 1.1 0.7  PROT 7.0 6.3*  ALBUMIN 3.0* 2.7*   No results for input(s): LIPASE, AMYLASE in the last 168 hours. Recent Labs  Lab 06/29/20 0942  AMMONIA 18   CBC: Recent Labs  Lab 06/27/20 0939 06/29/20 0942  WBC 9.0 6.6  NEUTROABS 7.2  --   HGB 12.8 11.1*  HCT 39.2 35.5*  MCV 100.5* 106.0*  PLT 185 195   Cardiac Enzymes: No results for input(s): CKTOTAL, CKMB, CKMBINDEX, TROPONINI in the last 168 hours. BNP: Invalid input(s): POCBNP CBG: Recent Labs  Lab 07/02/20 1243 07/02/20 1825 07/03/20 0000 07/03/20 0535 07/03/20 1152  GLUCAP 107* 171* 137* 125* 104*   D-Dimer No results for input(s): DDIMER in the last 72 hours. Hgb A1c No results for input(s): HGBA1C in the last 72 hours. Lipid Profile No results for input(s): CHOL, HDL, LDLCALC, TRIG, CHOLHDL, LDLDIRECT in the last 72 hours. Thyroid function studies No results for input(s): TSH, T4TOTAL, T3FREE, THYROIDAB in the last 72 hours.  Invalid input(s): FREET3 Anemia work up No results for input(s): VITAMINB12, FOLATE, FERRITIN, TIBC, IRON, RETICCTPCT in the last 72 hours. Urinalysis    Component Value Date/Time  COLORURINE YELLOW 06/25/2020 1630   APPEARANCEUR CLOUDY (A) 06/25/2020 1630   LABSPEC 1.015 06/25/2020 1630   PHURINE 6.5 06/25/2020 1630   GLUCOSEU NEGATIVE 06/25/2020 1630   HGBUR SMALL (A) 06/25/2020 1630   BILIRUBINUR NEGATIVE 06/25/2020 1630   KETONESUR NEGATIVE 06/25/2020 1630   PROTEINUR NEGATIVE 06/25/2020 1630   UROBILINOGEN 1.0 11/25/2014 0220   NITRITE NEGATIVE 06/25/2020 1630   LEUKOCYTESUR LARGE (A) 06/25/2020 1630   Sepsis Labs Invalid input(s): PROCALCITONIN,  WBC,  LACTICIDVEN Microbiology Recent Results (from the past 240 hour(s))  Blood culture (routine x 2)     Status: None   Collection Time: 06/25/20 12:55 PM   Specimen: BLOOD  Result Value Ref Range Status   Specimen Description   Final     BLOOD LEFT ANTECUBITAL Performed at [facility], [place] [address] [place], [place] [address]    Special Requests   Final    BOTTLES DRAWN AEROBIC AND ANAEROBIC Blood Culture adequate volume Performed at [facility] [address], [place], [place] [address]    Culture   Final    NO GROWTH 5 DAYS Performed at [facility], [place] [address] [place], [place] [address]    Report Status 06/30/2020 FINAL  Final  Blood culture (routine x 2)     Status: Abnormal   Collection Time: 06/25/20 12:55 PM   Specimen: BLOOD LEFT WRIST  Result Value Ref Range Status   Specimen Description   Final    BLOOD LEFT WRIST Performed at [facility] [address] [place], [place] [address]    Special Requests   Final    BOTTLES DRAWN AEROBIC ONLY Blood Culture results may not be optimal due to an inadequate volume of blood received in culture bottles Performed at [facility] [address] [place], [place] [address]    Culture  Setup Time   Final    GRAM POSITIVE COCCI IN CLUSTERS AEROBIC BOTTLE ONLY CRITICAL RESULT CALLED TO, READ BACK BY AND VERIFIED WITH: Guadlupe Spanish PharmD 10:00 06/26/20 ([email or web address])    Culture (A)  Final    STAPHYLOCOCCUS WARNERI THE SIGNIFICANCE OF ISOLATING THIS ORGANISM FROM A SINGLE SET OF BLOOD CULTURES WHEN MULTIPLE SETS ARE DRAWN IS UNCERTAIN. PLEASE NOTIFY THE MICROBIOLOGY DEPARTMENT WITHIN ONE WEEK IF SPECIATION AND SENSITIVITIES ARE REQUIRED. Performed at [facility], [place] [address], [place], [place] [address]    Report Status 06/27/2020 FINAL  Final  Blood Culture ID Panel (Reflexed)     Status: Abnormal   Collection Time: 06/25/20 12:55 PM  Result Value Ref Range Status   Enterococcus faecalis NOT DETECTED NOT DETECTED Final   Enterococcus Faecium NOT DETECTED NOT DETECTED Final   Listeria monocytogenes NOT DETECTED NOT DETECTED Final   Staphylococcus species DETECTED (A) NOT DETECTED Final     Comment: CRITICAL RESULT CALLED TO, READ BACK BY AND VERIFIED WITH: Guadlupe Spanish PharmD 10:00 06/26/20 ([email or web address])    Staphylococcus aureus (BCID) NOT DETECTED NOT DETECTED Final   Staphylococcus epidermidis NOT DETECTED NOT DETECTED Final   Staphylococcus lugdunensis NOT DETECTED NOT DETECTED Final   Streptococcus species NOT DETECTED NOT DETECTED Final   Streptococcus agalactiae NOT DETECTED NOT DETECTED Final   Streptococcus pneumoniae NOT DETECTED NOT DETECTED Final   Streptococcus pyogenes NOT DETECTED NOT DETECTED Final   A.calcoaceticus-baumannii NOT DETECTED NOT DETECTED Final   Bacteroides fragilis NOT DETECTED NOT DETECTED Final   Enterobacterales NOT DETECTED NOT DETECTED Final   Enterobacter  cloacae complex NOT DETECTED NOT DETECTED Final   Escherichia coli NOT DETECTED NOT DETECTED Final   Klebsiella aerogenes NOT DETECTED NOT DETECTED Final   Klebsiella oxytoca NOT DETECTED NOT DETECTED Final   Klebsiella pneumoniae NOT DETECTED NOT DETECTED Final   Proteus species NOT DETECTED NOT DETECTED Final   Salmonella species NOT DETECTED NOT DETECTED Final   Serratia marcescens NOT DETECTED NOT DETECTED Final   Haemophilus influenzae NOT DETECTED NOT DETECTED Final   Neisseria meningitidis NOT DETECTED NOT DETECTED Final   Pseudomonas aeruginosa NOT DETECTED NOT DETECTED Final   Stenotrophomonas maltophilia NOT DETECTED NOT DETECTED Final   Candida albicans NOT DETECTED NOT DETECTED Final   Candida auris NOT DETECTED NOT DETECTED Final   Candida glabrata NOT DETECTED NOT DETECTED Final   Candida krusei NOT DETECTED NOT DETECTED Final   Candida parapsilosis NOT DETECTED NOT DETECTED Final   Candida tropicalis NOT DETECTED NOT DETECTED Final   Cryptococcus neoformans/gattii NOT DETECTED NOT DETECTED Final    Comment: Performed at [facility], [place] [address], [place], [place] [address]  Resp Panel by RT-PCR (Flu A&B, Covid) Nasopharyngeal Swab     Status: None   Collection  Time: 06/25/20 12:59 PM   Specimen: Nasopharyngeal Swab; Nasopharyngeal(NP) swabs in vial transport medium  Result Value Ref Range Status   SARS Coronavirus 2 by RT PCR NEGATIVE NEGATIVE Final    Comment: (NOTE) SARS-CoV-2 target nucleic acids are NOT DETECTED.  The SARS-CoV-2 RNA is generally detectable in upper respiratory specimens during the acute phase of infection. The lowest concentration of SARS-CoV-2 viral copies this assay can detect is 138 copies/mL. A negative result does not preclude SARS-Cov-2 infection and should not be used as the sole basis for treatment or other patient management decisions. A negative result may occur with  improper specimen collection/handling, submission of specimen other than nasopharyngeal swab, presence of viral mutation(s) within the areas targeted by this assay, and inadequate number of viral copies(<138 copies/mL). A negative result must be combined with clinical observations, patient history, and epidemiological information. The expected result is Negative.  Fact Sheet for Patients:  EntrepreneurPulse.com.au  Fact Sheet for Healthcare Providers:  IncredibleEmployment.be  This test is no t yet approved or cleared by the [place] FDA and  has been authorized for detection and/or diagnosis of SARS-CoV-2 by FDA under an Emergency Use Authorization (EUA). This EUA will remain  in effect (meaning this test can be used) for the duration of the COVID-19 declaration under Section 564(b)(1) of the Act, 21 U.S.C.section 360bbb-3(b)(1), unless the authorization is terminated  or revoked sooner.       Influenza A by PCR NEGATIVE NEGATIVE Final   Influenza B by PCR NEGATIVE NEGATIVE Final    Comment: (NOTE) The Xpert Xpress SARS-CoV-2/FLU/RSV plus assay is intended as an aid in the diagnosis of influenza from Nasopharyngeal swab specimens and should not be used as a sole basis for treatment. Nasal washings  and aspirates are unacceptable for Xpert Xpress SARS-CoV-2/FLU/RSV testing.  Fact Sheet for Patients: EntrepreneurPulse.com.au  Fact Sheet for Healthcare Providers: IncredibleEmployment.be  This test is not yet approved or cleared by the [place] FDA and has been authorized for detection and/or diagnosis of SARS-CoV-2 by FDA under an Emergency Use Authorization (EUA). This EUA will remain in effect (meaning this test can be used) for the duration of the COVID-19 declaration under Section 564(b)(1) of the Act, 21 U.S.C. section 360bbb-3(b)(1), unless the authorization is terminated or revoked.  Performed at  [facility], [place] [address] [place], [place] [address]   Urine culture     Status: Abnormal   Collection Time: 06/25/20  4:30 PM   Specimen: Urine, Random  Result Value Ref Range Status   Specimen Description   Final    URINE, RANDOM Performed at [facility] [address], [place], [place] [address]    Special Requests   Final    NONE Performed at [facility], [place] [address] [place], [place] [address]    Culture >=100,000 COLONIES/mL ESCHERICHIA COLI (A)  Final   Report Status 06/27/2020 FINAL  Final   Organism ID, Bacteria ESCHERICHIA COLI (A)  Final      Susceptibility   Escherichia coli - MIC*    AMPICILLIN 4 SENSITIVE Sensitive     CEFAZOLIN <=4 SENSITIVE Sensitive     CEFEPIME <=0.12 SENSITIVE Sensitive     CEFTRIAXONE <=0.25 SENSITIVE Sensitive     CIPROFLOXACIN <=0.25 SENSITIVE Sensitive     GENTAMICIN <=1 SENSITIVE Sensitive     IMIPENEM <=0.25 SENSITIVE Sensitive     NITROFURANTOIN <=16 SENSITIVE Sensitive     TRIMETH/SULFA <=20 SENSITIVE Sensitive     AMPICILLIN/SULBACTAM <=2 SENSITIVE Sensitive     PIP/TAZO <=4 SENSITIVE Sensitive     * >=100,000 COLONIES/mL ESCHERICHIA COLI  MRSA PCR Screening     Status: Abnormal   Collection Time: 06/26/20 12:53 AM    Specimen: Nasal Mucosa; Nasopharyngeal  Result Value Ref Range Status   MRSA by PCR POSITIVE (A) NEGATIVE Final    Comment:        The GeneXpert MRSA Assay (FDA approved for NASAL specimens only), is one component of a comprehensive MRSA colonization surveillance program. It is not intended to diagnose MRSA infection nor to guide or monitor treatment for MRSA infections. RESULT CALLED TO, READ BACK BY AND VERIFIED WITH: RIBINSON K. 12.04.22 @ 0210 BY MECIAL J. Performed at [facility], [place] [address], [place], [place] [address]   Culture, blood (routine x 2)     Status: None (Preliminary result)   Collection Time: 06/29/20  9:38 AM   Specimen: BLOOD LEFT HAND  Result Value Ref Range Status   Specimen Description   Final    BLOOD LEFT HAND Performed at [facility] [address] [place], [place] [address]    Special Requests   Final    BOTTLES DRAWN AEROBIC AND ANAEROBIC Blood Culture adequate volume Performed at [facility] [address] [place], [place] [address]    Culture   Final    NO GROWTH 4 DAYS Performed at [facility], [place] [address] [place], [place] [address]    Report Status PENDING  Incomplete  Culture, blood (routine x 2)     Status: None (Preliminary result)   Collection Time: 06/29/20  9:42 AM   Specimen: BLOOD LEFT HAND  Result Value Ref Range Status   Specimen Description   Final    BLOOD LEFT HAND Performed at [facility] [address], [place],  [address]    Special Requests   Final    BOTTLES DRAWN AEROBIC ONLY Blood Culture adequate volume Performed at [facility] [address] [place],  [address]    Culture   Final    NO GROWTH 4 DAYS Performed at [facility], [place] [address], [place],  [address]    Report Status PENDING  Incomplete  Culture, Urine     Status: None   Collection Time: 06/29/20  2:58  PM   Specimen: Urine, Random  Result Value Ref Range Status   Specimen Description   Final    URINE, RANDOM Performed at [facility] [address], [place], [place] [address]    Special Requests   Final    NONE Performed at [facility], [place] [address] [place], [place] [address]    Culture   Final    NO GROWTH Performed at [facility], [place] [address], [place], [place] [address]    Report Status 06/30/2020 FINAL  Final  SARS CORONAVIRUS 2 (TAT 6-24 HRS) Nasopharyngeal Nasopharyngeal Swab     Status: None   Collection Time: 07/02/20 11:00 AM   Specimen: Nasopharyngeal Swab  Result Value Ref Range Status   SARS Coronavirus 2 NEGATIVE NEGATIVE Final    Comment: (NOTE) SARS-CoV-2 target nucleic acids are NOT DETECTED.  The SARS-CoV-2 RNA is generally detectable in upper and lower respiratory specimens during the acute phase of infection. Negative results do not preclude SARS-CoV-2 infection, do not rule out co-infections with other pathogens, and should not be used as the sole basis for treatment or other patient management decisions. Negative results must be combined with clinical observations, patient history, and epidemiological information. The expected result is Negative.  Fact Sheet for Patients: SugarRoll.be  Fact Sheet for Healthcare Providers: https://www.woods-mathews.com/  This test is not yet approved or cleared by the [place] FDA and  has been authorized for detection and/or diagnosis of SARS-CoV-2 by FDA under an Emergency Use Authorization (EUA). This EUA will remain  in effect (meaning this test can be used) for the duration of the COVID-19 declaration under Se ction 564(b)(1) of the Act, 21 U.S.C. section 360bbb-3(b)(1), unless the authorization is terminated or revoked sooner.  Performed at [facility], [place] [address], [place], [place] [address]       Time coordinating discharge: 35 minutes  SIGNED:   Cordelia Poche, MD [facility] 07/03/2020, 1:07 PM

## 2020-07-03 NOTE — NC FL2 (Signed)
[place] LEVEL OF CARE SCREENING TOOL     IDENTIFICATION  Patient Name: Betty Chan Birthdate: 16-Jul-1929 Sex: female Admission Date (Current Location): 06/25/2020  [facility] and [place] Number:  Herbalist and Address:  [facility],  [place], [place]      Provider Number: [id number]  Attending Physician Name and Address:  Mariel Aloe, MD  Relative Name and Phone Number:       Current Level of Care: Hospital Recommended Level of Care: Other (Comment) (Long term Care SNF) Prior Approval Number:    Date Approved/Denied:   PASRR Number:    Discharge Plan: Other (Comment) (Long term care SNF)    Current Diagnoses: Patient Active Problem List   Diagnosis Date Noted  . Sepsis ([place]) 06/26/2020  . Acute encephalopathy 06/25/2020  . DNR (do not resuscitate) 06/25/2020  . Urinary tract infection without hematuria   . Dyspnea   . Goals of care, counseling/discussion   . General weakness   . Parkinsonism ([place])   . Palliative care by specialist   . Pressure injury of skin 01/18/2020  . Acute respiratory failure with hypoxia ([place]) 01/17/2020  . Acute exacerbation of CHF (congestive heart failure) ([place]) 01/17/2020  . Gait instability 01/17/2016  . Routine general medical examination at a health care facility 07/20/2015  . Chronic venous insufficiency 01/03/2015  . Elevated troponin 11/24/2014  . Osteoarthritis of right lower extremity 11/13/2014  . Osteopenia 02/13/2014  . Essential hypertension   . History of breast cancer 11/09/2013  . Seizures ([place]) 10/04/2013  . H/O aortic valve replacement TAVR 09/07/2013  . Hypothyroid   . Hyperlipidemia   . PHN (postherpetic neuralgia)   . GERD (gastroesophageal reflux disease)   . OAB (overactive bladder)     Orientation RESPIRATION BLADDER Height & Weight     Self  Normal Incontinent Weight: 77.1 kg Height:  5\' 7"  (170.2 cm)  BEHAVIORAL SYMPTOMS/MOOD  NEUROLOGICAL BOWEL NUTRITION STATUS      Continent Diet (Dysphagia 1 Nectar thick)  AMBULATORY STATUS COMMUNICATION OF NEEDS Skin   Total Care Verbally Other (Comment),PU Stage and Appropriate Care,Bruising (1. Silicone heel dressing to the left heel; change every 3 days  2. Enzymatic debridement ointment to the right heel daily; top with saline moist gauze and foam  3. Prevalon boots bilaterally for offloading and to be sent back to SNF for use.)     PU Stage 3 Dressing:  (See note)                 Personal Care Assistance Level of Assistance  Total care       Total Care Assistance: Maximum assistance   Functional Limitations Info  Sight Sight Info: Impaired        SPECIAL CARE FACTORS FREQUENCY                       Contractures Contractures Info: Not present    Additional Factors Info  Code Status,Allergies Code Status Info: DNR Allergies Info: Amoxicillin, Penicillins           Current Medications (07/03/2020):  This is the current hospital active medication list Current Facility-Administered Medications  Medication Dose Route Frequency Provider Last Rate Last Admin  . acetaminophen (TYLENOL) tablet 650 mg  650 mg Oral Q6H PRN Rise Patience, MD   650 mg at 07/03/20 0529   Or  . acetaminophen (TYLENOL) suppository 650 mg  650 mg Rectal Q6H  PRN Rise Patience, MD      . atorvastatin (LIPITOR) tablet 20 mg  20 mg Oral Q supper Mariel Aloe, MD   20 mg at 07/02/20 1703  . carbidopa-levodopa (SINEMET IR) 25-100 MG per tablet immediate release 1 tablet  1 tablet Oral QHS Nita Sells, MD   1 tablet at 07/02/20 2253  . collagenase (SANTYL) ointment   Topical Daily Mariel Aloe, MD   Given at 07/03/20 1020  . enoxaparin (LOVENOX) injection 40 mg  40 mg Subcutaneous Q24H Rise Patience, MD   40 mg at 07/02/20 2253  . levothyroxine (SYNTHROID) tablet 50 mcg  50 mcg Oral Q0600 Mariel Aloe, MD   50 mcg at 07/03/20 0529  . metoprolol  succinate (TOPROL-XL) 24 hr tablet 12.5 mg  12.5 mg Oral Daily Mariel Aloe, MD   12.5 mg at 07/03/20 1018  . Resource ThickenUp Clear   Oral PRN Mariel Aloe, MD   Given at 06/28/20 2228     Discharge Medications: Please see discharge summary for a list of discharge medications.  Relevant Imaging Results:  Relevant Lab Results:   Additional Information SSN [id number]  Ralph Brouwer, Marjie Skiff, RN

## 2020-07-03 NOTE — Progress Notes (Signed)
RN called report to SNF. Patien'ts IV taken out. Patient's daughter, Madaline Brilliant, was conta ted.

## 2020-07-03 NOTE — Evaluation (Signed)
Occupational Therapy Evaluation Patient Details Name: Betty Chan MRN: [id number] DOB: [birth year] Today's Date: 07/03/2020    History of Present Illness Patient is a 87 y.o. female with a history of severe headache stenosis status post TAVR, chronic systolic heart failure, chronic hypoxia, history of breast cancer status post lumpectomy, progressive supranuclear palsy, HTN, HLD, hypothyroidism TIA, seizure, OA, osteopenia, GERD. Patient presented secondary to worsening mental status with evidence of sepsis and found to have a UTI.   Clinical Impression   Pt from [facility] and typically mod A for transfers to [facility], gets assist for bathing/dressing - but does self feed. Today Pt is total A to come and sit EOB. Pt was able to hold self up and OT washed down back with warm wash cloth and after supported rest break OT applied lotion to back while patient sat EOB. Pt is total A for toilet needs and unable to assess transfer today. Pt should continue skilled OT in the acute setting as well as afterwards at the SNF level to return to PLOF.    Follow Up Recommendations  SNF    Equipment Recommendations       Recommendations for Other Services       Precautions / Restrictions Precautions Precautions: Fall Restrictions Weight Bearing Restrictions: No      Mobility Bed Mobility Overal bed mobility: Needs Assistance Bed Mobility: Supine to Sit;Sit to Supine     Supine to sit: Total assist;+2 for physical assistance Sit to supine: Total assist;+2 for physical assistance   General bed mobility comments: able to complete EOB transfer with total A +2 and use of bed pad, Pt really does try to assist, but ultimately requires total A    Transfers                 General transfer comment: NT this session    Balance Overall balance assessment: Needs assistance Sitting-balance support: Feet supported;Bilateral upper extremity supported Sitting balance-Leahy Scale: Poor Sitting  balance - Comments: pt able to sit without back supported for ~5-10 seconds but requires intermittent assist to stabilize and fatigues quickly requiring posterior support.                                   ADL either performed or assessed with clinical judgement   ADL Overall ADL's : Needs assistance/impaired                                       General ADL Comments: max A for all aspects of ADL and +2 for transfers at this time     Vision         Perception     Praxis      Pertinent Vitals/Pain Pain Assessment: Faces Faces Pain Scale: Hurts a little bit Pain Location: tender heels Pain Descriptors / Indicators: Grimacing Pain Intervention(s): Monitored during session;Repositioned;Other (comment) (elevated heels and prevlon boots applied)     Hand Dominance Right   Extremity/Trunk Assessment Upper Extremity Assessment Upper Extremity Assessment: Generalized weakness;RUE deficits/detail;LUE deficits/detail RUE Deficits / Details: overall strength 2/5 LUE Deficits / Details: overall strength 2/5   Lower Extremity Assessment Lower Extremity Assessment: Defer to PT evaluation RLE Deficits / Details: poor strength throughout (1 to 2-/5) for strength. LLE Deficits / Details: poor strength 1/5 or less throughout.   Cervical / Trunk  Assessment Cervical / Trunk Assessment: Other exceptions Cervical / Trunk Exceptions: cervical Lt torticollis contraction. able to be repositioned to improve alignment in bed.   Communication Communication Communication: No difficulties   Cognition Arousal/Alertness: Awake/alert Behavior During Therapy: Flat affect Overall Cognitive Status: No family/caregiver present to determine baseline cognitive functioning                                 General Comments: Pt answering questions about past, very pleasant and cooperative throughout session   General Comments  Pt 93% SpO2 on RA    Exercises      Shoulder Instructions      Home Living Family/patient expects to be discharged to:: Skilled nursing facility                                        Prior Functioning/Environment Level of Independence: Needs assistance  Gait / Transfers Assistance Needed: (per chart review form 01/2020) pt requires min-mod assist to transfer to w/c and then able to perform w/c mobility ADL's / Homemaking Assistance Needed: (per chart review) pt requires assist for all ADL's, ~ 1 year ago was able to don socks and bra without assist Communication / Swallowing Assistance Needed: soft voice          OT Problem List: Decreased strength;Decreased activity tolerance;Impaired balance (sitting and/or standing)      OT Treatment/Interventions: Self-care/ADL training;Therapeutic exercise;DME and/or AE instruction;Therapeutic activities;Patient/family education;Balance training    OT Goals(Current goals can be found in the care plan section) Acute Rehab OT Goals Patient Stated Goal: none stated "feels good to sit" OT Goal Formulation: With patient Time For Goal Achievement: 07/17/20 Potential to Achieve Goals: Good ADL Goals Pt Will Perform Eating: with set-up Pt Will Perform Grooming: with set-up;sitting Pt Will Transfer to Toilet: with max assist;stand pivot transfer;bedside commode Pt Will Perform Toileting - Clothing Manipulation and hygiene: with max assist;sitting/lateral leans Pt/caregiver will Perform Home Exercise Program: Increased strength;Both right and left upper extremity;With Supervision Additional ADL Goal #1: Pt will perform bed mobility at min A prior to engaging in ADL  OT Frequency: Min 2X/week   Barriers to D/C:            Co-evaluation              AM-PAC OT "6 Clicks" Daily Activity     Outcome Measure Help from another person eating meals?: A Lot Help from another person taking care of personal grooming?: A Lot Help from another person toileting,  which includes using toliet, bedpan, or urinal?: Total Help from another person bathing (including washing, rinsing, drying)?: A Lot Help from another person to put on and taking off regular upper body clothing?: A Lot Help from another person to put on and taking off regular lower body clothing?: Total 6 Click Score: 10   End of Session Nurse Communication: Mobility status  Activity Tolerance: Patient tolerated treatment well Patient left: in bed;with call bell/phone within reach;with bed alarm set  OT Visit Diagnosis: Other abnormalities of gait and mobility (R26.89);Muscle weakness (generalized) (M62.81)                Time: 5009-3818 OT Time Calculation (min): 35 min Charges:  OT General Charges $OT Visit: 1 Visit OT Evaluation $OT Eval Moderate Complexity: 1 Mod OT Treatments $Self Care/Home Management :  8-22 mins  Jesse Sans OTR/L Acute Rehabilitation Services Pager: [phone] Office: [place] 07/03/2020, 11:38 AM

## 2020-07-04 LAB — CULTURE, BLOOD (ROUTINE X 2)
Culture: NO GROWTH
Culture: NO GROWTH
Special Requests: ADEQUATE
Special Requests: ADEQUATE

## 2020-07-17 ENCOUNTER — Telehealth: Payer: Self-pay | Admitting: Neurology

## 2020-07-17 NOTE — Telephone Encounter (Signed)
Received a call from the patient's daughter today.  Patient unresponsive at nursing facility.  Apparently, they had a meeting yesterday at nursing facility regarding goals of care and I was listed as treating neurologist (I have not seen the patient since 2018 and that was only one visit).  Nursing facility suggested that they reach out to me for further advice, but then the patient became unresponsive today.  Daughter was not sure that she wanted patient taken back to the hospital and called me.  I talked to her about hospice care and she was not sure about that.  By the time that the daughter got to the nursing facility, patient was responsive and indicated that she did not want to go to the hospital.  I encouraged the daughter to look into hospice services, just to look at all available options, but also discussed that I could not give much other medical advice given that I had not seen the patient since 2018 and don't really know physical status (besides what was relayed to me).  Note purely for documentation.

## 2020-08-28 ENCOUNTER — Other Ambulatory Visit (HOSPITAL_COMMUNITY): Payer: Self-pay

## 2020-08-28 DIAGNOSIS — R1310 Dysphagia, unspecified: Secondary | ICD-10-CM

## 2020-09-04 ENCOUNTER — Ambulatory Visit (HOSPITAL_COMMUNITY)
Admission: RE | Admit: 2020-09-04 | Discharge: 2020-09-04 | Disposition: A | Discharge status: Home / Self Care | Payer: Medicare Other | Source: Ambulatory Visit | Attending: Internal Medicine | Admitting: Internal Medicine

## 2020-09-04 ENCOUNTER — Encounter (HOSPITAL_COMMUNITY): Payer: Self-pay

## 2020-09-04 ENCOUNTER — Other Ambulatory Visit: Payer: Self-pay

## 2020-09-04 DIAGNOSIS — R1310 Dysphagia, unspecified: Secondary | ICD-10-CM | POA: Insufficient documentation

## 2020-09-04 DIAGNOSIS — R1312 Dysphagia, oropharyngeal phase: Secondary | ICD-10-CM | POA: Insufficient documentation

## 2022-05-23 DEATH — deceased
